# Patient Record
Sex: Male | Born: 1945
Health system: Southern US, Community
[De-identification: ages and names within clinical notes are randomized; demographics above are authoritative.]

## PROBLEM LIST (undated history)

## (undated) DIAGNOSIS — I499 Cardiac arrhythmia, unspecified: Secondary | ICD-10-CM

## (undated) DIAGNOSIS — C4491 Basal cell carcinoma of skin, unspecified: Secondary | ICD-10-CM

## (undated) DIAGNOSIS — E119 Type 2 diabetes mellitus without complications: Secondary | ICD-10-CM

## (undated) DIAGNOSIS — I4891 Unspecified atrial fibrillation: Secondary | ICD-10-CM

## (undated) DIAGNOSIS — I10 Essential (primary) hypertension: Secondary | ICD-10-CM

## (undated) DIAGNOSIS — E785 Hyperlipidemia, unspecified: Secondary | ICD-10-CM

## (undated) HISTORY — DX: Basal cell carcinoma of skin, unspecified: C44.91

## (undated) HISTORY — PX: NASAL RECONSTRUCTION: SHX2069

## (undated) HISTORY — PX: WISDOM TOOTH EXTRACTION: SHX21

## (undated) HISTORY — DX: Essential (primary) hypertension: I10

## (undated) HISTORY — DX: Hyperlipidemia, unspecified: E78.5

## (undated) HISTORY — PX: BASAL CELL CARCINOMA EXCISION: SHX1214

## (undated) HISTORY — PX: INGUINAL HERNIA REPAIR: SUR1180

## (undated) HISTORY — DX: Type 2 diabetes mellitus without complications: E11.9

---

## 1998-09-16 ENCOUNTER — Ambulatory Visit (HOSPITAL_BASED_OUTPATIENT_CLINIC_OR_DEPARTMENT_OTHER): Admission: RE | Admit: 1998-09-16 | Discharge: 1998-09-16 | Payer: Self-pay | Admitting: Surgery

## 2004-02-26 ENCOUNTER — Ambulatory Visit (HOSPITAL_COMMUNITY): Admission: RE | Admit: 2004-02-26 | Discharge: 2004-02-26 | Payer: Self-pay | Admitting: Internal Medicine

## 2004-02-29 ENCOUNTER — Ambulatory Visit (HOSPITAL_COMMUNITY): Admission: RE | Admit: 2004-02-29 | Discharge: 2004-02-29 | Payer: Self-pay | Admitting: Internal Medicine

## 2004-07-03 ENCOUNTER — Ambulatory Visit (HOSPITAL_COMMUNITY): Admission: RE | Admit: 2004-07-03 | Discharge: 2004-07-03 | Payer: Self-pay | Admitting: Internal Medicine

## 2005-04-15 ENCOUNTER — Encounter (HOSPITAL_COMMUNITY): Admission: RE | Admit: 2005-04-15 | Discharge: 2005-05-15 | Payer: Self-pay | Admitting: Neurosurgery

## 2006-08-23 ENCOUNTER — Ambulatory Visit (HOSPITAL_COMMUNITY): Admission: RE | Admit: 2006-08-23 | Discharge: 2006-08-23 | Payer: Self-pay | Admitting: Unknown Physician Specialty

## 2006-08-26 ENCOUNTER — Observation Stay (HOSPITAL_COMMUNITY): Admission: AD | Admit: 2006-08-26 | Discharge: 2006-08-27 | Payer: Self-pay | Admitting: Neurosurgery

## 2006-12-14 HISTORY — PX: LUMBAR FUSION: SHX111

## 2006-12-27 ENCOUNTER — Ambulatory Visit (HOSPITAL_COMMUNITY): Admission: RE | Admit: 2006-12-27 | Discharge: 2006-12-27 | Payer: Self-pay | Admitting: Neurosurgery

## 2007-06-02 ENCOUNTER — Encounter: Admission: RE | Admit: 2007-06-02 | Discharge: 2007-06-02 | Payer: Self-pay | Admitting: Neurosurgery

## 2007-06-15 ENCOUNTER — Ambulatory Visit (HOSPITAL_COMMUNITY): Admission: RE | Admit: 2007-06-15 | Discharge: 2007-06-15 | Payer: Self-pay | Admitting: Family Medicine

## 2007-07-11 ENCOUNTER — Inpatient Hospital Stay (HOSPITAL_COMMUNITY): Admission: RE | Admit: 2007-07-11 | Discharge: 2007-07-15 | Payer: Self-pay | Admitting: Neurosurgery

## 2007-10-07 IMAGING — CR DG LUMBAR SPINE 2-3V
1 series · 1 of 1 positions shown · non-contrast
Comparison: none

CLINICAL DATA: L4-5 herniated nucleus pulposus.
 LUMBAR SPINE ? 2 LATERAL INTRAOPERATIVE IMAGES 07/11/07 AT 1040 HOURS:

[view not recorded]
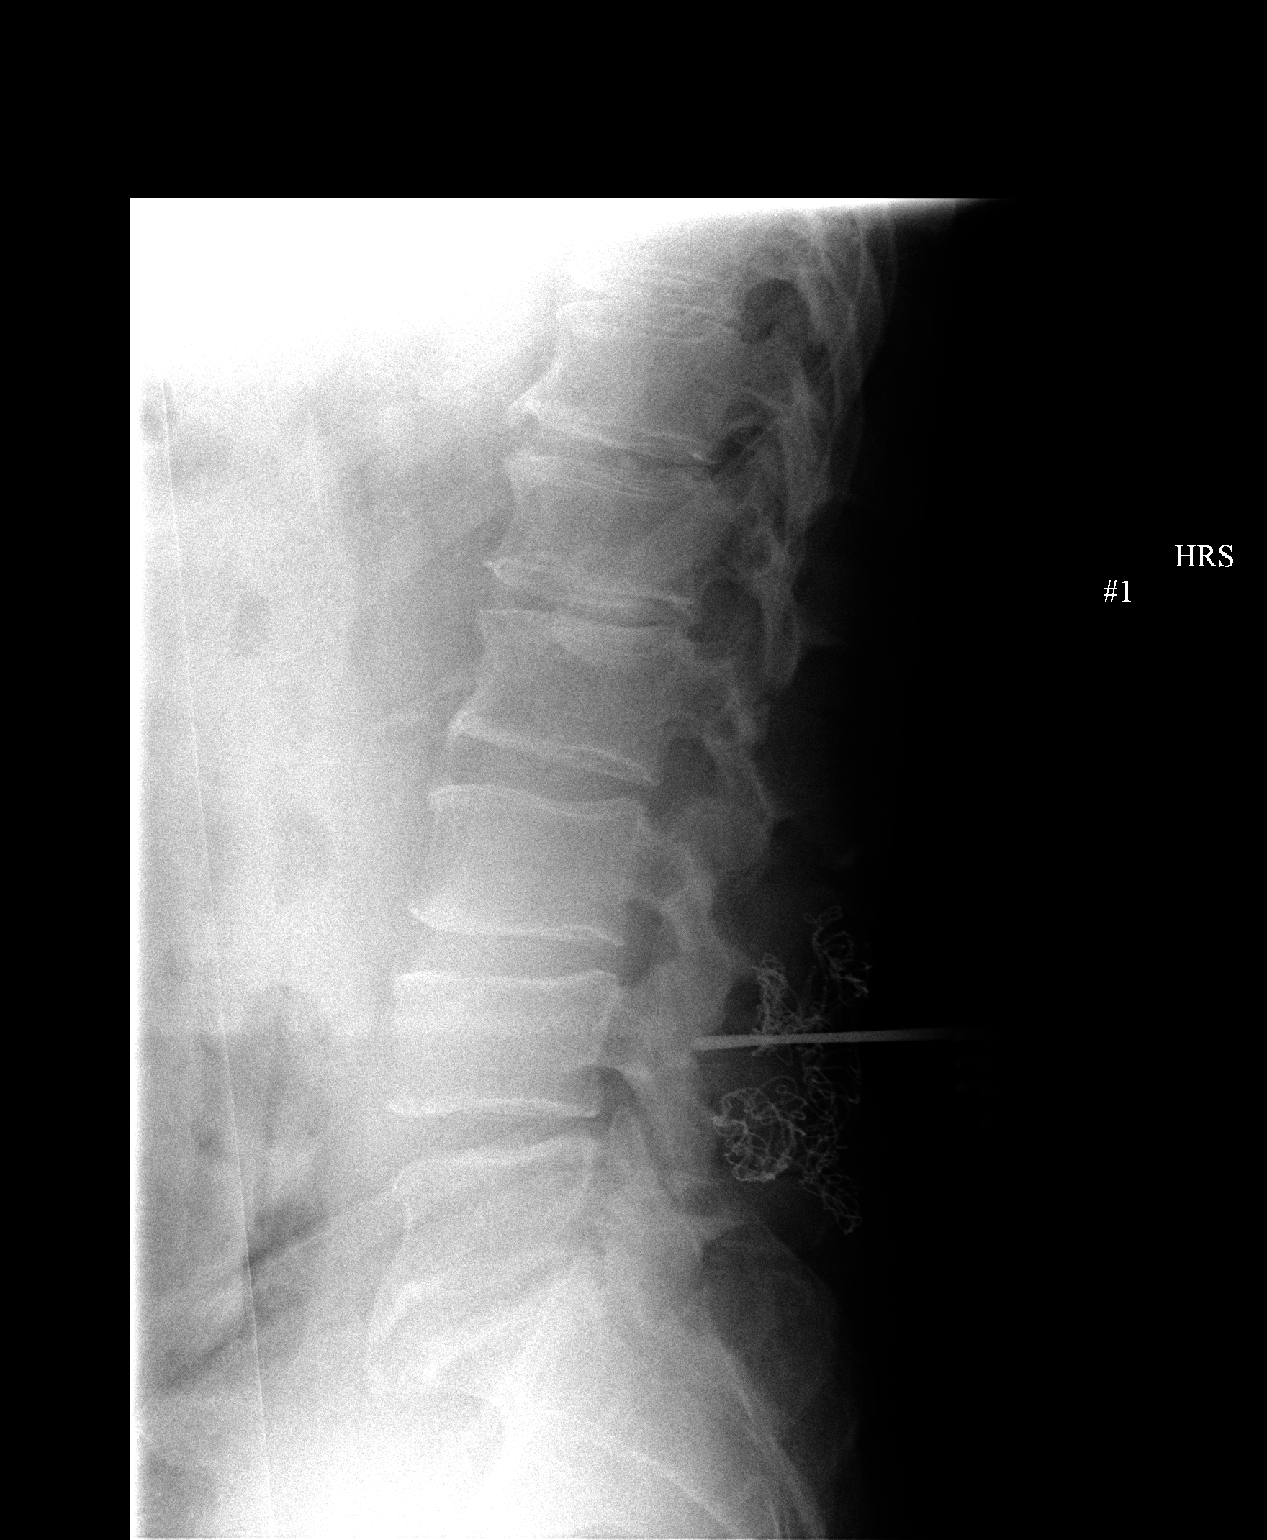

[1 of 1 positions shown; findings below may reference images not displayed]

FINDINGS: On the first film, a surgical instrument has been utilized to marked the L4 pedicle.  
 On the second image, the inferior articular facets of L4 and L5 have been marked by two surgical instruments.  A lap sponge is present in the operative bed.
IMPRESSION: Intraoperative lateral spine as described.

## 2011-04-28 NOTE — Op Note (Signed)
NAME:  Max Davenport, Max Davenport NO.:  [id number]   MEDICAL RECORD NO.:  [id number]          PATIENT TYPE:  INP   LOCATION:  3006                         FACILITY:  [facility]   PHYSICIAN:  Hewitt Shorts, M.D.DATE OF BIRTH:  1946/10/07   DATE OF PROCEDURE:  07/11/2007  DATE OF DISCHARGE:                               OPERATIVE REPORT   PREOPERATIVE DIAGNOSES:  1. Recurrent right L4-5 lumbar disk herniation.  2. Lumbar degenerative disk disease.  3. Lumbar spondylosis.  4. Lumbar radiculopathy.   POSTOPERATIVE DIAGNOSIS:  1. Recurrent right L4-5 lumbar disk herniation.  2. Lumbar degenerative disk disease.  3. Lumbar spondylosis.  4. Lumbar radiculopathy.   PROCEDURE:  Bilateral L4-5 lumbar decompression via lumbar laminotomy,  facetectomy, foraminotomy, and microdiskectomy with microdissection and  bilateral L4-5 post lumbar interbody fusion with AVS peak interbody  implants and VITOSS with bone marrow aspirate and bilateral L4-5  posterior arthrodesis with radius post instrumentation of VITOSS with  bone marrow aspirate.   SURGEON:  Nudelman.   ASSISTANTWebb Silversmith, NP.   ANESTHESIA:  General endotracheal.   INDICATIONS:  Patient is a 65 year old man who presented with recurrent  right L4-5 lumbar disk herniation.  His previous diskectomy was 10  months ago.  We discussed options for microdiskectomy versus  decompression microdiskectomy and arthrodesis.  The patient selected the  lateral approach.   PROCEDURE:  Patient was brought to the operating room and placed under  general endotracheal anesthesia.  Patient was turned to a prone  position.  The lumbar region was prepped with Betadine and saline  solution, draped in a sterile fashion.  The midline was infiltrated with  local anesthetic with epinephrine.  The previous midline incision was  reopened and dissection was carried down through the subcutaneous tissue  using bipolar cautery.  Electrocautery was used  to maintain hemostasis.  Dissection was carried down to the lumbar fascia, which was incised  bilaterally, and the paraspinal muscles were dissected from the spinous  process of the lamina in a subperiosteal fashion.  The L4-5 level was  identified with a x-ray.  Scar tissue within the right L4-5 laminotomy  was noted.  We exposed the left side, including the lamina and  intralaminar space at L4-5 and then laterally over the L4-5 facet  complex, exposing the L4 and L5 transverse processes.  Using  microdissection microsurgical technique with magnification, we carefully  dissected the scar tissue on the right side from the previous  laminectomy defect and then extended the laminotomy rostrally and then  bilateral facetectomies were performed and the neural foramina  decompressed.  We exposed the annulus of the L4-5 disk bilaterally.  As  we freed up the scar tissue in the lateral epidural space and dura and  L4-5, we began to encounter some free fragment disk herniation and this  was progressively mobilized using a microhook and pituitary rongeur.  Then we removed several large fragments of disk that had herniated free  into the epidural space.  We then continued the diskectomy bilaterally  at L4-5 using a variety of microcurets and paddle  rongeurs to prepare  the end plate surfaces, removing the cartilaginous surface, exposing  bony surface bilaterally.  We measured the height of the intervertebral  disk spaces and selected 9 mm implants.  We then exposed the transverse  processes of L4 and L5 on the right side and then probed the left L5  pedicle.  We aspirated 15 ml of bone marrow aspirate, injected over 10  ml triple VITOSS.  We then packed 9 mm in height AVS peak interbody  implants with the VITOSS bone marrow aspirate and then carefully  retracted the thecal sac and nerve root.  We placed the first implant on  the left side.  It was countersunk.  We packed additional VITOSS with   bone marrow aspirate around it and then we carefully retracted the  thecal sac and nerve root on the right side, placing the second implant  on the right side.  We then packed in additional VITOSS with bone marrow  aspirate lateral to that in the intervertebral disk space.  Once this  was completed, the C-arm fluoroscope was draped and brought into the  field to provide additional guidance in placing the pedicle screws, and  each of the pedicles bilaterally at L4 and L5 were probed with a pedicle  probe, examined with a ball probe.  No cutouts were found.  We then  tapped each of the pedicles with a 5.25 mm tap.  Again, examined them  with a ball probe.  No cutouts were found.  We placed 5.75 mm screws,  placing 45 mm screws at L5 and 50 mm screws at L4.  One AP view was  taken.  It was felt that the screws on the left side were too medial  and, therefore, they were removed.  Again using the C-arm fluoroscope,  we selected a more lateral entry point into the pedicle.  The pedicle  was again probed, examined with a ball probe, tapped, again examined  with a ball probe and then the screws placed.  It was felt to be better  positioned within the pedicle.  We then selected 30 mm pre-lordosed  rods.  They were positioned in the screw heads and locked down with  locking caps into position.  Subsequently, tightening was again  encountered, torqued.  We then decorticated the transverse processes at  L4 and L5 bilaterally and packed the additional VITOSS with bone marrow  aspirate over the transverse processes and intertransverse space.  We  then, once again, examined the spinal canal, thecal sac, and nerve roots  to insure that they were well decompressed.  The wound was irrigated  numerous times during the procedure, first with saline solution and  subsequently with bacitracin solution.  Then we proceeded with closure.  The deep fascia was closed with undyed 1 Vicryl sutures, Scarpa's fascia   closed with interrupted inverted undyed 1 Vicryl sutures.  The  subcutaneous subcuticular layer closed with inverted 2-0 undyed Vicryl  sutures and the skin edges were closed with surgical staples.  The wound  was dressed with Dacron and sterile gauze.  Procedure was tolerated  well.  The estimated blood loss was 400 ml.  The sponge and needle count  was correct.  We did use a Cell Saver during the procedure, but the Cell  Saver technician and CRNA felt there was insufficient blood loss to  spend down the collective specimen.  Patient was subsequently turned  back to supine position, reversed the anesthetic, extubated, and  transferred  to the recovery room for further care.      Hewitt Shorts, M.D.  Electronically Signed     RWN/MEDQ  D:  07/11/2007  T:  07/11/2007  Job:  [id number]   cc:   Hewitt Shorts, M.D.

## 2011-04-28 NOTE — H&P (Signed)
NAME:  Max Davenport, Max Davenport NO.:  [id number]   MEDICAL RECORD NO.:  [id number]          PATIENT TYPE:  INP   LOCATION:  3172                         FACILITY:  [facility]   PHYSICIAN:  Hewitt Shorts, M.D.DATE OF BIRTH:  1946/08/07   DATE OF ADMISSION:  07/11/2007  DATE OF DISCHARGE:                              HISTORY & PHYSICAL   HISTORY OF PRESENT ILLNESS:  The patient is a 65 year old right-handed  white male who has been a patient of mine since August 2005.  He is  status post a right L5-S1 lumbar diskectomy 30 years ago by Dr. Jonne Ply, and status post a right L4-5 lumbar diskectomy that I  performed in September of 2007.  Unfortunately, he has developed  recurrent right lumbar radicular pain radiating from his low back down  to the right buttock, posterior thigh, and calf.  He is treated with  oxycodone, cortisone shot, and Lyrica.  He is reevaluated with MRI scan  that revealed a large recurrent right L4-5 lumbar disk herniation.  We  discussed options for lumbar laminotomy, micro diskectomy for  decompression versus diskectomy and placement of posterior lateral  arthrodesis.  The patient preferred the more definitive diskectomy  decompression and arthrodesis.  The patient was brought to surgery now  for such.   PAST MEDICAL HISTORY:  Notable for a recent boil on his right knee that  was found to be due to methicillin resistant Staph aureus and was  treated by his primary physician, and that infection has resolved.  He  does not describe any history of hypertension, myocardial infarction,  cancer, stroke, diabetes, peptic ulcer disease, or lung disease.   His only previous surgeries were his 2 lumbar diskectomies.   HE DENIES ALLERGIES TO MEDICATIONS.   CURRENT MEDICATIONS:  Oxycodone and Septra.   FAMILY HISTORY:  The patient was adopted and there is no information  regarding his birth parents.   SOCIAL HISTORY:  The patient is a Clinical cytogeneticist at [employer].  He is  unmarried.  He does not smoke, drink, or have any history of substance  abuse.   REVIEW OF SYSTEMS:  Notable for symptoms described in the history of  present illness and past medical history.  Review of systems is  otherwise unremarkable.   PHYSICAL EXAMINATION:  GENERAL:  The patient is a well-developed, well-  nourished, white male in no acute distress.  VITAL SIGNS:  His temperature is 97.7, pulse is 76, blood pressure is  137/88, respiratory rate 18, height 5 foot 9 inches, weight 205 pounds.  LUNGS:  Clear to auscultation.  He has symmetrical respiratory  excursion.  HEART:  Regular rate and rhythm.  Normal S1, S2.  There is no murmurs.  ABDOMEN:  Soft, nondistended.  Bowel sounds are present.  EXTREMITY EXAMINATION:  Shows no clubbing, cyanosis, or edema.  MUSCULOSKELETAL EXAMINATION:  Shows tenderness to palpation in the right  paralumbar region, not on the left paralumbar region.  Kernig leg  raising is positive on the right at 70 degrees.  Straight leg raising is  positive.  Positive  cross straight leg raising on the left at about 40-  50  degrees.  NEUROLOGIC EXAMINATION:  Shows 5/5 strength through the lower  extremities including the iliopsoas, quadriceps, dorsiflexor, extensor  hallucis longus and plantar flexor bilaterally.  Sensation is intact to  pinprick in the lower extremities.  Reflexes are 1 to 2+ in the  quadriceps, trace in the gastrocnemius, they are  symmetrical  bilaterally.  Toes downgoing.  Gait and stance favors the right lower  extremity.   IMPRESSION:  Acute right lumbar radiculopathy.  Recurrent  right L4-5  disk herniation.   PLAN:  The patient will be admitted for a bilateral L4-5 lumbar  decompression and posterior arthrodesis with interbody implants, bone  grafting plus instrumentation bone graft.  We discussed alternative  surgeries, alternatives to surgery all together, and the nature of  surgery, typicals of  surgery, hospital stay, and recuperation  and  limitations postoperatively, need for possible immobilization in a  lumbar corset and risks of surgery including risks of infection,  bleeding, possible need for transfusion, risk of nerve disorder, pain,  weakness, numbness or paresthesias, the risk of dural tear and CSF  leakage,  possible need for further surgery, the risk of failure of the  arthrodesis, and anesthetic risks including the possibility of  myocardial infarction, stroke, pneumonia, and death.  After discussing  all this, he wishes to go ahead with surgery and is admitted now for  such.   Preoperatively the patient was treated with a Hibiclens shower last  night and again this morning.  He also used Bactroban nasal  ointment  b.i.d. for the past 5 days.  He will be treated with vancomycin  perioperatively.      Hewitt Shorts, M.D.  Electronically Signed     RWN/MEDQ  D:  07/11/2007  T:  07/11/2007  Job:  [id number]

## 2011-04-28 NOTE — Discharge Summary (Signed)
NAME:  [name], [name] NO.:  [id number]   MEDICAL RECORD NO.:  [id number]          PATIENT TYPE:  INP   LOCATION:  3006                         FACILITY:  [facility]   PHYSICIAN:  Max Davenport, M.D.DATE OF BIRTH:  [birth year]   DATE OF ADMISSION:  07/11/2007  DATE OF DISCHARGE:  07/15/2007                               DISCHARGE SUMMARY   ADMISSION HISTORY AND PHYSICAL EXAMINATION:  The patient is a 65-year-  old man with recurrent right L4-5 lumbar disk herniation.  He developed  recurrent radicular pain and was admitted for lumbar decompression,  diskectomy and arthrodesis.  General examination was unremarkable.  Neurologic examination showed good strength and sensation.   HOSPITAL COURSE:  Patient was admitted.  Underwent L4-5 lumbar  laminotomy, facetectomy, microdiskectomy, posterior lumbar interbody  fusion with PEEK interbody implants and posterolateral arthrodesis with  posterior instrumentation bone graft.  Postoperatively, he has done  well.  He had the persistent drainage from the superior aspect of his  incision for a couple of days.  However, at this point his wound has  healed up.  There is no further drainage.  There is no erythema.  There  is never any purulence to the drainage.  His wound was stapled closed  and he has been given instructions regarding wound care and activities.   DISCHARGE INSTRUCTIONS:  He is to return to our office in five days for  staple removal.  Discharge prescriptions were given for Percocet one or  two q.4-6h. p.r.n. pain, 60 tabs prescribed, no refills, and Flexeril 10  mg q.8h. p.r.n. spasm, 90 tablets prescribed, 1 refill.   DISCHARGE DIAGNOSES:  Recurrent lumbar disk herniation, lumbar  degenerative disk disease, lumbar spondylosis and lumbar radiculopathy.      Max Davenport, M.D.  Electronically Signed     RWN/MEDQ  D:  07/15/2007  T:  07/15/2007  Job:  [id number]

## 2011-05-01 NOTE — Op Note (Signed)
NAME:  BROADY, LAFOY NO.:  [id number]   MEDICAL RECORD NO.:  [id number]          PATIENT TYPE:  INP   LOCATION:  5707                         FACILITY:  [facility]   PHYSICIAN:  Hewitt Shorts, M.D.DATE OF BIRTH:  Oct 03, 1946   DATE OF PROCEDURE:  DATE OF DISCHARGE:                                 OPERATIVE REPORT   PREOPERATIVE DIAGNOSIS:  Right L4-5 lumbar degenerative disk disease.  Lumbar spondylosis, lumbar stenosis and lumbar radiculopathy.   POSTOPERATIVE DIAGNOSIS:  Right L4-5 lumbar degenerative disk disease.  Lumbar spondylosis, lumbar stenosis and lumbar radiculopathy.   PROCEDURE:  Right L4-5 lumbar laminotomy and microdiskectomy with  microdissection.   SURGEON:  Hewitt Shorts, M.D.   ASSISTANT:  Lovell Sheehan   ANESTHESIA:  General endotracheal.   INDICATIONS FOR PROCEDURE:  The patient is a 65 year old man who presented  with an acute right lumbar radiculopathy and MRI revealed a right L4-5  lumbar disk herniation with significant thecal sac and nerve root  compression.  Decision was made to proceed with laminotomy and  microdiskectomy.   PROCEDURE:  The patient was brought to the operating room and placed under  general endotracheal intubation.  The patient was turned to prone position.  Lumbar region was prepped with Betadine soap and solution and draped in  sterile fashion.  The previous midline incision was infiltrated with local  anesthetic with epinephrine and x-ray was taken to localize the L4-5 level  and the incision was made over the L4-5 level and carried down through the  subcutaneous tissue.  Bipolar cautery and electrocautery was used to  maintain hemostasis.  Dissection was carried down to the lumbar fascia which  was incised on the right side of the midline of the paraspinal muscles with  dissection of the spinous process of the lamina, subperiosteal fashion.  A  self-retaining retractor was placed and an x-ray was taken to  identify the  L4-5 level and x-ray was taken to confirm the localization and then the  microscope was draped and brought into the field to provide additional  magnification, illumination and visualization.  The remainder of the  decompression was performed using microdissection and microsurgical  technique.  Laminotomy was performed using the X-MAX drill and Kerrison  punches.  The ligamentum flavum was carefully removed and we identified the  thecal sac and exiting right L5 nerve root.  These structures were then just  gently retracted medially and we exposed significant subligamentous disk  herniation.  The remaining annular fibers were incised and the discectomy  was performed using a variety of pituitary rongeurs.  Good decompression of  the thecal sac and nerve root was achieved and all disk fragments and disk  material were removed.  Once the diskectomy was completed, hemostasis was  established with the use of bipolar cautery.  We then instilled 2 mL of  fentanyl, 80 mg of Depo-Medrol into the epidural space and then proceeded  with closure.  Deep fascia was closed with interrupted #1 Vicryl sutures.  Scarpa's fascia was closed with interrupted and inverted 2-0 undyed Vicryl  suture and  the subcutaneous and subcuticular was closed with inverted 2-0  and 3-0 interrupted Vicryl sutures.  The skin was  approximated with Dermabond.  The procedure was tolerated well.  The  estimated blood loss was less than 25 mL.  Sponge count was correct.  Following surgery the patient was turned back to the supine position to be  reversed anesthetic, extubated and transferred to the recovery room for  further care.      Hewitt Shorts, M.D.  Electronically Signed     RWN/MEDQ  D:  08/26/2006  T:  08/27/2006  Job:  [id number]

## 2011-09-28 LAB — BASIC METABOLIC PANEL
BUN: 11
CO2: 27
Calcium: 9.1
Chloride: 100
Creatinine, Ser: 0.8
GFR calc Af Amer: >60
GFR calc non Af Amer: >60
Glucose, Bld: 82
Potassium: 4.2
Sodium: 134 — ABNORMAL LOW

## 2011-09-28 LAB — URINALYSIS, ROUTINE W REFLEX MICROSCOPIC
Bilirubin Urine: NEGATIVE
Glucose, UA: NEGATIVE
Hgb urine dipstick: NEGATIVE
Ketones, ur: NEGATIVE
Nitrite: NEGATIVE
Protein, ur: NEGATIVE
Specific Gravity, Urine: 1.014
Urobilinogen, UA: 0.2
pH: 7.5

## 2011-09-28 LAB — CBC
HCT: 48.2
Hemoglobin: 16.6
MCHC: 34.5
MCV: 95.4
Platelets: 245
RBC: 5.05
RDW: 12.3
WBC: 6.9

## 2011-09-28 LAB — URINE CULTURE
Colony Count: NO GROWTH
Culture: NO GROWTH
Special Requests: NEGATIVE

## 2011-09-28 LAB — TYPE AND SCREEN
ABO/RH(D): O POS
Antibody Screen: NEGATIVE

## 2011-09-28 LAB — ABO/RH: ABO/RH(D): O POS

## 2011-12-21 DIAGNOSIS — L570 Actinic keratosis: Secondary | ICD-10-CM | POA: Diagnosis not present

## 2011-12-21 DIAGNOSIS — C4441 Basal cell carcinoma of skin of scalp and neck: Secondary | ICD-10-CM | POA: Diagnosis not present

## 2011-12-21 DIAGNOSIS — D235 Other benign neoplasm of skin of trunk: Secondary | ICD-10-CM | POA: Diagnosis not present

## 2012-02-16 DIAGNOSIS — Z85828 Personal history of other malignant neoplasm of skin: Secondary | ICD-10-CM | POA: Diagnosis not present

## 2012-08-22 DIAGNOSIS — Z23 Encounter for immunization: Secondary | ICD-10-CM | POA: Diagnosis not present

## 2012-09-12 DIAGNOSIS — E782 Mixed hyperlipidemia: Secondary | ICD-10-CM | POA: Diagnosis not present

## 2012-09-12 DIAGNOSIS — R5383 Other fatigue: Secondary | ICD-10-CM | POA: Diagnosis not present

## 2012-09-12 DIAGNOSIS — E669 Obesity, unspecified: Secondary | ICD-10-CM | POA: Diagnosis not present

## 2012-09-12 DIAGNOSIS — I10 Essential (primary) hypertension: Secondary | ICD-10-CM | POA: Diagnosis not present

## 2012-09-21 DIAGNOSIS — L989 Disorder of the skin and subcutaneous tissue, unspecified: Secondary | ICD-10-CM | POA: Diagnosis not present

## 2012-09-27 ENCOUNTER — Other Ambulatory Visit (HOSPITAL_COMMUNITY)
Admission: RE | Admit: 2012-09-27 | Discharge: 2012-09-27 | Disposition: A | Discharge status: Home / Self Care | Payer: Medicare Other | Source: Ambulatory Visit | Attending: General Surgery | Admitting: General Surgery

## 2012-09-27 DIAGNOSIS — C4441 Basal cell carcinoma of skin of scalp and neck: Secondary | ICD-10-CM | POA: Diagnosis not present

## 2012-09-27 DIAGNOSIS — L989 Disorder of the skin and subcutaneous tissue, unspecified: Secondary | ICD-10-CM | POA: Diagnosis not present

## 2012-10-11 DIAGNOSIS — C4441 Basal cell carcinoma of skin of scalp and neck: Secondary | ICD-10-CM | POA: Diagnosis not present

## 2012-10-12 DIAGNOSIS — I10 Essential (primary) hypertension: Secondary | ICD-10-CM | POA: Diagnosis not present

## 2012-10-12 DIAGNOSIS — E782 Mixed hyperlipidemia: Secondary | ICD-10-CM | POA: Diagnosis not present

## 2012-10-12 DIAGNOSIS — R946 Abnormal results of thyroid function studies: Secondary | ICD-10-CM | POA: Diagnosis not present

## 2012-10-12 DIAGNOSIS — E669 Obesity, unspecified: Secondary | ICD-10-CM | POA: Diagnosis not present

## 2012-10-27 DIAGNOSIS — J019 Acute sinusitis, unspecified: Secondary | ICD-10-CM | POA: Diagnosis not present

## 2012-11-09 DIAGNOSIS — J019 Acute sinusitis, unspecified: Secondary | ICD-10-CM | POA: Diagnosis not present

## 2012-11-09 DIAGNOSIS — I10 Essential (primary) hypertension: Secondary | ICD-10-CM | POA: Diagnosis not present

## 2012-12-28 DIAGNOSIS — I10 Essential (primary) hypertension: Secondary | ICD-10-CM | POA: Diagnosis not present

## 2012-12-28 DIAGNOSIS — E785 Hyperlipidemia, unspecified: Secondary | ICD-10-CM | POA: Diagnosis not present

## 2012-12-28 DIAGNOSIS — E291 Testicular hypofunction: Secondary | ICD-10-CM | POA: Diagnosis not present

## 2012-12-28 DIAGNOSIS — Z125 Encounter for screening for malignant neoplasm of prostate: Secondary | ICD-10-CM | POA: Diagnosis not present

## 2012-12-28 DIAGNOSIS — R946 Abnormal results of thyroid function studies: Secondary | ICD-10-CM | POA: Diagnosis not present

## 2012-12-28 DIAGNOSIS — E782 Mixed hyperlipidemia: Secondary | ICD-10-CM | POA: Diagnosis not present

## 2013-01-10 DIAGNOSIS — C4441 Basal cell carcinoma of skin of scalp and neck: Secondary | ICD-10-CM | POA: Diagnosis not present

## 2013-01-17 DIAGNOSIS — D45 Polycythemia vera: Secondary | ICD-10-CM | POA: Diagnosis not present

## 2013-01-17 DIAGNOSIS — R7301 Impaired fasting glucose: Secondary | ICD-10-CM | POA: Diagnosis not present

## 2013-03-27 DIAGNOSIS — E119 Type 2 diabetes mellitus without complications: Secondary | ICD-10-CM | POA: Diagnosis not present

## 2013-04-04 DIAGNOSIS — I10 Essential (primary) hypertension: Secondary | ICD-10-CM | POA: Diagnosis not present

## 2013-04-04 DIAGNOSIS — E119 Type 2 diabetes mellitus without complications: Secondary | ICD-10-CM | POA: Diagnosis not present

## 2013-04-04 DIAGNOSIS — E782 Mixed hyperlipidemia: Secondary | ICD-10-CM | POA: Diagnosis not present

## 2013-09-18 DIAGNOSIS — Z23 Encounter for immunization: Secondary | ICD-10-CM | POA: Diagnosis not present

## 2013-12-13 DIAGNOSIS — J069 Acute upper respiratory infection, unspecified: Secondary | ICD-10-CM | POA: Diagnosis not present

## 2014-10-05 DIAGNOSIS — Z23 Encounter for immunization: Secondary | ICD-10-CM | POA: Diagnosis not present

## 2015-08-21 DIAGNOSIS — I10 Essential (primary) hypertension: Secondary | ICD-10-CM | POA: Diagnosis not present

## 2015-09-03 DIAGNOSIS — I10 Essential (primary) hypertension: Secondary | ICD-10-CM | POA: Diagnosis not present

## 2015-09-03 DIAGNOSIS — Z125 Encounter for screening for malignant neoplasm of prostate: Secondary | ICD-10-CM | POA: Diagnosis not present

## 2015-09-03 DIAGNOSIS — E785 Hyperlipidemia, unspecified: Secondary | ICD-10-CM | POA: Diagnosis not present

## 2015-09-03 DIAGNOSIS — R7301 Impaired fasting glucose: Secondary | ICD-10-CM | POA: Diagnosis not present

## 2015-09-06 DIAGNOSIS — I10 Essential (primary) hypertension: Secondary | ICD-10-CM | POA: Diagnosis not present

## 2015-09-06 DIAGNOSIS — E782 Mixed hyperlipidemia: Secondary | ICD-10-CM | POA: Diagnosis not present

## 2015-09-06 DIAGNOSIS — R7301 Impaired fasting glucose: Secondary | ICD-10-CM | POA: Diagnosis not present

## 2015-09-27 DIAGNOSIS — R110 Nausea: Secondary | ICD-10-CM | POA: Diagnosis not present

## 2015-09-27 DIAGNOSIS — J069 Acute upper respiratory infection, unspecified: Secondary | ICD-10-CM | POA: Diagnosis not present

## 2016-03-10 DIAGNOSIS — E291 Testicular hypofunction: Secondary | ICD-10-CM | POA: Diagnosis not present

## 2016-03-10 DIAGNOSIS — E119 Type 2 diabetes mellitus without complications: Secondary | ICD-10-CM | POA: Diagnosis not present

## 2016-03-16 DIAGNOSIS — R7301 Impaired fasting glucose: Secondary | ICD-10-CM | POA: Diagnosis not present

## 2016-03-16 DIAGNOSIS — M545 Low back pain: Secondary | ICD-10-CM | POA: Diagnosis not present

## 2016-03-16 DIAGNOSIS — E782 Mixed hyperlipidemia: Secondary | ICD-10-CM | POA: Diagnosis not present

## 2016-03-16 DIAGNOSIS — I10 Essential (primary) hypertension: Secondary | ICD-10-CM | POA: Diagnosis not present

## 2016-07-24 DIAGNOSIS — E782 Mixed hyperlipidemia: Secondary | ICD-10-CM | POA: Diagnosis not present

## 2016-07-24 DIAGNOSIS — E119 Type 2 diabetes mellitus without complications: Secondary | ICD-10-CM | POA: Diagnosis not present

## 2016-07-24 DIAGNOSIS — E291 Testicular hypofunction: Secondary | ICD-10-CM | POA: Diagnosis not present

## 2016-07-28 DIAGNOSIS — I10 Essential (primary) hypertension: Secondary | ICD-10-CM | POA: Diagnosis not present

## 2016-07-28 DIAGNOSIS — E119 Type 2 diabetes mellitus without complications: Secondary | ICD-10-CM | POA: Diagnosis not present

## 2016-07-28 DIAGNOSIS — E782 Mixed hyperlipidemia: Secondary | ICD-10-CM | POA: Diagnosis not present

## 2016-08-14 ENCOUNTER — Other Ambulatory Visit: Payer: Self-pay

## 2017-04-28 DIAGNOSIS — I10 Essential (primary) hypertension: Secondary | ICD-10-CM | POA: Diagnosis not present

## 2017-04-28 DIAGNOSIS — Z125 Encounter for screening for malignant neoplasm of prostate: Secondary | ICD-10-CM | POA: Diagnosis not present

## 2017-04-28 DIAGNOSIS — E119 Type 2 diabetes mellitus without complications: Secondary | ICD-10-CM | POA: Diagnosis not present

## 2017-04-28 DIAGNOSIS — E291 Testicular hypofunction: Secondary | ICD-10-CM | POA: Diagnosis not present

## 2017-04-30 DIAGNOSIS — I10 Essential (primary) hypertension: Secondary | ICD-10-CM | POA: Diagnosis not present

## 2017-04-30 DIAGNOSIS — E782 Mixed hyperlipidemia: Secondary | ICD-10-CM | POA: Diagnosis not present

## 2017-09-02 DIAGNOSIS — E119 Type 2 diabetes mellitus without complications: Secondary | ICD-10-CM | POA: Diagnosis not present

## 2017-09-02 DIAGNOSIS — I10 Essential (primary) hypertension: Secondary | ICD-10-CM | POA: Diagnosis not present

## 2017-09-06 DIAGNOSIS — E6609 Other obesity due to excess calories: Secondary | ICD-10-CM | POA: Diagnosis not present

## 2017-09-06 DIAGNOSIS — E782 Mixed hyperlipidemia: Secondary | ICD-10-CM | POA: Diagnosis not present

## 2017-09-06 DIAGNOSIS — I10 Essential (primary) hypertension: Secondary | ICD-10-CM | POA: Diagnosis not present

## 2017-09-06 DIAGNOSIS — E119 Type 2 diabetes mellitus without complications: Secondary | ICD-10-CM | POA: Diagnosis not present

## 2017-09-06 DIAGNOSIS — Z6835 Body mass index (BMI) 35.0-35.9, adult: Secondary | ICD-10-CM | POA: Diagnosis not present

## 2017-09-20 DIAGNOSIS — I10 Essential (primary) hypertension: Secondary | ICD-10-CM | POA: Diagnosis not present

## 2017-09-20 DIAGNOSIS — Z6833 Body mass index (BMI) 33.0-33.9, adult: Secondary | ICD-10-CM | POA: Diagnosis not present

## 2017-09-20 DIAGNOSIS — E119 Type 2 diabetes mellitus without complications: Secondary | ICD-10-CM | POA: Diagnosis not present

## 2017-10-13 DIAGNOSIS — J0110 Acute frontal sinusitis, unspecified: Secondary | ICD-10-CM | POA: Diagnosis not present

## 2017-10-13 DIAGNOSIS — R42 Dizziness and giddiness: Secondary | ICD-10-CM | POA: Diagnosis not present

## 2018-01-06 DIAGNOSIS — E6609 Other obesity due to excess calories: Secondary | ICD-10-CM | POA: Diagnosis not present

## 2018-01-06 DIAGNOSIS — R7301 Impaired fasting glucose: Secondary | ICD-10-CM | POA: Diagnosis not present

## 2018-01-06 DIAGNOSIS — I10 Essential (primary) hypertension: Secondary | ICD-10-CM | POA: Diagnosis not present

## 2018-01-06 DIAGNOSIS — E291 Testicular hypofunction: Secondary | ICD-10-CM | POA: Diagnosis not present

## 2018-01-06 DIAGNOSIS — K219 Gastro-esophageal reflux disease without esophagitis: Secondary | ICD-10-CM | POA: Diagnosis not present

## 2018-01-06 DIAGNOSIS — E119 Type 2 diabetes mellitus without complications: Secondary | ICD-10-CM | POA: Diagnosis not present

## 2018-01-06 DIAGNOSIS — E782 Mixed hyperlipidemia: Secondary | ICD-10-CM | POA: Diagnosis not present

## 2018-01-10 DIAGNOSIS — I10 Essential (primary) hypertension: Secondary | ICD-10-CM | POA: Diagnosis not present

## 2018-01-10 DIAGNOSIS — E119 Type 2 diabetes mellitus without complications: Secondary | ICD-10-CM | POA: Diagnosis not present

## 2018-01-10 DIAGNOSIS — E782 Mixed hyperlipidemia: Secondary | ICD-10-CM | POA: Diagnosis not present

## 2018-05-02 DIAGNOSIS — E119 Type 2 diabetes mellitus without complications: Secondary | ICD-10-CM | POA: Diagnosis not present

## 2018-05-02 DIAGNOSIS — Z125 Encounter for screening for malignant neoplasm of prostate: Secondary | ICD-10-CM | POA: Diagnosis not present

## 2018-05-02 DIAGNOSIS — Z79899 Other long term (current) drug therapy: Secondary | ICD-10-CM | POA: Diagnosis not present

## 2018-05-02 DIAGNOSIS — N429 Disorder of prostate, unspecified: Secondary | ICD-10-CM | POA: Diagnosis not present

## 2018-05-02 DIAGNOSIS — E782 Mixed hyperlipidemia: Secondary | ICD-10-CM | POA: Diagnosis not present

## 2018-05-02 DIAGNOSIS — D519 Vitamin B12 deficiency anemia, unspecified: Secondary | ICD-10-CM | POA: Diagnosis not present

## 2018-05-04 DIAGNOSIS — Z0000 Encounter for general adult medical examination without abnormal findings: Secondary | ICD-10-CM | POA: Diagnosis not present

## 2018-05-04 DIAGNOSIS — I10 Essential (primary) hypertension: Secondary | ICD-10-CM | POA: Diagnosis not present

## 2018-05-04 DIAGNOSIS — E782 Mixed hyperlipidemia: Secondary | ICD-10-CM | POA: Diagnosis not present

## 2018-05-04 DIAGNOSIS — E119 Type 2 diabetes mellitus without complications: Secondary | ICD-10-CM | POA: Diagnosis not present

## 2018-09-02 DIAGNOSIS — E782 Mixed hyperlipidemia: Secondary | ICD-10-CM | POA: Diagnosis not present

## 2018-09-02 DIAGNOSIS — E119 Type 2 diabetes mellitus without complications: Secondary | ICD-10-CM | POA: Diagnosis not present

## 2018-09-02 DIAGNOSIS — E291 Testicular hypofunction: Secondary | ICD-10-CM | POA: Diagnosis not present

## 2018-09-02 DIAGNOSIS — I10 Essential (primary) hypertension: Secondary | ICD-10-CM | POA: Diagnosis not present

## 2018-09-06 DIAGNOSIS — Z0000 Encounter for general adult medical examination without abnormal findings: Secondary | ICD-10-CM | POA: Diagnosis not present

## 2018-09-06 DIAGNOSIS — E1165 Type 2 diabetes mellitus with hyperglycemia: Secondary | ICD-10-CM | POA: Diagnosis not present

## 2018-09-06 DIAGNOSIS — I10 Essential (primary) hypertension: Secondary | ICD-10-CM | POA: Diagnosis not present

## 2018-09-06 DIAGNOSIS — Z6834 Body mass index (BMI) 34.0-34.9, adult: Secondary | ICD-10-CM | POA: Diagnosis not present

## 2018-09-06 DIAGNOSIS — E782 Mixed hyperlipidemia: Secondary | ICD-10-CM | POA: Diagnosis not present

## 2018-11-04 DIAGNOSIS — Z23 Encounter for immunization: Secondary | ICD-10-CM | POA: Diagnosis not present

## 2019-01-09 DIAGNOSIS — I10 Essential (primary) hypertension: Secondary | ICD-10-CM | POA: Diagnosis not present

## 2019-01-09 DIAGNOSIS — E1165 Type 2 diabetes mellitus with hyperglycemia: Secondary | ICD-10-CM | POA: Diagnosis not present

## 2019-01-09 DIAGNOSIS — E291 Testicular hypofunction: Secondary | ICD-10-CM | POA: Diagnosis not present

## 2019-01-09 DIAGNOSIS — R7301 Impaired fasting glucose: Secondary | ICD-10-CM | POA: Diagnosis not present

## 2019-01-09 DIAGNOSIS — E782 Mixed hyperlipidemia: Secondary | ICD-10-CM | POA: Diagnosis not present

## 2019-01-09 DIAGNOSIS — E119 Type 2 diabetes mellitus without complications: Secondary | ICD-10-CM | POA: Diagnosis not present

## 2019-01-09 DIAGNOSIS — Z6834 Body mass index (BMI) 34.0-34.9, adult: Secondary | ICD-10-CM | POA: Diagnosis not present

## 2019-01-13 DIAGNOSIS — E6609 Other obesity due to excess calories: Secondary | ICD-10-CM | POA: Diagnosis not present

## 2019-01-13 DIAGNOSIS — Z6835 Body mass index (BMI) 35.0-35.9, adult: Secondary | ICD-10-CM | POA: Diagnosis not present

## 2019-01-13 DIAGNOSIS — E1165 Type 2 diabetes mellitus with hyperglycemia: Secondary | ICD-10-CM | POA: Diagnosis not present

## 2019-01-13 DIAGNOSIS — I10 Essential (primary) hypertension: Secondary | ICD-10-CM | POA: Diagnosis not present

## 2019-01-13 DIAGNOSIS — E782 Mixed hyperlipidemia: Secondary | ICD-10-CM | POA: Diagnosis not present

## 2019-02-01 DIAGNOSIS — I10 Essential (primary) hypertension: Secondary | ICD-10-CM | POA: Diagnosis not present

## 2019-02-01 DIAGNOSIS — E782 Mixed hyperlipidemia: Secondary | ICD-10-CM | POA: Diagnosis not present

## 2019-02-01 DIAGNOSIS — E1165 Type 2 diabetes mellitus with hyperglycemia: Secondary | ICD-10-CM | POA: Diagnosis not present

## 2019-02-21 DIAGNOSIS — J069 Acute upper respiratory infection, unspecified: Secondary | ICD-10-CM | POA: Diagnosis not present

## 2019-04-12 DIAGNOSIS — E782 Mixed hyperlipidemia: Secondary | ICD-10-CM | POA: Diagnosis not present

## 2019-04-12 DIAGNOSIS — E1165 Type 2 diabetes mellitus with hyperglycemia: Secondary | ICD-10-CM | POA: Diagnosis not present

## 2019-04-12 DIAGNOSIS — I10 Essential (primary) hypertension: Secondary | ICD-10-CM | POA: Diagnosis not present

## 2019-04-20 DIAGNOSIS — E1165 Type 2 diabetes mellitus with hyperglycemia: Secondary | ICD-10-CM | POA: Diagnosis not present

## 2019-04-20 DIAGNOSIS — E782 Mixed hyperlipidemia: Secondary | ICD-10-CM | POA: Diagnosis not present

## 2019-04-20 DIAGNOSIS — I10 Essential (primary) hypertension: Secondary | ICD-10-CM | POA: Diagnosis not present

## 2019-05-17 DIAGNOSIS — I10 Essential (primary) hypertension: Secondary | ICD-10-CM | POA: Diagnosis not present

## 2019-05-17 DIAGNOSIS — E782 Mixed hyperlipidemia: Secondary | ICD-10-CM | POA: Diagnosis not present

## 2019-05-17 DIAGNOSIS — E1165 Type 2 diabetes mellitus with hyperglycemia: Secondary | ICD-10-CM | POA: Diagnosis not present

## 2019-06-05 DIAGNOSIS — I10 Essential (primary) hypertension: Secondary | ICD-10-CM | POA: Diagnosis not present

## 2019-06-05 DIAGNOSIS — Z6834 Body mass index (BMI) 34.0-34.9, adult: Secondary | ICD-10-CM | POA: Diagnosis not present

## 2019-06-05 DIAGNOSIS — E291 Testicular hypofunction: Secondary | ICD-10-CM | POA: Diagnosis not present

## 2019-06-05 DIAGNOSIS — E782 Mixed hyperlipidemia: Secondary | ICD-10-CM | POA: Diagnosis not present

## 2019-06-05 DIAGNOSIS — E119 Type 2 diabetes mellitus without complications: Secondary | ICD-10-CM | POA: Diagnosis not present

## 2019-06-05 DIAGNOSIS — E1165 Type 2 diabetes mellitus with hyperglycemia: Secondary | ICD-10-CM | POA: Diagnosis not present

## 2019-06-05 DIAGNOSIS — R7301 Impaired fasting glucose: Secondary | ICD-10-CM | POA: Diagnosis not present

## 2019-06-09 DIAGNOSIS — E1165 Type 2 diabetes mellitus with hyperglycemia: Secondary | ICD-10-CM | POA: Diagnosis not present

## 2019-06-09 DIAGNOSIS — I10 Essential (primary) hypertension: Secondary | ICD-10-CM | POA: Diagnosis not present

## 2019-06-09 DIAGNOSIS — D751 Secondary polycythemia: Secondary | ICD-10-CM | POA: Diagnosis not present

## 2019-06-09 DIAGNOSIS — E6609 Other obesity due to excess calories: Secondary | ICD-10-CM | POA: Diagnosis not present

## 2019-06-09 DIAGNOSIS — E782 Mixed hyperlipidemia: Secondary | ICD-10-CM | POA: Diagnosis not present

## 2019-06-14 DIAGNOSIS — L03818 Cellulitis of other sites: Secondary | ICD-10-CM | POA: Diagnosis not present

## 2019-06-14 DIAGNOSIS — W57XXXA Bitten or stung by nonvenomous insect and other nonvenomous arthropods, initial encounter: Secondary | ICD-10-CM | POA: Diagnosis not present

## 2019-06-15 DIAGNOSIS — I10 Essential (primary) hypertension: Secondary | ICD-10-CM | POA: Diagnosis not present

## 2019-06-15 DIAGNOSIS — E782 Mixed hyperlipidemia: Secondary | ICD-10-CM | POA: Diagnosis not present

## 2019-06-15 DIAGNOSIS — E1165 Type 2 diabetes mellitus with hyperglycemia: Secondary | ICD-10-CM | POA: Diagnosis not present

## 2019-07-17 ENCOUNTER — Other Ambulatory Visit: Payer: Self-pay

## 2019-08-14 DIAGNOSIS — E1165 Type 2 diabetes mellitus with hyperglycemia: Secondary | ICD-10-CM | POA: Diagnosis not present

## 2019-08-14 DIAGNOSIS — I10 Essential (primary) hypertension: Secondary | ICD-10-CM | POA: Diagnosis not present

## 2019-08-14 DIAGNOSIS — E782 Mixed hyperlipidemia: Secondary | ICD-10-CM | POA: Diagnosis not present

## 2019-10-03 DIAGNOSIS — E291 Testicular hypofunction: Secondary | ICD-10-CM | POA: Diagnosis not present

## 2019-10-03 DIAGNOSIS — R7301 Impaired fasting glucose: Secondary | ICD-10-CM | POA: Diagnosis not present

## 2019-10-03 DIAGNOSIS — Z125 Encounter for screening for malignant neoplasm of prostate: Secondary | ICD-10-CM | POA: Diagnosis not present

## 2019-10-03 DIAGNOSIS — R350 Frequency of micturition: Secondary | ICD-10-CM | POA: Diagnosis not present

## 2019-10-03 DIAGNOSIS — E782 Mixed hyperlipidemia: Secondary | ICD-10-CM | POA: Diagnosis not present

## 2019-10-03 DIAGNOSIS — E119 Type 2 diabetes mellitus without complications: Secondary | ICD-10-CM | POA: Diagnosis not present

## 2019-10-03 DIAGNOSIS — E1165 Type 2 diabetes mellitus with hyperglycemia: Secondary | ICD-10-CM | POA: Diagnosis not present

## 2019-10-03 DIAGNOSIS — I10 Essential (primary) hypertension: Secondary | ICD-10-CM | POA: Diagnosis not present

## 2019-10-06 DIAGNOSIS — Z23 Encounter for immunization: Secondary | ICD-10-CM | POA: Diagnosis not present

## 2019-10-06 DIAGNOSIS — D751 Secondary polycythemia: Secondary | ICD-10-CM | POA: Diagnosis not present

## 2019-10-06 DIAGNOSIS — E6609 Other obesity due to excess calories: Secondary | ICD-10-CM | POA: Diagnosis not present

## 2019-10-06 DIAGNOSIS — E782 Mixed hyperlipidemia: Secondary | ICD-10-CM | POA: Diagnosis not present

## 2019-10-06 DIAGNOSIS — E1165 Type 2 diabetes mellitus with hyperglycemia: Secondary | ICD-10-CM | POA: Diagnosis not present

## 2019-10-06 DIAGNOSIS — Z0000 Encounter for general adult medical examination without abnormal findings: Secondary | ICD-10-CM | POA: Diagnosis not present

## 2019-10-06 DIAGNOSIS — I10 Essential (primary) hypertension: Secondary | ICD-10-CM | POA: Diagnosis not present

## 2019-10-12 DIAGNOSIS — E1165 Type 2 diabetes mellitus with hyperglycemia: Secondary | ICD-10-CM | POA: Diagnosis not present

## 2019-10-12 DIAGNOSIS — E782 Mixed hyperlipidemia: Secondary | ICD-10-CM | POA: Diagnosis not present

## 2019-10-12 DIAGNOSIS — I10 Essential (primary) hypertension: Secondary | ICD-10-CM | POA: Diagnosis not present

## 2019-10-17 ENCOUNTER — Other Ambulatory Visit: Payer: Self-pay

## 2019-10-17 ENCOUNTER — Other Ambulatory Visit (HOSPITAL_COMMUNITY): Payer: Self-pay | Admitting: Adult Health Nurse Practitioner

## 2019-10-17 ENCOUNTER — Ambulatory Visit (HOSPITAL_COMMUNITY)
Admission: RE | Admit: 2019-10-17 | Discharge: 2019-10-17 | Disposition: A | Discharge status: Home / Self Care | Payer: Medicare Other | Source: Ambulatory Visit | Attending: Adult Health Nurse Practitioner | Admitting: Adult Health Nurse Practitioner

## 2019-10-17 DIAGNOSIS — R1084 Generalized abdominal pain: Secondary | ICD-10-CM

## 2019-10-17 DIAGNOSIS — K5900 Constipation, unspecified: Secondary | ICD-10-CM | POA: Diagnosis not present

## 2019-10-17 DIAGNOSIS — K5909 Other constipation: Secondary | ICD-10-CM

## 2019-10-17 DIAGNOSIS — R109 Unspecified abdominal pain: Secondary | ICD-10-CM | POA: Diagnosis not present

## 2019-12-21 DIAGNOSIS — I10 Essential (primary) hypertension: Secondary | ICD-10-CM | POA: Diagnosis not present

## 2019-12-21 DIAGNOSIS — E7849 Other hyperlipidemia: Secondary | ICD-10-CM | POA: Diagnosis not present

## 2019-12-21 DIAGNOSIS — E1165 Type 2 diabetes mellitus with hyperglycemia: Secondary | ICD-10-CM | POA: Diagnosis not present

## 2020-02-13 DIAGNOSIS — Z23 Encounter for immunization: Secondary | ICD-10-CM | POA: Diagnosis not present

## 2020-03-12 DIAGNOSIS — Z23 Encounter for immunization: Secondary | ICD-10-CM | POA: Diagnosis not present

## 2020-04-05 DIAGNOSIS — J0110 Acute frontal sinusitis, unspecified: Secondary | ICD-10-CM | POA: Diagnosis not present

## 2020-04-05 DIAGNOSIS — E291 Testicular hypofunction: Secondary | ICD-10-CM | POA: Diagnosis not present

## 2020-04-05 DIAGNOSIS — E7849 Other hyperlipidemia: Secondary | ICD-10-CM | POA: Diagnosis not present

## 2020-04-05 DIAGNOSIS — E119 Type 2 diabetes mellitus without complications: Secondary | ICD-10-CM | POA: Diagnosis not present

## 2020-04-05 DIAGNOSIS — E6609 Other obesity due to excess calories: Secondary | ICD-10-CM | POA: Diagnosis not present

## 2020-04-05 DIAGNOSIS — F411 Generalized anxiety disorder: Secondary | ICD-10-CM | POA: Diagnosis not present

## 2020-04-05 DIAGNOSIS — K219 Gastro-esophageal reflux disease without esophagitis: Secondary | ICD-10-CM | POA: Diagnosis not present

## 2020-04-05 DIAGNOSIS — E782 Mixed hyperlipidemia: Secondary | ICD-10-CM | POA: Diagnosis not present

## 2020-04-05 DIAGNOSIS — J069 Acute upper respiratory infection, unspecified: Secondary | ICD-10-CM | POA: Diagnosis not present

## 2020-04-05 DIAGNOSIS — I10 Essential (primary) hypertension: Secondary | ICD-10-CM | POA: Diagnosis not present

## 2020-04-05 DIAGNOSIS — E1165 Type 2 diabetes mellitus with hyperglycemia: Secondary | ICD-10-CM | POA: Diagnosis not present

## 2020-04-05 DIAGNOSIS — D751 Secondary polycythemia: Secondary | ICD-10-CM | POA: Diagnosis not present

## 2020-04-10 DIAGNOSIS — Z6830 Body mass index (BMI) 30.0-30.9, adult: Secondary | ICD-10-CM | POA: Diagnosis not present

## 2020-04-10 DIAGNOSIS — E782 Mixed hyperlipidemia: Secondary | ICD-10-CM | POA: Diagnosis not present

## 2020-04-10 DIAGNOSIS — E1165 Type 2 diabetes mellitus with hyperglycemia: Secondary | ICD-10-CM | POA: Diagnosis not present

## 2020-04-10 DIAGNOSIS — D751 Secondary polycythemia: Secondary | ICD-10-CM | POA: Diagnosis not present

## 2020-04-10 DIAGNOSIS — I10 Essential (primary) hypertension: Secondary | ICD-10-CM | POA: Diagnosis not present

## 2020-04-10 DIAGNOSIS — R944 Abnormal results of kidney function studies: Secondary | ICD-10-CM | POA: Diagnosis not present

## 2020-04-10 DIAGNOSIS — K5900 Constipation, unspecified: Secondary | ICD-10-CM | POA: Diagnosis not present

## 2020-05-01 DIAGNOSIS — E7849 Other hyperlipidemia: Secondary | ICD-10-CM | POA: Diagnosis not present

## 2020-05-01 DIAGNOSIS — E119 Type 2 diabetes mellitus without complications: Secondary | ICD-10-CM | POA: Diagnosis not present

## 2020-05-01 DIAGNOSIS — J069 Acute upper respiratory infection, unspecified: Secondary | ICD-10-CM | POA: Diagnosis not present

## 2020-05-01 DIAGNOSIS — E782 Mixed hyperlipidemia: Secondary | ICD-10-CM | POA: Diagnosis not present

## 2020-05-01 DIAGNOSIS — E1165 Type 2 diabetes mellitus with hyperglycemia: Secondary | ICD-10-CM | POA: Diagnosis not present

## 2020-05-01 DIAGNOSIS — K219 Gastro-esophageal reflux disease without esophagitis: Secondary | ICD-10-CM | POA: Diagnosis not present

## 2020-05-01 DIAGNOSIS — E6609 Other obesity due to excess calories: Secondary | ICD-10-CM | POA: Diagnosis not present

## 2020-05-01 DIAGNOSIS — F411 Generalized anxiety disorder: Secondary | ICD-10-CM | POA: Diagnosis not present

## 2020-05-01 DIAGNOSIS — D751 Secondary polycythemia: Secondary | ICD-10-CM | POA: Diagnosis not present

## 2020-05-01 DIAGNOSIS — E291 Testicular hypofunction: Secondary | ICD-10-CM | POA: Diagnosis not present

## 2020-05-01 DIAGNOSIS — I10 Essential (primary) hypertension: Secondary | ICD-10-CM | POA: Diagnosis not present

## 2020-05-01 DIAGNOSIS — J0110 Acute frontal sinusitis, unspecified: Secondary | ICD-10-CM | POA: Diagnosis not present

## 2020-08-12 DIAGNOSIS — J069 Acute upper respiratory infection, unspecified: Secondary | ICD-10-CM | POA: Diagnosis not present

## 2020-08-12 DIAGNOSIS — E7849 Other hyperlipidemia: Secondary | ICD-10-CM | POA: Diagnosis not present

## 2020-08-12 DIAGNOSIS — E782 Mixed hyperlipidemia: Secondary | ICD-10-CM | POA: Diagnosis not present

## 2020-08-12 DIAGNOSIS — R7301 Impaired fasting glucose: Secondary | ICD-10-CM | POA: Diagnosis not present

## 2020-08-12 DIAGNOSIS — E291 Testicular hypofunction: Secondary | ICD-10-CM | POA: Diagnosis not present

## 2020-08-12 DIAGNOSIS — W57XXXA Bitten or stung by nonvenomous insect and other nonvenomous arthropods, initial encounter: Secondary | ICD-10-CM | POA: Diagnosis not present

## 2020-08-12 DIAGNOSIS — K219 Gastro-esophageal reflux disease without esophagitis: Secondary | ICD-10-CM | POA: Diagnosis not present

## 2020-08-12 DIAGNOSIS — L03818 Cellulitis of other sites: Secondary | ICD-10-CM | POA: Diagnosis not present

## 2020-08-12 DIAGNOSIS — E1165 Type 2 diabetes mellitus with hyperglycemia: Secondary | ICD-10-CM | POA: Diagnosis not present

## 2020-08-12 DIAGNOSIS — I10 Essential (primary) hypertension: Secondary | ICD-10-CM | POA: Diagnosis not present

## 2020-08-12 DIAGNOSIS — E6609 Other obesity due to excess calories: Secondary | ICD-10-CM | POA: Diagnosis not present

## 2020-08-12 DIAGNOSIS — Z6834 Body mass index (BMI) 34.0-34.9, adult: Secondary | ICD-10-CM | POA: Diagnosis not present

## 2020-08-16 DIAGNOSIS — E782 Mixed hyperlipidemia: Secondary | ICD-10-CM | POA: Diagnosis not present

## 2020-08-16 DIAGNOSIS — L989 Disorder of the skin and subcutaneous tissue, unspecified: Secondary | ICD-10-CM | POA: Diagnosis not present

## 2020-08-16 DIAGNOSIS — Z23 Encounter for immunization: Secondary | ICD-10-CM | POA: Diagnosis not present

## 2020-08-16 DIAGNOSIS — I129 Hypertensive chronic kidney disease with stage 1 through stage 4 chronic kidney disease, or unspecified chronic kidney disease: Secondary | ICD-10-CM | POA: Diagnosis not present

## 2020-08-16 DIAGNOSIS — E1165 Type 2 diabetes mellitus with hyperglycemia: Secondary | ICD-10-CM | POA: Diagnosis not present

## 2020-08-16 DIAGNOSIS — Z0001 Encounter for general adult medical examination with abnormal findings: Secondary | ICD-10-CM | POA: Diagnosis not present

## 2020-08-16 DIAGNOSIS — E1122 Type 2 diabetes mellitus with diabetic chronic kidney disease: Secondary | ICD-10-CM | POA: Diagnosis not present

## 2020-08-16 DIAGNOSIS — D751 Secondary polycythemia: Secondary | ICD-10-CM | POA: Diagnosis not present

## 2020-08-16 DIAGNOSIS — E6609 Other obesity due to excess calories: Secondary | ICD-10-CM | POA: Diagnosis not present

## 2020-08-16 DIAGNOSIS — N1832 Chronic kidney disease, stage 3b: Secondary | ICD-10-CM | POA: Diagnosis not present

## 2020-08-21 ENCOUNTER — Other Ambulatory Visit: Payer: Self-pay | Admitting: Gastroenterology

## 2020-08-21 DIAGNOSIS — Z85828 Personal history of other malignant neoplasm of skin: Secondary | ICD-10-CM | POA: Diagnosis not present

## 2020-08-21 DIAGNOSIS — D485 Neoplasm of uncertain behavior of skin: Secondary | ICD-10-CM | POA: Diagnosis not present

## 2020-08-21 DIAGNOSIS — C4441 Basal cell carcinoma of skin of scalp and neck: Secondary | ICD-10-CM | POA: Diagnosis not present

## 2020-09-02 DIAGNOSIS — I129 Hypertensive chronic kidney disease with stage 1 through stage 4 chronic kidney disease, or unspecified chronic kidney disease: Secondary | ICD-10-CM | POA: Diagnosis not present

## 2020-09-02 DIAGNOSIS — C44310 Basal cell carcinoma of skin of unspecified parts of face: Secondary | ICD-10-CM | POA: Diagnosis not present

## 2020-09-02 DIAGNOSIS — M542 Cervicalgia: Secondary | ICD-10-CM | POA: Diagnosis not present

## 2020-09-02 DIAGNOSIS — C4441 Basal cell carcinoma of skin of scalp and neck: Secondary | ICD-10-CM | POA: Diagnosis not present

## 2020-09-09 DIAGNOSIS — I129 Hypertensive chronic kidney disease with stage 1 through stage 4 chronic kidney disease, or unspecified chronic kidney disease: Secondary | ICD-10-CM | POA: Diagnosis not present

## 2020-09-11 DIAGNOSIS — M9901 Segmental and somatic dysfunction of cervical region: Secondary | ICD-10-CM | POA: Diagnosis not present

## 2020-09-11 DIAGNOSIS — M542 Cervicalgia: Secondary | ICD-10-CM | POA: Diagnosis not present

## 2020-09-13 DIAGNOSIS — M9901 Segmental and somatic dysfunction of cervical region: Secondary | ICD-10-CM | POA: Diagnosis not present

## 2020-09-13 DIAGNOSIS — M542 Cervicalgia: Secondary | ICD-10-CM | POA: Diagnosis not present

## 2020-09-16 DIAGNOSIS — M9901 Segmental and somatic dysfunction of cervical region: Secondary | ICD-10-CM | POA: Diagnosis not present

## 2020-09-16 DIAGNOSIS — M542 Cervicalgia: Secondary | ICD-10-CM | POA: Diagnosis not present

## 2020-09-18 DIAGNOSIS — M9901 Segmental and somatic dysfunction of cervical region: Secondary | ICD-10-CM | POA: Diagnosis not present

## 2020-09-18 DIAGNOSIS — M542 Cervicalgia: Secondary | ICD-10-CM | POA: Diagnosis not present

## 2020-09-23 DIAGNOSIS — Z01818 Encounter for other preprocedural examination: Secondary | ICD-10-CM | POA: Diagnosis not present

## 2020-09-24 DIAGNOSIS — C44310 Basal cell carcinoma of skin of unspecified parts of face: Secondary | ICD-10-CM | POA: Insufficient documentation

## 2020-09-24 DIAGNOSIS — C4441 Basal cell carcinoma of skin of scalp and neck: Secondary | ICD-10-CM | POA: Diagnosis not present

## 2020-09-26 DIAGNOSIS — Z481 Encounter for planned postprocedural wound closure: Secondary | ICD-10-CM | POA: Diagnosis not present

## 2020-09-26 DIAGNOSIS — Z483 Aftercare following surgery for neoplasm: Secondary | ICD-10-CM | POA: Diagnosis not present

## 2020-09-26 DIAGNOSIS — C4441 Basal cell carcinoma of skin of scalp and neck: Secondary | ICD-10-CM | POA: Diagnosis not present

## 2020-09-26 DIAGNOSIS — C44319 Basal cell carcinoma of skin of other parts of face: Secondary | ICD-10-CM | POA: Diagnosis not present

## 2020-09-26 DIAGNOSIS — Z85828 Personal history of other malignant neoplasm of skin: Secondary | ICD-10-CM | POA: Diagnosis not present

## 2020-10-03 ENCOUNTER — Ambulatory Visit: Payer: Medicare Other | Admitting: Cardiology

## 2020-10-03 ENCOUNTER — Telehealth: Payer: Self-pay

## 2020-10-03 NOTE — Telephone Encounter (Signed)
NOTES ON FILE FROM  DR Allyn Kenner , SENT REFERRAL TO [facility]

## 2020-10-08 DIAGNOSIS — Z23 Encounter for immunization: Secondary | ICD-10-CM | POA: Diagnosis not present

## 2020-10-25 ENCOUNTER — Encounter: Payer: Self-pay | Admitting: Cardiology

## 2020-10-25 ENCOUNTER — Other Ambulatory Visit: Payer: Self-pay

## 2020-10-25 ENCOUNTER — Ambulatory Visit (INDEPENDENT_AMBULATORY_CARE_PROVIDER_SITE_OTHER): Payer: Medicare Other | Admitting: Cardiology

## 2020-10-25 VITALS — BP 138/84 | HR 88 | Ht 69.0 in | Wt 208.0 lb

## 2020-10-25 DIAGNOSIS — I4819 Other persistent atrial fibrillation: Secondary | ICD-10-CM | POA: Diagnosis not present

## 2020-10-25 DIAGNOSIS — I10 Essential (primary) hypertension: Secondary | ICD-10-CM

## 2020-10-25 DIAGNOSIS — E782 Mixed hyperlipidemia: Secondary | ICD-10-CM

## 2020-10-25 DIAGNOSIS — I4891 Unspecified atrial fibrillation: Secondary | ICD-10-CM

## 2020-10-25 MED ORDER — METOPROLOL SUCCINATE ER 25 MG PO TB24: 12.5000 mg | ORAL_TABLET | Freq: Every day | ORAL | 3 refills | Status: DC

## 2020-10-25 NOTE — Progress Notes (Signed)
Cardiology Office Note  Date: 10/25/2020   ID: DAVIDMICHAEL ZARAZUA, DOB October 28, 1946, MRN [id number]  PCP:  Celene Squibb, MD  Cardiologist:  Rozann Lesches, MD Electrophysiologist:  None   Chief Complaint  Patient presents with  . Atrial Fibrillation    History of Present Illness: Max Davenport is a 74 y.o. male referred for cardiology consultation by Dr. Nevada Crane for the evaluation of atrial fibrillation.  I reviewed the available records.  He is here today with a family member.  My understanding is that approximately 1 month ago he underwent Mohs surgery for treatment of skin cancer on the top of his head.  This was a fairly wide excision, he had skin grafting done subsequently.  He states that he was told that during the time that he underwent his initial operation he was found to be in atrial fibrillation.  I do not have any tracings from this outpatient procedure, nothing from his PCP office.  He states that he is not aware of any sense of palpitations, no prior diagnosis of atrial arrhythmias.  I personally reviewed his ECG today which shows atrial fibrillation at 101 bpm, borderline low voltage, poor R wave progression anteriorly, nonspecific T wave changes.  CHA2DS2-VASc score is 3.  We did discuss stroke prophylaxis with DOAC.  The wound on the top of his head is still healing, it is dressed today.  He continues to follow with his surgeon, plans to be seen for a follow-up visit next week.  I reviewed his medications which are outlined below.  Past Medical History:  Diagnosis Date  . Essential hypertension   . Hyperlipidemia   . Skin cancer, basal cell   . Type 2 diabetes mellitus ([place])     Past Surgical History:  Procedure Laterality Date  . BASAL CELL CARCINOMA EXCISION     Top of head  . INGUINAL HERNIA REPAIR Right   . LUMBAR FUSION  2008  . NASAL RECONSTRUCTION    . WISDOM TOOTH EXTRACTION      Current Outpatient Medications  Medication Sig Dispense Refill  .  atorvastatin (LIPITOR) 20 MG tablet Take 20 mg by mouth daily.    Marland Kitchen ezetimibe (ZETIA) 10 MG tablet Take 10 mg by mouth daily.    Marland Kitchen losartan (COZAAR) 50 MG tablet Take 50 mg by mouth in the morning and at bedtime.    . metFORMIN (GLUCOPHAGE) 500 MG tablet Take 500 mg by mouth daily.    . metoprolol succinate (TOPROL XL) 25 MG 24 hr tablet Take 0.5 tablets (12.5 mg total) by mouth daily. 45 tablet 3   No current facility-administered medications for this visit.   Allergies:  Patient has no known allergies.   Social History: The patient  reports that he has never smoked. His smokeless tobacco use includes chew. He reports that he does not drink alcohol and does not use drugs.   Family History: The patient's family history is not on file. He was adopted.   ROS: No palpitations or syncope.  Physical Exam: VS:  BP 138/84 (BP Location: Right Arm)   Pulse 88   Ht 5\' 9"  (1.753 m)   Wt 208 lb (94.3 kg)   BMI 30.72 kg/m , BMI Body mass index is 30.72 kg/m.  Wt Readings from Last 3 Encounters:  10/25/20 208 lb (94.3 kg)    General: Patient appears comfortable at rest. HEENT: Conjunctiva and lids normal, wearing a mask. Neck: Supple, no elevated JVP or carotid bruits,  no thyromegaly. Lungs: Clear to auscultation, nonlabored breathing at rest. Cardiac: Irregularly irregular, no S3 or significant systolic murmur, no pericardial rub. Abdomen: Soft, nontender, bowel sounds present. Extremities: No pitting edema, distal pulses 2+. Skin: Warm and dry. Musculoskeletal: No kyphosis. Neuropsychiatric: Alert and oriented x3, affect grossly appropriate.  ECG:  No old tracing for review today.  Recent Labwork:  August 2021: Hemoglobin 19.0, platelets 178, BUN 21, creatinine 1.55, potassium 5.0, AST 21, ALT 16, cholesterol 145, triglycerides 154, HDL 47, LDL 73, hemoglobin A1c 5.9%  Other Studies Reviewed Today:  No prior cardiac testing for review today.  Assessment and Plan:  1.   Persistent atrial fibrillation, absolute duration is uncertain, but reportedly documented approximately 1 month ago.  He does not report any major sense of palpitations, heart rate around 100 by ECG today.  CHA2DS2-VASc score is 3.  Plan is to initiate Toprol-XL 12.5 mg daily, obtain echocardiogram to assess cardiac structure and function.  Would hold off initiation of DOAC until his skin graft area heals completely, would not want to precipitate any bleeding or breakdown.  We will bring him back in the next 4 to 6 weeks for reevaluation.  2.  Essential hypertension, systolic is in the 546T today.  He continues on losartan.  3.  Mixed hyperlipidemia, on Lipitor and Zetia.  Most recent LDL 73.  Medication Adjustments/Labs and Tests Ordered: Current medicines are reviewed at length with the patient today.  Concerns regarding medicines are outlined above.   Tests Ordered: Orders Placed This Encounter  Procedures  . EKG 12-Lead  . ECHOCARDIOGRAM COMPLETE    Medication Changes: Meds ordered this encounter  Medications  . metoprolol succinate (TOPROL XL) 25 MG 24 hr tablet    Sig: Take 0.5 tablets (12.5 mg total) by mouth daily.    Dispense:  45 tablet    Refill:  3    Disposition:  Follow up 4 to 6 weeks in the [place] office.  Signed, Satira Sark, MD, [facility] 10/25/2020 2:18 PM    [facility] [address] [place], [place] [address] Phone: [phone]; Fax: [phone]

## 2020-10-25 NOTE — Patient Instructions (Signed)
Medication Instructions:  START Toprol XL 12.5 mg daily   *If you need a refill on your cardiac medications before your next appointment, please call your pharmacy*   Lab Work: None today If you have labs (blood work) drawn today and your tests are completely normal, you will receive your results only by: Marland Kitchen MyChart Message (if you have MyChart) OR . A paper copy in the mail If you have any lab test that is abnormal or we need to change your treatment, we will call you to review the results.   Testing/Procedures: Your physician has requested that you have an echocardiogram. Echocardiography is a painless test that uses sound waves to create images of your heart. It provides your doctor with information about the size and shape of your heart and how well your heart's chambers and valves are working. This procedure takes approximately one hour. There are no restrictions for this procedure.     Follow-Up: At [facility], you and your health needs are our priority.  As part of our continuing mission to provide you with exceptional heart care, we have created designated Provider Care Teams.  These Care Teams include your primary Cardiologist (physician) and Advanced Practice Providers (APPs -  Physician Assistants and Nurse Practitioners) who all work together to provide you with the care you need, when you need it.  We recommend signing up for the patient portal called "MyChart".  Sign up information is provided on this After Visit Summary.  MyChart is used to connect with patients for Virtual Visits (Telemedicine).  Patients are able to view lab/test results, encounter notes, upcoming appointments, etc.  Non-urgent messages can be sent to your provider as well.   To learn more about what you can do with MyChart, go to [email or web address].    Your next appointment:   6 week(s)  The format for your next appointment:   In Person  Provider:   Rozann Lesches, MD, Bernerd Pho,  PA-C or Ermalinda Barrios, PA-C   Other Instructions None    Thank you for choosing [facility] !

## 2020-10-29 ENCOUNTER — Ambulatory Visit (HOSPITAL_COMMUNITY)
Admission: RE | Admit: 2020-10-29 | Discharge: 2020-10-29 | Disposition: A | Discharge status: Home / Self Care | Payer: Medicare Other | Source: Ambulatory Visit | Attending: Cardiology | Admitting: Cardiology

## 2020-10-29 ENCOUNTER — Other Ambulatory Visit: Payer: Self-pay

## 2020-10-29 DIAGNOSIS — I4819 Other persistent atrial fibrillation: Secondary | ICD-10-CM

## 2020-10-29 LAB — ECHOCARDIOGRAM COMPLETE
Area-P 1/2: 3.2 cm2
S' Lateral: 2.1 cm

## 2020-10-29 NOTE — Progress Notes (Signed)
*  PRELIMINARY RESULTS* Echocardiogram 2D Echocardiogram has been performed.  Samuel Germany 10/29/2020, 4:01 PM

## 2020-11-14 ENCOUNTER — Ambulatory Visit: Payer: Medicare Other | Attending: Internal Medicine

## 2020-11-14 DIAGNOSIS — Z23 Encounter for immunization: Secondary | ICD-10-CM

## 2020-11-14 NOTE — Progress Notes (Signed)
   Covid-19 Vaccination Clinic  Name:  SALEM LEMBKE    MRN: [id number] DOB: 01/29/1946  11/14/2020  Mr. Misuraca was observed post Covid-19 immunization for 15 minutes without incident. He was provided with Vaccine Information Sheet and instruction to access the V-Safe system.   Mr. Citro was instructed to call 911 with any severe reactions post vaccine: Marland Kitchen Difficulty breathing  . Swelling of face and throat  . A fast heartbeat  . A bad rash all over body  . Dizziness and weakness   Immunizations Administered    No immunizations on file.

## 2020-12-20 DIAGNOSIS — E7849 Other hyperlipidemia: Secondary | ICD-10-CM | POA: Diagnosis not present

## 2020-12-20 DIAGNOSIS — L03818 Cellulitis of other sites: Secondary | ICD-10-CM | POA: Diagnosis not present

## 2020-12-20 DIAGNOSIS — W57XXXA Bitten or stung by nonvenomous insect and other nonvenomous arthropods, initial encounter: Secondary | ICD-10-CM | POA: Diagnosis not present

## 2020-12-20 DIAGNOSIS — R7301 Impaired fasting glucose: Secondary | ICD-10-CM | POA: Diagnosis not present

## 2020-12-20 DIAGNOSIS — E6609 Other obesity due to excess calories: Secondary | ICD-10-CM | POA: Diagnosis not present

## 2020-12-20 DIAGNOSIS — K219 Gastro-esophageal reflux disease without esophagitis: Secondary | ICD-10-CM | POA: Diagnosis not present

## 2020-12-20 DIAGNOSIS — E291 Testicular hypofunction: Secondary | ICD-10-CM | POA: Diagnosis not present

## 2020-12-20 DIAGNOSIS — I10 Essential (primary) hypertension: Secondary | ICD-10-CM | POA: Diagnosis not present

## 2020-12-20 DIAGNOSIS — E1122 Type 2 diabetes mellitus with diabetic chronic kidney disease: Secondary | ICD-10-CM | POA: Diagnosis not present

## 2020-12-20 DIAGNOSIS — J069 Acute upper respiratory infection, unspecified: Secondary | ICD-10-CM | POA: Diagnosis not present

## 2020-12-20 DIAGNOSIS — Z6834 Body mass index (BMI) 34.0-34.9, adult: Secondary | ICD-10-CM | POA: Diagnosis not present

## 2020-12-20 DIAGNOSIS — E1165 Type 2 diabetes mellitus with hyperglycemia: Secondary | ICD-10-CM | POA: Diagnosis not present

## 2020-12-23 ENCOUNTER — Ambulatory Visit: Payer: Medicare Other | Admitting: Cardiology

## 2020-12-25 ENCOUNTER — Ambulatory Visit (INDEPENDENT_AMBULATORY_CARE_PROVIDER_SITE_OTHER): Payer: Medicare Other | Admitting: Cardiology

## 2020-12-25 ENCOUNTER — Encounter: Payer: Self-pay | Admitting: *Deleted

## 2020-12-25 ENCOUNTER — Encounter: Payer: Self-pay | Admitting: Cardiology

## 2020-12-25 VITALS — BP 112/90 | HR 84 | Ht 69.0 in | Wt 216.0 lb

## 2020-12-25 DIAGNOSIS — I4819 Other persistent atrial fibrillation: Secondary | ICD-10-CM | POA: Diagnosis not present

## 2020-12-25 DIAGNOSIS — I10 Essential (primary) hypertension: Secondary | ICD-10-CM

## 2020-12-25 NOTE — Progress Notes (Signed)
Cardiology Office Note  Date: 12/25/2020   ID: Max, Davenport [birth year], MRN [id number]  PCP:  Max Squibb, MD  Cardiologist:  Max Lesches, MD Electrophysiologist:  None   Chief Complaint  Patient presents with  . Cardiac follow-up    History of Present Illness: Max Davenport is a 75 y.o. male seen in consultation back in November 2021.  He presents for a follow-up visit, still reports no sense of palpitations.  He has healed well after Mohs surgery on the top of his head.  Echocardiogram from November 2021 reported LVEF 65 to 70%, normal RV contraction and estimated PASP, moderate biatrial enlargement, mild aortic regurgitation with mildly sclerotic aortic valve.  He had recent lab work with Dr. Nevada Davenport, we are requesting the results for review.  I talked with him about initiation of DOAC and otherwise continuation of current dose of beta-blocker.  I personally reviewed his ECG today which shows rate controlled atrial fibrillation with low voltage.  Past Medical History:  Diagnosis Date  . Essential hypertension   . Hyperlipidemia   . Skin cancer, basal cell   . Type 2 diabetes mellitus ([place])     Past Surgical History:  Procedure Laterality Date  . BASAL CELL CARCINOMA EXCISION     Top of head  . INGUINAL HERNIA REPAIR Right   . LUMBAR FUSION  2008  . NASAL RECONSTRUCTION    . WISDOM TOOTH EXTRACTION      Current Outpatient Medications  Medication Sig Dispense Refill  . atorvastatin (LIPITOR) 20 MG tablet Take 20 mg by mouth daily.    Marland Kitchen ezetimibe (ZETIA) 10 MG tablet Take 10 mg by mouth daily.    Marland Kitchen losartan (COZAAR) 50 MG tablet Take 50 mg by mouth in the morning and at bedtime.    . metFORMIN (GLUCOPHAGE) 500 MG tablet Take 500 mg by mouth daily.    . metoprolol succinate (TOPROL XL) 25 MG 24 hr tablet Take 0.5 tablets (12.5 mg total) by mouth daily. 45 tablet 3   No current facility-administered medications for this visit.   Allergies:  Patient has no  known allergies.   ROS: No palpitations or dizziness.  Physical Exam: VS:  BP 112/90   Pulse 84   Ht 5\' 9"  (1.753 m)   Wt 216 lb (98 kg)   SpO2 96%   BMI 31.90 kg/m , BMI Body mass index is 31.9 kg/m.  Wt Readings from Last 3 Encounters:  12/25/20 216 lb (98 kg)  10/25/20 208 lb (94.3 kg)    General: Patient appears comfortable at rest. HEENT: Conjunctiva and lids normal, wearing a mask. Neck: Supple, no elevated JVP or carotid bruits, no thyromegaly. Lungs: Clear to auscultation, nonlabored breathing at rest. Cardiac: Irregularly irregular, no S3 or significant systolic murmur, no pericardial rub. Extremities: No pitting edema.  ECG:  An ECG dated 10/25/2020 was personally reviewed today and demonstrated:  Atrial fibrillation with borderline low voltage, poor R wave progression, nonspecific T wave changes.  Recent Labwork:  August 2021: Hemoglobin 19.0, platelets 178, BUN 21, creatinine 1.55, potassium 5.0, AST 21, ALT 16, cholesterol 145, triglycerides 154, HDL 47, LDL 73, hemoglobin A1c 5.9%  Other Studies Reviewed Today:  Echocardiogram 10/29/2020: 1. Left ventricular ejection fraction, by estimation, is 65 to 70%. The  left ventricle has normal function. The left ventricle has no regional  wall motion abnormalities. Left ventricular diastolic parameters are  indeterminate.  2. Right ventricular systolic function is normal. The right  ventricular  size is normal. There is normal pulmonary artery systolic pressure.  3. Left atrial size was moderately dilated.  4. Right atrial size was moderately dilated.  5. The mitral valve is abnormal. Trivial mitral valve regurgitation.  6. The aortic valve is abnormal. Aortic valve regurgitation is mild. Mild  aortic valve sclerosis is present, with no evidence of aortic valve  stenosis.  7. The inferior vena cava is normal in size with greater than 50%  respiratory variability, suggesting right atrial pressure of 3 mmHg.    Assessment and Plan:  1. Persistent atrial fibrillation of uncertain duration, relatively asymptomatic at this point and tolerating low-dose beta-blocker.  CHA2DS2-VASc score is 3.  We will request lab work from Max Davenport office and likely initiate Eliquis for stroke prophylaxis.  2. Essential hypertension, systolic 097 today.  Continue current dose of losartan.  Medication Adjustments/Labs and Tests Ordered: Current medicines are reviewed at length with the patient today.  Concerns regarding medicines are outlined above.   Tests Ordered: Orders Placed This Encounter  Procedures  . EKG 12-Lead    Medication Changes: No orders of the defined types were placed in this encounter.   Disposition:  Follow up 3 months in the [place] office.  Signed, Satira Sark, MD, [facility] 12/25/2020 12:24 PM    [facility] [place], [place], [place] [address] Phone: [phone]; Fax: [phone]

## 2020-12-25 NOTE — Patient Instructions (Addendum)
Medication Instructions:  Your physician recommends that you continue on your current medications as directed. Please refer to the Current Medication list given to you today.   Labwork: None  Testing/Procedures: None  Follow-Up: Your physician recommends that you schedule a follow-up appointment in: 3 months  Any Other Special Instructions Will Be Listed Below (If Applicable).     If you need a refill on your cardiac medications before your next appointment, please call your pharmacy.   

## 2021-01-08 DIAGNOSIS — C44310 Basal cell carcinoma of skin of unspecified parts of face: Secondary | ICD-10-CM | POA: Diagnosis not present

## 2021-01-10 ENCOUNTER — Encounter: Payer: Self-pay | Admitting: Cardiology

## 2021-01-14 ENCOUNTER — Telehealth: Payer: Self-pay | Admitting: *Deleted

## 2021-01-14 DIAGNOSIS — I4819 Other persistent atrial fibrillation: Secondary | ICD-10-CM

## 2021-01-14 DIAGNOSIS — Z79899 Other long term (current) drug therapy: Secondary | ICD-10-CM

## 2021-01-14 MED ORDER — APIXABAN 5 MG PO TABS: 5.0000 mg | ORAL_TABLET | Freq: Two times a day (BID) | ORAL | 6 refills | Status: DC

## 2021-01-14 NOTE — Telephone Encounter (Signed)
Patient informed, agrees and verbalized understanding of plan. Lab orders faxed to [facility] Patient will stop by [place] office to pick up eliquis voucher

## 2021-01-14 NOTE — Telephone Encounter (Signed)
-----   Message from Satira Sark, MD sent at 01/12/2021  6:00 PM EST ----- Results reviewed.  Lab work noted from Dr. Nevada Crane.  As per recent office note, would initiate Eliquis at 5 mg twice daily.  Check follow-up CBC and BMET for next office visit scheduled in 3 months.

## 2021-01-15 ENCOUNTER — Encounter: Payer: Self-pay | Admitting: *Deleted

## 2021-01-15 ENCOUNTER — Telehealth: Payer: Self-pay | Admitting: *Deleted

## 2021-01-15 DIAGNOSIS — I4891 Unspecified atrial fibrillation: Secondary | ICD-10-CM | POA: Insufficient documentation

## 2021-01-15 DIAGNOSIS — I4819 Other persistent atrial fibrillation: Secondary | ICD-10-CM | POA: Insufficient documentation

## 2021-01-15 MED ORDER — RIVAROXABAN 15 MG PO TABS: 15.0000 mg | ORAL_TABLET | Freq: Every day | ORAL | 11 refills | Status: DC

## 2021-01-15 NOTE — Telephone Encounter (Signed)
I have noticed this with a number of insurance providers since the beginning of the year.  We can certainly start with Xarelto instead.  Based on his lab work from Dr. Nevada Crane, creatinine clearance is 57 by standard methods, 48 when adjusted for BMI.  Would suggest starting Xarelto 15 mg daily for now and then plan to repeat lab work as already arranged per prior discussion.

## 2021-01-15 NOTE — Telephone Encounter (Signed)
Received denial of prior auth. Of Eliquis. States that pt must have tried and failed warfarin or Xarelto. Please advise.

## 2021-01-15 NOTE — Telephone Encounter (Signed)
Pt notified of denial and states that he with complete current supply of Eliquis then state he will start Xarelto.

## 2021-03-31 ENCOUNTER — Ambulatory Visit: Payer: Medicare Other | Admitting: Cardiology

## 2021-04-07 DIAGNOSIS — I4819 Other persistent atrial fibrillation: Secondary | ICD-10-CM | POA: Diagnosis not present

## 2021-04-07 DIAGNOSIS — Z79899 Other long term (current) drug therapy: Secondary | ICD-10-CM | POA: Diagnosis not present

## 2021-04-07 LAB — BASIC METABOLIC PANEL
BUN/Creatinine Ratio: 18 (calc) (ref 6–22)
BUN: 27 mg/dL — ABNORMAL HIGH (ref 7–25)
CO2: 28 mmol/L (ref 20–32)
Calcium: 9.5 mg/dL (ref 8.6–10.3)
Chloride: 105 mmol/L (ref 98–110)
Creat: 1.48 mg/dL — ABNORMAL HIGH (ref 0.70–1.18)
Glucose, Bld: 177 mg/dL — ABNORMAL HIGH (ref 65–99)
Potassium: 4.8 mmol/L (ref 3.5–5.3)
Sodium: 140 mmol/L (ref 135–146)

## 2021-04-07 LAB — CBC
HCT: 50.7 % — ABNORMAL HIGH (ref 38.5–50.0)
Hemoglobin: 17.6 g/dL — ABNORMAL HIGH (ref 13.2–17.1)
MCH: 33.8 pg — ABNORMAL HIGH (ref 27.0–33.0)
MCHC: 34.7 g/dL (ref 32.0–36.0)
MCV: 97.5 fL (ref 80.0–100.0)
MPV: 10.2 fL (ref 7.5–12.5)
Platelets: 179 10*3/uL (ref 140–400)
RBC: 5.2 10*6/uL (ref 4.20–5.80)
RDW: 11.8 % (ref 11.0–15.0)
WBC: 6.3 10*3/uL (ref 3.8–10.8)

## 2021-04-08 ENCOUNTER — Telehealth: Payer: Self-pay | Admitting: *Deleted

## 2021-04-08 NOTE — Telephone Encounter (Signed)
-----   Message from Satira Sark, MD sent at 04/07/2021  3:50 PM EDT ----- Results reviewed.  Creatinine 1.48 and potassium normal, relatively stable findings.  Continue with current medications and follow-up plan.

## 2021-04-08 NOTE — Telephone Encounter (Signed)
-----   Message from Satira Sark, MD sent at 04/07/2021  3:17 PM EDT ----- Results reviewed.  Hemoglobin 17.6, normal platelet count.  No change in current regimen.

## 2021-04-08 NOTE — Telephone Encounter (Signed)
Patient informed. 

## 2021-04-10 NOTE — Progress Notes (Signed)
Cardiology Office Note  Date: 04/11/2021   ID: Sloan, Takagi [birth year], MRN [id number]  PCP:  Celene Squibb, MD  Cardiologist:  Rozann Lesches, MD Electrophysiologist:  None   Chief Complaint  Patient presents with  . Cardiac follow-up    History of Present Illness: Max Davenport is a 75 y.o. male last seen in January.  He is here for a routine visit.  Doing well with atrial fibrillation, no symptoms to include palpitations or chest discomfort.  He tries to stay active with exercise on a regular basis.  I reviewed his recent lab work as noted below.  He does not describe any spontaneous bleeding problems.  Blood pressure elevated today, he does have an automatic cuff and has been following it at home.  He reports compliance with his medications noted below.  Past Medical History:  Diagnosis Date  . Essential hypertension   . Hyperlipidemia   . Skin cancer, basal cell   . Type 2 diabetes mellitus ([place])     Past Surgical History:  Procedure Laterality Date  . BASAL CELL CARCINOMA EXCISION     Top of head  . INGUINAL HERNIA REPAIR Right   . LUMBAR FUSION  2008  . NASAL RECONSTRUCTION    . WISDOM TOOTH EXTRACTION      Current Outpatient Medications  Medication Sig Dispense Refill  . atorvastatin (LIPITOR) 20 MG tablet Take 20 mg by mouth daily.    Marland Kitchen ezetimibe (ZETIA) 10 MG tablet Take 10 mg by mouth daily.    Marland Kitchen losartan (COZAAR) 100 MG tablet Take 100 mg by mouth daily.    . metFORMIN (GLUCOPHAGE) 500 MG tablet Take 500 mg by mouth daily.    . metoprolol succinate (TOPROL XL) 25 MG 24 hr tablet Take 0.5 tablets (12.5 mg total) by mouth daily. 45 tablet 3  . Rivaroxaban (XARELTO) 15 MG TABS tablet Take 1 tablet (15 mg total) by mouth daily with supper. 30 tablet 11   No current facility-administered medications for this visit.   Allergies:  Patient has no known allergies.   ROS: No orthopnea or PND.  Physical Exam: VS:  BP (!) 146/82   Pulse 92   Ht 5'  9.5" (1.765 m)   Wt 225 lb (102.1 kg)   SpO2 98%   BMI 32.75 kg/m , BMI Body mass index is 32.75 kg/m.  Wt Readings from Last 3 Encounters:  04/11/21 225 lb (102.1 kg)  12/25/20 216 lb (98 kg)  10/25/20 208 lb (94.3 kg)    General: Patient appears comfortable at rest. HEENT: Conjunctiva and lids normal, wearing a mask. Neck: Supple, no elevated JVP or carotid bruits, no thyromegaly. Lungs: Clear to auscultation, nonlabored breathing at rest. Cardiac: Irregularly irregular, no S3 or significant systolic murmur, no pericardial rub. Extremities: No pitting edema.  ECG:  An ECG dated 12/25/2020 was personally reviewed today and demonstrated:  Rate controlled atrial fibrillation with low voltage.  Recent Labwork: 04/07/2021: BUN 27; Creat 1.48; Hemoglobin 17.6; Platelets 179; Potassium 4.8; Sodium 140   Other Studies Reviewed Today:  Echocardiogram 10/29/2020: 1. Left ventricular ejection fraction, by estimation, is 65 to 70%. The  left ventricle has normal function. The left ventricle has no regional  wall motion abnormalities. Left ventricular diastolic parameters are  indeterminate.  2. Right ventricular systolic function is normal. The right ventricular  size is normal. There is normal pulmonary artery systolic pressure.  3. Left atrial size was moderately dilated.  4. Right  atrial size was moderately dilated.  5. The mitral valve is abnormal. Trivial mitral valve regurgitation.  6. The aortic valve is abnormal. Aortic valve regurgitation is mild. Mild  aortic valve sclerosis is present, with no evidence of aortic valve  stenosis.  7. The inferior vena cava is normal in size with greater than 50%  respiratory variability, suggesting right atrial pressure of 3 mmHg.   Assessment and Plan:  1.  Permanent atrial fibrillation.  CHA2DS2-VASc score is 3.  Heart rate well controlled on current dose of Toprol-XL.  He is on renally adjusted dose Xarelto for stroke prophylaxis.   Recent lab work reviewed.  2.  Essential hypertension, blood pressure up today.  He reports compliance with his medications, also including Cozaar.  Continue to track with automatic cuff at home.  Medication Adjustments/Labs and Tests Ordered: Current medicines are reviewed at length with the patient today.  Concerns regarding medicines are outlined above.   Tests Ordered: No orders of the defined types were placed in this encounter.   Medication Changes: No orders of the defined types were placed in this encounter.   Disposition:  Follow up 6 months.  Signed, Satira Sark, MD, [facility] 04/11/2021 2:42 PM    [facility] [address] [place], [place] [address] Phone: [phone]; Fax: [phone]

## 2021-04-11 ENCOUNTER — Other Ambulatory Visit: Payer: Self-pay

## 2021-04-11 ENCOUNTER — Encounter: Payer: Self-pay | Admitting: Cardiology

## 2021-04-11 ENCOUNTER — Ambulatory Visit (INDEPENDENT_AMBULATORY_CARE_PROVIDER_SITE_OTHER): Payer: Medicare Other | Admitting: Cardiology

## 2021-04-11 VITALS — BP 146/82 | HR 92 | Ht 69.5 in | Wt 225.0 lb

## 2021-04-11 DIAGNOSIS — I4819 Other persistent atrial fibrillation: Secondary | ICD-10-CM

## 2021-04-11 DIAGNOSIS — I10 Essential (primary) hypertension: Secondary | ICD-10-CM | POA: Diagnosis not present

## 2021-04-11 NOTE — Patient Instructions (Signed)

## 2021-04-28 DIAGNOSIS — C44310 Basal cell carcinoma of skin of unspecified parts of face: Secondary | ICD-10-CM | POA: Diagnosis not present

## 2021-05-01 ENCOUNTER — Other Ambulatory Visit: Payer: Self-pay

## 2021-05-01 ENCOUNTER — Ambulatory Visit
Admission: EM | Admit: 2021-05-01 | Discharge: 2021-05-01 | Disposition: A | Discharge status: Home / Self Care | Payer: Medicare Other | Attending: Emergency Medicine | Admitting: Emergency Medicine

## 2021-05-01 ENCOUNTER — Encounter: Payer: Self-pay | Admitting: Emergency Medicine

## 2021-05-01 DIAGNOSIS — S76311A Strain of muscle, fascia and tendon of the posterior muscle group at thigh level, right thigh, initial encounter: Secondary | ICD-10-CM | POA: Diagnosis not present

## 2021-05-01 HISTORY — DX: Unspecified atrial fibrillation: I48.91

## 2021-05-01 MED ORDER — PREDNISONE 20 MG PO TABS: 20.0000 mg | ORAL_TABLET | Freq: Two times a day (BID) | ORAL | 0 refills | Status: AC

## 2021-05-01 MED ORDER — TIZANIDINE HCL 4 MG PO TABS: 4.0000 mg | ORAL_TABLET | Freq: Four times a day (QID) | ORAL | 0 refills | Status: DC | PRN

## 2021-05-01 NOTE — Discharge Instructions (Signed)
Continue conservative management of rest, ice, and elevation Use crutches as needed for comfort Prednisone prescribed.   Take zanaflex at nighttime for symptomatic relief. Avoid driving or operating heavy machinery while using medication. Follow up with orthopedist Return or go to the ER if you have any new or worsening symptoms (fever, chills, chest pain, redness, swelling, bruising, deformity, etc...)

## 2021-05-01 NOTE — ED Triage Notes (Signed)
Fell around 2pm today in shower.  Pain in right upper left.  Feels tight .

## 2021-05-01 NOTE — ED Provider Notes (Signed)
[place]   [id number] 05/01/21 Arrival Time: 6063  CC:RT hamstring PAIN  SUBJECTIVE: History from: patient and family. Max Davenport is a 75 y.o. male complains of RT hamstring pain and swelling that occurred today.  Fall in shower Localizes the pain to the RT hamstring.  Describes the pain as constant and tight in character.  Denies alleviating factors.  Symptoms are made worse with walking.  Denies similar symptoms in the past.  Denies fever, chills, erythema, ecchymosis, weakness, numbness and tingling.  ROS: As per HPI.  All other pertinent ROS negative.     Past Medical History:  Diagnosis Date  . Atrial fibrillation ([place])   . Essential hypertension   . Hyperlipidemia   . Skin cancer, basal cell   . Type 2 diabetes mellitus ([place])    Past Surgical History:  Procedure Laterality Date  . BASAL CELL CARCINOMA EXCISION     Top of head  . INGUINAL HERNIA REPAIR Right   . LUMBAR FUSION  2008  . NASAL RECONSTRUCTION    . WISDOM TOOTH EXTRACTION     No Known Allergies No current facility-administered medications on file prior to encounter.   Current Outpatient Medications on File Prior to Encounter  Medication Sig Dispense Refill  . atorvastatin (LIPITOR) 20 MG tablet Take 20 mg by mouth daily.    Marland Kitchen losartan (COZAAR) 100 MG tablet Take 100 mg by mouth daily.    . metFORMIN (GLUCOPHAGE) 500 MG tablet Take 500 mg by mouth daily.    . metoprolol succinate (TOPROL XL) 25 MG 24 hr tablet Take 0.5 tablets (12.5 mg total) by mouth daily. 45 tablet 3  . Rivaroxaban (XARELTO) 15 MG TABS tablet Take 1 tablet (15 mg total) by mouth daily with supper. 30 tablet 11  . [DISCONTINUED] ezetimibe (ZETIA) 10 MG tablet Take 10 mg by mouth daily.     Social History   Socioeconomic History  . Marital status: Single    Spouse name: Not on file  . Number of children: Not on file  . Years of education: Not on file  . Highest education level: Not on file  Occupational History  .  Not on file  Tobacco Use  . Smoking status: Never Smoker  . Smokeless tobacco: Never Used  Vaping Use  . Vaping Use: Never used  Substance and Sexual Activity  . Alcohol use: Never  . Drug use: Never  . Sexual activity: Not on file  Other Topics Concern  . Not on file  Social History Narrative  . Not on file   Social Determinants of Health   Financial Resource Strain: Not on file  Food Insecurity: Not on file  Transportation Needs: Not on file  Physical Activity: Not on file  Stress: Not on file  Social Connections: Not on file  Intimate Partner Violence: Not on file   Family History  Adopted: Yes    OBJECTIVE:  Vitals:   05/01/21 1740  BP: (!) 150/107  Pulse: 84  Resp: 18  Temp: 98.5 F (36.9 C)  TempSrc: Oral  SpO2: 96%    General appearance: ALERT; in no acute distress.  Head: NCAT Lungs: Normal respiratory effort CV: posterior tibialis pulse 2+ Musculoskeletal: RT leg Inspection: Swelling to RT posterior thigh, no redness, bruising, bleeding Palpation: diffusely TTP over RT posterior thigh/ hamstring ROM: FROM active and passive about the knee Strength: 5/5 knee adduction, 5/5 knee flexion, 5/5 knee extension Skin: warm and dry Neurologic: Ambulates with minimal difficulty Psychological:  alert and cooperative; normal mood and affect    ASSESSMENT & PLAN:  1. Hamstring strain, right, initial encounter     Meds ordered this encounter  Medications  . predniSONE (DELTASONE) 20 MG tablet    Sig: Take 1 tablet (20 mg total) by mouth 2 (two) times daily with a meal for 5 days.    Dispense:  10 tablet    Refill:  0    Order Specific Question:   Supervising Provider    Answer:   Raylene Everts [[id number]]  . tiZANidine (ZANAFLEX) 4 MG tablet    Sig: Take 1 tablet (4 mg total) by mouth every 6 (six) hours as needed for muscle spasms.    Dispense:  30 tablet    Refill:  0    Order Specific Question:   Supervising Provider    Answer:   Raylene Everts [[id number]]   Continue conservative management of rest, ice, and elevation Use crutches as needed for comfort Prednisone prescribed.   Take zanaflex at nighttime for symptomatic relief. Avoid driving or operating heavy machinery while using medication. Follow up with orthopedist Return or go to the ER if you have any new or worsening symptoms (fever, chills, chest pain, redness, swelling, bruising, deformity, etc...)    Reviewed expectations re: course of current medical issues. Questions answered. Outlined signs and symptoms indicating need for more acute intervention. Patient verbalized understanding. After Visit Summary given.    Lestine Box, PA-C 05/01/21 1911

## 2021-05-02 ENCOUNTER — Other Ambulatory Visit: Payer: Self-pay

## 2021-05-02 ENCOUNTER — Ambulatory Visit (INDEPENDENT_AMBULATORY_CARE_PROVIDER_SITE_OTHER): Payer: Medicare Other

## 2021-05-02 ENCOUNTER — Ambulatory Visit (INDEPENDENT_AMBULATORY_CARE_PROVIDER_SITE_OTHER): Payer: Medicare Other | Admitting: Orthopedic Surgery

## 2021-05-02 ENCOUNTER — Telehealth: Payer: Self-pay | Admitting: Cardiology

## 2021-05-02 DIAGNOSIS — M79604 Pain in right leg: Secondary | ICD-10-CM | POA: Diagnosis not present

## 2021-05-02 DIAGNOSIS — M79601 Pain in right arm: Secondary | ICD-10-CM

## 2021-05-02 NOTE — Telephone Encounter (Signed)
Fwd to provider

## 2021-05-02 NOTE — Telephone Encounter (Signed)
Patient fell out of the bathtub 05-01-2021. Seen by his orthopedic doctor today.Marland Kitchen He is wanting patient to stop of Xarelto for 3 days.  [phone]

## 2021-05-03 ENCOUNTER — Encounter: Payer: Self-pay | Admitting: Orthopedic Surgery

## 2021-05-03 NOTE — Progress Notes (Signed)
Office Visit Note   Patient: Max Davenport           Date of Birth: 09-24-1946           MRN: [id number] Visit Date: 05/02/2021 Requested by: Celene Squibb, MD [place],  [place] [address] PCP: Celene Squibb, MD  Subjective: Chief Complaint  Patient presents with  . Right Leg - Pain    HPI: Max Davenport is a 75 year old patient with right leg injury right forearm pain.  Fell in the shower 05/01/2021.  He has recently been taking Xarelto for A. fib.  Reports right leg pain as well as some dorsal and ulnar-sided wrist pain when he pushes up out of a chair.  Also radiates into the volar forearm.  He has been taking Tylenol for symptoms.  Denies any numbness and tingling in the right forearm or right leg.              ROS: All systems reviewed are negative as they relate to the chief complaint within the history of present illness.  Patient denies  fevers or chills.   Assessment & Plan: Visit Diagnoses:  1. Right arm pain   2. Pain in right leg     Plan: Impression is right leg hamstring injury at the muscle tendon junction distally.  There is some swelling in the leg but no compartment syndrome.  In my experience hematoma at this time is not really removable in terms of aspirating.  Ace wrap applied continue with minimal weightbearing over the weekend and consider discontinuing blood thinner if possible after consultation with cardiologist.  Faythe Ghee for range of motion of the right leg but would not favor any type of significant exertion over the weekend.  The right forearm does not have any fractures in the elbow or distal radius.  Likely the focal swelling present on the distal volar forearm region is from partial muscle tear.  All the flexor and extensor tendons to the hand are functional and intact.  The something that we can watch.  I think would be good to follow-up with him in 2 weeks.  His wife is a Marine scientist and we discussed findings of compartment syndrome to watch out for.  Also  having Follow-Up Instructions: Return in about 2 weeks (around 05/16/2021).   Orders:  Orders Placed This Encounter  Procedures  . XR Forearm Right  . XR Pelvis 1-2 Views   No orders of the defined types were placed in this encounter.     Procedures: No procedures performed   Clinical Data: No additional findings.  Objective: Vital Signs: There were no vitals taken for this visit.  Physical Exam:  Constitutional: Patient appears well-developed HEENT:  Head: Normocephalic Eyes:EOM are normal Neck: Normal range of motion Cardiovascular: Normal rate Pulmonary/chest: Effort normal Neurologic: Patient is alert Skin: Skin is warm Psychiatric: Patient has normal mood and affect    Ortho Exam: Ortho exam demonstrates full active and passive range of motion of the right knee.  There are some bruising ecchymosis in the posterior right distal hamstring region.  Swelling is present but compartments are soft.  No paresthesias in the sciatic nerve distribution in the right leg distally.  Pedal pulses palpable.  Thigh girth is about 1 cm more on the right compared to the left.  Right forearm is examined.  All flexor and extensor tendons are functional in the right hand.  Wrist range of motion nontender.  Small focal area of  swelling is present about 3 cm proximal to the distal volar wrist flexion crease.  Radial pulses intact.  Compartments soft in the arm.  Elbow range of motion is good without restriction with pronation supination flexion or extension.  Specialty Comments:  No specialty comments available.  Imaging: No results found.   PMFS History: Patient Active Problem List   Diagnosis Date Noted  . Persistent atrial fibrillation ([place]) 01/15/2021   Past Medical History:  Diagnosis Date  . Atrial fibrillation ([place])   . Essential hypertension   . Hyperlipidemia   . Skin cancer, basal cell   . Type 2 diabetes mellitus ([place])     Family History  Adopted: Yes    Past  Surgical History:  Procedure Laterality Date  . BASAL CELL CARCINOMA EXCISION     Top of head  . INGUINAL HERNIA REPAIR Right   . LUMBAR FUSION  2008  . NASAL RECONSTRUCTION    . WISDOM TOOTH EXTRACTION     Social History   Occupational History  . Not on file  Tobacco Use  . Smoking status: Never Smoker  . Smokeless tobacco: Never Used  Vaping Use  . Vaping Use: Never used  Substance and Sexual Activity  . Alcohol use: Never  . Drug use: Never  . Sexual activity: Not on file

## 2021-05-05 NOTE — Telephone Encounter (Signed)
Yes, all things considered it is reasonable for him to hold Xarelto as requested given recent fall and recommendation by his orthopedic provider.

## 2021-05-06 NOTE — Telephone Encounter (Signed)
Pt and sister notified the okay to hold Xarelto for 3 days per request from orthopedic provider, approved by Dr. Domenic Polite.

## 2021-05-19 ENCOUNTER — Ambulatory Visit (INDEPENDENT_AMBULATORY_CARE_PROVIDER_SITE_OTHER): Payer: Medicare Other | Admitting: Orthopedic Surgery

## 2021-05-19 DIAGNOSIS — M79604 Pain in right leg: Secondary | ICD-10-CM

## 2021-05-19 DIAGNOSIS — M79601 Pain in right arm: Secondary | ICD-10-CM | POA: Diagnosis not present

## 2021-05-23 ENCOUNTER — Encounter: Payer: Self-pay | Admitting: Orthopedic Surgery

## 2021-05-23 NOTE — Progress Notes (Signed)
Office Visit Note   Patient: Max Davenport           Date of Birth: 01-11-1946           MRN: [id number] Visit Date: 05/19/2021 Requested by: Celene Squibb, MD [place],  [place] [id number] PCP: Celene Squibb, MD  Subjective: Chief Complaint  Patient presents with   Right Leg - Follow-up    HPI: Max Davenport is a 75 year old patient who fell in the shower 05/01/2021.  Had hamstring muscle tendon junction injury at that time with swelling due to anticoagulants.  He is walking better.  Still has a lot of bruising.  Overall improved.              ROS: All systems reviewed are negative as they relate to the chief complaint within the history of present illness.  Patient denies  fevers or chills.   Assessment & Plan: Visit Diagnoses:  1. Pain in right leg   2. Right arm pain     Plan: Impression is right leg injury with hamstring muscle tendon injury.  Plan is observation.  Continue with stretching and ambulation.  Compression as needed.  Follow-up as needed  Follow-Up Instructions: Return if symptoms worsen or fail to improve.   Orders:  No orders of the defined types were placed in this encounter.  No orders of the defined types were placed in this encounter.     Procedures: No procedures performed   Clinical Data: No additional findings.  Objective: Vital Signs: There were no vitals taken for this visit.  Physical Exam:   Constitutional: Patient appears well-developed HEENT:  Head: Normocephalic Eyes:EOM are normal Neck: Normal range of motion Cardiovascular: Normal rate Pulmonary/chest: Effort normal Neurologic: Patient is alert Skin: Skin is warm Psychiatric: Patient has normal mood and affect   Ortho Exam: Ortho exam demonstrates full active and passive range of motion of the knee and ankle.  Has about 1 cm more swelling on the right than the left but compartments are soft.  Motor or sensory function to the right foot intact.  Pedal pulses  intact.  Has pretty good range of motion flexion and extension at the knee which is symmetric with the left-hand side.  No groin pain with internal ex rotation of the leg.  The bruising area posteriorly is present but the bruising is maturing in terms of color.  No focal fluid collection drainable is present.  Specialty Comments:  No specialty comments available.  Imaging: No results found.   PMFS History: Patient Active Problem List   Diagnosis Date Noted   Persistent atrial fibrillation ([place]) 01/15/2021   Past Medical History:  Diagnosis Date   Atrial fibrillation ([place])    Essential hypertension    Hyperlipidemia    Skin cancer, basal cell    Type 2 diabetes mellitus ([place])     Family History  Adopted: Yes    Past Surgical History:  Procedure Laterality Date   BASAL CELL CARCINOMA EXCISION     Top of head   INGUINAL HERNIA REPAIR Right    LUMBAR FUSION  2008   NASAL RECONSTRUCTION     WISDOM TOOTH EXTRACTION     Social History   Occupational History   Not on file  Tobacco Use   Smoking status: Never   Smokeless tobacco: Never  Vaping Use   Vaping Use: Never used  Substance and Sexual Activity   Alcohol use: Never   Drug use: Never  Sexual activity: Not on file

## 2021-10-28 DIAGNOSIS — Z23 Encounter for immunization: Secondary | ICD-10-CM | POA: Diagnosis not present

## 2021-10-29 DIAGNOSIS — E782 Mixed hyperlipidemia: Secondary | ICD-10-CM | POA: Diagnosis present

## 2021-10-29 DIAGNOSIS — I10 Essential (primary) hypertension: Secondary | ICD-10-CM | POA: Diagnosis present

## 2021-10-30 DIAGNOSIS — I10 Essential (primary) hypertension: Secondary | ICD-10-CM | POA: Diagnosis not present

## 2021-11-04 DIAGNOSIS — I129 Hypertensive chronic kidney disease with stage 1 through stage 4 chronic kidney disease, or unspecified chronic kidney disease: Secondary | ICD-10-CM | POA: Insufficient documentation

## 2021-11-04 DIAGNOSIS — E119 Type 2 diabetes mellitus without complications: Secondary | ICD-10-CM | POA: Insufficient documentation

## 2021-11-04 DIAGNOSIS — E669 Obesity, unspecified: Secondary | ICD-10-CM | POA: Insufficient documentation

## 2021-11-04 DIAGNOSIS — L989 Disorder of the skin and subcutaneous tissue, unspecified: Secondary | ICD-10-CM | POA: Insufficient documentation

## 2021-11-04 DIAGNOSIS — E1122 Type 2 diabetes mellitus with diabetic chronic kidney disease: Secondary | ICD-10-CM | POA: Insufficient documentation

## 2021-11-04 DIAGNOSIS — E1169 Type 2 diabetes mellitus with other specified complication: Secondary | ICD-10-CM | POA: Insufficient documentation

## 2021-11-04 DIAGNOSIS — E118 Type 2 diabetes mellitus with unspecified complications: Secondary | ICD-10-CM | POA: Insufficient documentation

## 2021-11-04 DIAGNOSIS — D751 Secondary polycythemia: Secondary | ICD-10-CM | POA: Insufficient documentation

## 2021-11-04 DIAGNOSIS — R809 Proteinuria, unspecified: Secondary | ICD-10-CM | POA: Insufficient documentation

## 2021-11-05 DIAGNOSIS — I129 Hypertensive chronic kidney disease with stage 1 through stage 4 chronic kidney disease, or unspecified chronic kidney disease: Secondary | ICD-10-CM | POA: Diagnosis not present

## 2021-11-05 DIAGNOSIS — L989 Disorder of the skin and subcutaneous tissue, unspecified: Secondary | ICD-10-CM | POA: Diagnosis not present

## 2021-11-05 DIAGNOSIS — E782 Mixed hyperlipidemia: Secondary | ICD-10-CM | POA: Diagnosis not present

## 2021-11-05 DIAGNOSIS — E756 Lipid storage disorder, unspecified: Secondary | ICD-10-CM | POA: Insufficient documentation

## 2021-11-05 DIAGNOSIS — R809 Proteinuria, unspecified: Secondary | ICD-10-CM | POA: Diagnosis not present

## 2021-11-05 DIAGNOSIS — E6609 Other obesity due to excess calories: Secondary | ICD-10-CM | POA: Diagnosis not present

## 2021-11-05 DIAGNOSIS — Z6830 Body mass index (BMI) 30.0-30.9, adult: Secondary | ICD-10-CM | POA: Diagnosis not present

## 2021-11-05 DIAGNOSIS — I4819 Other persistent atrial fibrillation: Secondary | ICD-10-CM | POA: Diagnosis not present

## 2021-11-05 DIAGNOSIS — E1122 Type 2 diabetes mellitus with diabetic chronic kidney disease: Secondary | ICD-10-CM | POA: Diagnosis not present

## 2021-11-05 DIAGNOSIS — D751 Secondary polycythemia: Secondary | ICD-10-CM | POA: Diagnosis not present

## 2021-11-05 DIAGNOSIS — N1832 Chronic kidney disease, stage 3b: Secondary | ICD-10-CM | POA: Diagnosis not present

## 2021-12-24 ENCOUNTER — Other Ambulatory Visit: Payer: Self-pay | Admitting: Cardiology

## 2022-01-13 DIAGNOSIS — R42 Dizziness and giddiness: Secondary | ICD-10-CM | POA: Diagnosis not present

## 2022-01-13 DIAGNOSIS — R5383 Other fatigue: Secondary | ICD-10-CM | POA: Diagnosis not present

## 2022-01-27 ENCOUNTER — Other Ambulatory Visit: Payer: Self-pay | Admitting: Cardiology

## 2022-01-27 DIAGNOSIS — Z79899 Other long term (current) drug therapy: Secondary | ICD-10-CM

## 2022-01-27 NOTE — Telephone Encounter (Signed)
Prescription refill request for Xarelto received.  Indication: Atrial fib Last office visit: 04/11/21  Myles Gip MD Weight: 102.1kg Age: 76 Scr: 1.48 on 04/07/21 CrCl: 62.28  Based on above findings pt would qualify for increasing Xarelto from 15mg  daily to 20mg  daily but patient is past due for lab work and appt with Dr Domenic Polite.  Will continue current dose of Xarelto and schedule appt for CBC/BMP and MD appt and re-evaluate dose adjustment at that time.  Refill approved x 1

## 2022-01-29 ENCOUNTER — Other Ambulatory Visit (HOSPITAL_COMMUNITY)
Admission: RE | Admit: 2022-01-29 | Discharge: 2022-01-29 | Disposition: A | Discharge status: Home / Self Care | Payer: Medicare Other | Source: Ambulatory Visit | Attending: Cardiology | Admitting: Cardiology

## 2022-01-29 DIAGNOSIS — Z79899 Other long term (current) drug therapy: Secondary | ICD-10-CM | POA: Diagnosis not present

## 2022-01-29 LAB — CBC
HCT: 50.3 % (ref 39.0–52.0)
Hemoglobin: 17.6 g/dL — ABNORMAL HIGH (ref 13.0–17.0)
MCH: 33.9 pg (ref 26.0–34.0)
MCHC: 35 g/dL (ref 30.0–36.0)
MCV: 96.9 fL (ref 80.0–100.0)
Platelets: 185 10*3/uL (ref 150–400)
RBC: 5.19 MIL/uL (ref 4.22–5.81)
RDW: 11.9 % (ref 11.5–15.5)
WBC: 5.9 10*3/uL (ref 4.0–10.5)
nRBC: 0 % (ref 0.0–0.2)

## 2022-01-29 LAB — BASIC METABOLIC PANEL
Anion gap: 12 (ref 5–15)
BUN: 24 mg/dL — ABNORMAL HIGH (ref 8–23)
CO2: 23 mmol/L (ref 22–32)
Calcium: 9.1 mg/dL (ref 8.9–10.3)
Chloride: 103 mmol/L (ref 98–111)
Creatinine, Ser: 1.21 mg/dL (ref 0.61–1.24)
GFR, Estimated: >60 mL/min (ref >60)
Glucose, Bld: 155 mg/dL — ABNORMAL HIGH (ref 70–99)
Potassium: 4.5 mmol/L (ref 3.5–5.1)
Sodium: 138 mmol/L (ref 135–145)

## 2022-01-30 ENCOUNTER — Ambulatory Visit (INDEPENDENT_AMBULATORY_CARE_PROVIDER_SITE_OTHER): Payer: Medicare Other | Admitting: Student

## 2022-01-30 ENCOUNTER — Encounter: Payer: Self-pay | Admitting: Student

## 2022-01-30 ENCOUNTER — Other Ambulatory Visit: Payer: Self-pay

## 2022-01-30 VITALS — BP 178/110 | HR 77 | Ht 69.0 in | Wt 220.4 lb

## 2022-01-30 DIAGNOSIS — I4819 Other persistent atrial fibrillation: Secondary | ICD-10-CM

## 2022-01-30 DIAGNOSIS — I10 Essential (primary) hypertension: Secondary | ICD-10-CM | POA: Diagnosis not present

## 2022-01-30 DIAGNOSIS — E782 Mixed hyperlipidemia: Secondary | ICD-10-CM | POA: Diagnosis not present

## 2022-01-30 DIAGNOSIS — Z7901 Long term (current) use of anticoagulants: Secondary | ICD-10-CM

## 2022-01-30 MED ORDER — EZETIMIBE 10 MG PO TABS: 10.0000 mg | ORAL_TABLET | Freq: Every day | ORAL | 3 refills | Status: DC

## 2022-01-30 MED ORDER — RIVAROXABAN 20 MG PO TABS: 20.0000 mg | ORAL_TABLET | Freq: Every day | ORAL | 11 refills | Status: DC

## 2022-01-30 NOTE — Patient Instructions (Signed)
Medication Instructions:   Restart Zetia 10mg  daily.   Xarelto increased to 20mg  daily.   *If you need a refill on your cardiac medications before your next appointment, please call your pharmacy*    Follow-Up: At [facility], you and your health needs are our priority.  As part of our continuing mission to provide you with exceptional heart care, we have created designated Provider Care Teams.  These Care Teams include your primary Cardiologist (physician) and Advanced Practice Providers (APPs -  Physician Assistants and Nurse Practitioners) who all work together to provide you with the care you need, when you need it.  We recommend signing up for the patient portal called "MyChart".  Sign up information is provided on this After Visit Summary.  MyChart is used to connect with patients for Virtual Visits (Telemedicine).  Patients are able to view lab/test results, encounter notes, upcoming appointments, etc.  Non-urgent messages can be sent to your provider as well.   To learn more about what you can do with MyChart, go to [email or web address].    Your next appointment:   6 month(s)  The format for your next appointment:   In Person  Provider:   You may see Rozann Lesches, MD or one of the following Advanced Practice Providers on your designated Care Team:   Bernerd Pho, PA-C  Ermalinda Barrios, PA-C     Other Instructions  Check Blood Pressure at home. Report back with readings in 2-3 weeks. If unable to check at home, please let us know and we can set up a nurse visit for a blood pressure check.    Xarelto Sample #14 Lot#: [id number] Exp: 09/2023

## 2022-01-30 NOTE — Progress Notes (Signed)
Cardiology Office Note    Date:  01/30/2022   ID:  Shlome, Baldree March 20, 1946, MRN [id number]  PCP:  Celene Squibb, MD  Cardiologist: Rozann Lesches, MD    Chief Complaint  Patient presents with   Follow-up    Overdue Visit    History of Present Illness:    Max Davenport is a 76 y.o. male with past medical history of permanent atrial fibrillation, HTN and HLD who presents to the office today for overdue follow-up.  He was last examined by Dr. Domenic Polite in 03/2021 and denied any recent chest pain or palpitations at that time.  He was continued on his current medications including Atorvastatin 20 mg daily, Losartan 100 mg daily, Toprol-XL 12.5 mg daily and Xarelto 15 mg daily.  He did have recent labs on 01/29/2022 which showed his creatinine had improved to 1.21 and based off of this, his calculated creatinine clearance was at 64 mL/min meaning his Xarelto dosing should be increased from 15 mg daily to 20 mg daily.  In talking with the patient today, he reports overall doing well since his last office visit. He does exercise at home with weights and denies any recent chest pain or dyspnea on exertion. No recent orthopnea, PND or pitting edema. He is unaware of his atrial fibrillation and denies any recent palpitations. He was having dizziness earlier this month which he felt was secondary to a sinus infection and symptoms improved with the use of Sudafed and Meclizine. Reports his BP is usually well controlled at home but is significantly elevated today.  Past Medical History:  Diagnosis Date   Atrial fibrillation ([place])    Essential hypertension    Hyperlipidemia    Skin cancer, basal cell    Type 2 diabetes mellitus ([place])     Past Surgical History:  Procedure Laterality Date   BASAL CELL CARCINOMA EXCISION     Top of head   INGUINAL HERNIA REPAIR Right    LUMBAR FUSION  2008   NASAL RECONSTRUCTION     WISDOM TOOTH EXTRACTION      Current Medications: Outpatient  Medications Prior to Visit  Medication Sig Dispense Refill   losartan (COZAAR) 100 MG tablet Take 100 mg by mouth daily.     meclizine (ANTIVERT) 25 MG tablet Take 25 mg by mouth 3 (three) times daily as needed.     metFORMIN (GLUCOPHAGE) 500 MG tablet Take 500 mg by mouth daily.     metoprolol succinate (TOPROL-XL) 25 MG 24 hr tablet TAKE 1/2 TABLET BY MOUTH DAILY. 15 tablet 0   tiZANidine (ZANAFLEX) 4 MG tablet Take 1 tablet (4 mg total) by mouth every 6 (six) hours as needed for muscle spasms. 30 tablet 0   Rivaroxaban (XARELTO) 15 MG TABS tablet TAKE 1 TABLET ONCE DAILY WITH SUPPER 30 tablet 1   atorvastatin (LIPITOR) 20 MG tablet Take 20 mg by mouth daily. (Patient not taking: Reported on 01/30/2022)     No facility-administered medications prior to visit.     Allergies:   Patient has no known allergies.   Social History   Socioeconomic History   Marital status: Single    Spouse name: Not on file   Number of children: Not on file   Years of education: Not on file   Highest education level: Not on file  Occupational History   Not on file  Tobacco Use   Smoking status: Never   Smokeless tobacco: Never  Vaping Use  Vaping Use: Never used  Substance and Sexual Activity   Alcohol use: Never   Drug use: Never   Sexual activity: Not on file  Other Topics Concern   Not on file  Social History Narrative   Not on file   Social Determinants of Health   Financial Resource Strain: Not on file  Food Insecurity: Not on file  Transportation Needs: Not on file  Physical Activity: Not on file  Stress: Not on file  Social Connections: Not on file     Family History:  The patient's family history is not on file. He was adopted.   Review of Systems:    Please see the history of present illness.     All other systems reviewed and are otherwise negative except as noted above.   Physical Exam:    VS:  BP (!) 178/110    Pulse 77    Ht 5\' 9"  (1.753 m)    Wt 220 lb 6.4 oz (100  kg)    SpO2 97%    BMI 32.55 kg/m    General: Well developed, well nourished,male appearing in no acute distress. Head: Normocephalic, atraumatic. Neck: No carotid bruits. JVD not elevated.  Lungs: Respirations regular and unlabored, without wheezes or rales.  Heart: Irregularly irregular. No S3 or S4.  No murmur, no rubs, or gallops appreciated. Abdomen: Appears non-distended. No obvious abdominal masses. Msk:  Strength and tone appear normal for age. No obvious joint deformities or effusions. Extremities: No clubbing or cyanosis. No pitting edema.  Distal pedal pulses are 2+ bilaterally. Neuro: Alert and oriented X 3. Moves all extremities spontaneously. No focal deficits noted. Psych:  Responds to questions appropriately with a normal affect. Skin: No rashes or lesions noted  Wt Readings from Last 3 Encounters:  01/30/22 220 lb 6.4 oz (100 kg)  04/11/21 225 lb (102.1 kg)  12/25/20 216 lb (98 kg)     Studies/Labs Reviewed:   EKG:  EKG is ordered today. The ekg ordered today demonstrates rate-controlled atrial fibrillation, heart rate 77 with right axis deviation.  Recent Labs: 01/29/2022: BUN 24; Creatinine, Ser 1.21; Hemoglobin 17.6; Platelets 185; Potassium 4.5; Sodium 138   Lipid Panel No results found for: CHOL, TRIG, HDL, CHOLHDL, VLDL, LDLCALC, LDLDIRECT  Additional studies/ records that were reviewed today include:   Echocardiogram: 10/2020 IMPRESSIONS     1. Left ventricular ejection fraction, by estimation, is 65 to 70%. The  left ventricle has normal function. The left ventricle has no regional  wall motion abnormalities. Left ventricular diastolic parameters are  indeterminate.   2. Right ventricular systolic function is normal. The right ventricular  size is normal. There is normal pulmonary artery systolic pressure.   3. Left atrial size was moderately dilated.   4. Right atrial size was moderately dilated.   5. The mitral valve is abnormal. Trivial mitral  valve regurgitation.   6. The aortic valve is abnormal. Aortic valve regurgitation is mild. Mild  aortic valve sclerosis is present, with no evidence of aortic valve  stenosis.   7. The inferior vena cava is normal in size with greater than 50%  respiratory variability, suggesting right atrial pressure of 3 mmHg.   Assessment:    1. Persistent atrial fibrillation ([place])   2. Current use of long term anticoagulation   3. Essential hypertension   4. Mixed hyperlipidemia      Plan:   In order of problems listed above:  1. Persistent Atrial Fibrillation/Use of Anticoagulation - He  denies any recent palpitations and his heart rate is well controlled in the 70's during today's visit. Continue Toprol-XL 12.5 mg daily for rate control. - He denies any evidence of active bleeding. Recent labs did show his creatinine had improved to 1.21 and his creatinine clearance is now greater than 50. Will plan to increase Xarelto from 15 mg daily to 20 mg daily.  2. HTN - His BP is significantly elevated today but he reports this was previously well controlled at home. He has been taking Sudafed routinely and I suspect this is the culprit. I advised him to discontinue the use of Sudafed and only use Coricidin or plain Mucinex.  He will keep a blood pressure log and report back with readings in 2 to 3 weeks.  If BP remains above goal, would consider the addition of Amlodipine at that time. He remains on Losartan 100 mg daily and Toprol-XL 12.5 mg daily.  3. HLD - He was previously on Atorvastatin 20 mg daily and Zetia 10 mg daily but reports stopping both as he was experiencing myalgias and noticed improvement in myalgias with the discontinuation of his medications. We reviewed that this was likely due to Atorvastatin and I encouraged him to try restarting Zetia 10 mg daily. Pending reassessment of follow-up labs, could consider low-dose Crestor 5 mg daily.   Medication Adjustments/Labs and Tests  Ordered: Current medicines are reviewed at length with the patient today.  Concerns regarding medicines are outlined above.  Medication changes, Labs and Tests ordered today are listed in the Patient Instructions below. Patient Instructions  Medication Instructions:   Restart Zetia 10mg  daily.   Xarelto increased to 20mg  daily.   *If you need a refill on your cardiac medications before your next appointment, please call your pharmacy*    Follow-Up: At [facility], you and your health needs are our priority.  As part of our continuing mission to provide you with exceptional heart care, we have created designated Provider Care Teams.  These Care Teams include your primary Cardiologist (physician) and Advanced Practice Providers (APPs -  Physician Assistants and Nurse Practitioners) who all work together to provide you with the care you need, when you need it.  We recommend signing up for the patient portal called "MyChart".  Sign up information is provided on this After Visit Summary.  MyChart is used to connect with patients for Virtual Visits (Telemedicine).  Patients are able to view lab/test results, encounter notes, upcoming appointments, etc.  Non-urgent messages can be sent to your provider as well.   To learn more about what you can do with MyChart, go to [email or web address].    Your next appointment:   6 month(s)  The format for your next appointment:   In Person  Provider:   You may see Rozann Lesches, MD or one of the following Advanced Practice Providers on your designated Care Team:   Bernerd Pho, PA-C  Ermalinda Barrios, PA-C     Other Instructions  Check Blood Pressure at home. Report back with readings in 2-3 weeks. If unable to check at home, please let us know and we can set up a nurse visit for a blood pressure check.    Xarelto Sample #14 Lot#: [id number] Exp: 09/2023      Signed, Erma Heritage, PA-C  01/30/2022 4:38 PM    [facility] [address] [place],  [address] Phone: [phone] Fax: [phone]

## 2022-03-14 DEATH — deceased

## 2022-04-30 DIAGNOSIS — E782 Mixed hyperlipidemia: Secondary | ICD-10-CM | POA: Diagnosis not present

## 2022-04-30 DIAGNOSIS — R7301 Impaired fasting glucose: Secondary | ICD-10-CM | POA: Diagnosis not present

## 2022-04-30 DIAGNOSIS — N1832 Chronic kidney disease, stage 3b: Secondary | ICD-10-CM | POA: Diagnosis not present

## 2022-04-30 DIAGNOSIS — Z125 Encounter for screening for malignant neoplasm of prostate: Secondary | ICD-10-CM | POA: Diagnosis not present

## 2022-04-30 DIAGNOSIS — I10 Essential (primary) hypertension: Secondary | ICD-10-CM | POA: Diagnosis not present

## 2022-05-05 ENCOUNTER — Other Ambulatory Visit: Payer: Self-pay | Admitting: Cardiology

## 2022-05-05 DIAGNOSIS — Z6830 Body mass index (BMI) 30.0-30.9, adult: Secondary | ICD-10-CM | POA: Diagnosis not present

## 2022-05-05 DIAGNOSIS — R809 Proteinuria, unspecified: Secondary | ICD-10-CM | POA: Diagnosis not present

## 2022-05-05 DIAGNOSIS — L989 Disorder of the skin and subcutaneous tissue, unspecified: Secondary | ICD-10-CM | POA: Diagnosis not present

## 2022-05-05 DIAGNOSIS — D751 Secondary polycythemia: Secondary | ICD-10-CM | POA: Diagnosis not present

## 2022-05-05 DIAGNOSIS — Z0001 Encounter for general adult medical examination with abnormal findings: Secondary | ICD-10-CM | POA: Diagnosis not present

## 2022-05-05 DIAGNOSIS — N1832 Chronic kidney disease, stage 3b: Secondary | ICD-10-CM | POA: Diagnosis not present

## 2022-05-05 DIAGNOSIS — N400 Enlarged prostate without lower urinary tract symptoms: Secondary | ICD-10-CM | POA: Diagnosis not present

## 2022-05-05 DIAGNOSIS — E1122 Type 2 diabetes mellitus with diabetic chronic kidney disease: Secondary | ICD-10-CM | POA: Diagnosis not present

## 2022-05-05 DIAGNOSIS — I4819 Other persistent atrial fibrillation: Secondary | ICD-10-CM | POA: Diagnosis not present

## 2022-05-05 DIAGNOSIS — E6609 Other obesity due to excess calories: Secondary | ICD-10-CM | POA: Diagnosis not present

## 2022-05-05 DIAGNOSIS — E782 Mixed hyperlipidemia: Secondary | ICD-10-CM | POA: Diagnosis not present

## 2022-05-05 DIAGNOSIS — I129 Hypertensive chronic kidney disease with stage 1 through stage 4 chronic kidney disease, or unspecified chronic kidney disease: Secondary | ICD-10-CM | POA: Diagnosis not present

## 2022-09-07 NOTE — Progress Notes (Deleted)
Cardiology Office Note  Date: 09/07/2022   ID: Max Davenport, Yuan 09-Apr-1946, MRN [id number]  PCP:  Celene Squibb, MD  Cardiologist:  Rozann Lesches, MD Electrophysiologist:  None   No chief complaint on file.   History of Present Illness: Max Davenport is a 76 y.o. male last seen in February by Ms. Strader PA-C, I reviewed the note.  Past Medical History:  Diagnosis Date   Atrial fibrillation ([place])    Essential hypertension    Hyperlipidemia    Skin cancer, basal cell    Type 2 diabetes mellitus ([place])     Past Surgical History:  Procedure Laterality Date   BASAL CELL CARCINOMA EXCISION     Top of head   INGUINAL HERNIA REPAIR Right    LUMBAR FUSION  2008   NASAL RECONSTRUCTION     WISDOM TOOTH EXTRACTION      Current Outpatient Medications  Medication Sig Dispense Refill   ezetimibe (ZETIA) 10 MG tablet Take 1 tablet (10 mg total) by mouth daily. 90 tablet 3   losartan (COZAAR) 100 MG tablet Take 100 mg by mouth daily.     meclizine (ANTIVERT) 25 MG tablet Take 25 mg by mouth 3 (three) times daily as needed.     metFORMIN (GLUCOPHAGE) 500 MG tablet Take 500 mg by mouth daily.     metoprolol succinate (TOPROL-XL) 25 MG 24 hr tablet TAKE 1/2 TABLET BY MOUTH DAILY. 45 tablet 3   rivaroxaban (XARELTO) 20 MG TABS tablet Take 1 tablet (20 mg total) by mouth daily with supper. 30 tablet 11   tiZANidine (ZANAFLEX) 4 MG tablet Take 1 tablet (4 mg total) by mouth every 6 (six) hours as needed for muscle spasms. 30 tablet 0   No current facility-administered medications for this visit.   Allergies:  Patient has no known allergies.   Social History: The patient  reports that he has never smoked. He has never used smokeless tobacco. He reports that he does not drink alcohol and does not use drugs.   Family History: The patient's family history is not on file. He was adopted.   ROS:  Please see the history of present illness. Otherwise, complete review of systems is positive  for {NONE DEFAULTED:18576}.  All other systems are reviewed and negative.   Physical Exam: VS:  There were no vitals taken for this visit., BMI There is no height or weight on file to calculate BMI.  Wt Readings from Last 3 Encounters:  01/30/22 220 lb 6.4 oz (100 kg)  04/11/21 225 lb (102.1 kg)  12/25/20 216 lb (98 kg)    General: Patient appears comfortable at rest. HEENT: Conjunctiva and lids normal, oropharynx clear with moist mucosa. Neck: Supple, no elevated JVP or carotid bruits, no thyromegaly. Lungs: Clear to auscultation, nonlabored breathing at rest. Cardiac: Regular rate and rhythm, no S3 or significant systolic murmur, no pericardial rub. Abdomen: Soft, nontender, no hepatomegaly, bowel sounds present, no guarding or rebound. Extremities: No pitting edema, distal pulses 2+. Skin: Warm and dry. Musculoskeletal: No kyphosis. Neuropsychiatric: Alert and oriented x3, affect grossly appropriate.  ECG:  An ECG dated 01/30/2022 was personally reviewed today and demonstrated:  Atrial fibrillation with controlled ventricular response.  Recent Labwork: 01/29/2022: BUN 24; Creatinine, Ser 1.21; Hemoglobin 17.6; Platelets 185; Potassium 4.5; Sodium 138   Other Studies Reviewed Today:  Echocardiogram 10/29/2020:  1. Left ventricular ejection fraction, by estimation, is 65 to 70%. The  left ventricle has normal function. The left  ventricle has no regional  wall motion abnormalities. Left ventricular diastolic parameters are  indeterminate.   2. Right ventricular systolic function is normal. The right ventricular  size is normal. There is normal pulmonary artery systolic pressure.   3. Left atrial size was moderately dilated.   4. Right atrial size was moderately dilated.   5. The mitral valve is abnormal. Trivial mitral valve regurgitation.   6. The aortic valve is abnormal. Aortic valve regurgitation is mild. Mild  aortic valve sclerosis is present, with no evidence of aortic  valve  stenosis.   7. The inferior vena cava is normal in size with greater than 50%  respiratory variability, suggesting right atrial pressure of 3 mmHg.   Assessment and Plan:   Medication Adjustments/Labs and Tests Ordered: Current medicines are reviewed at length with the patient today.  Concerns regarding medicines are outlined above.   Tests Ordered: No orders of the defined types were placed in this encounter.   Medication Changes: No orders of the defined types were placed in this encounter.   Disposition:  Follow up {follow up:15908}  Signed, Satira Sark, MD, [facility] 09/07/2022 5:19 PM    [place] at [place]. [address], [place], [place] [address] Phone: [phone]; Fax: [phone]

## 2022-09-08 ENCOUNTER — Ambulatory Visit: Payer: Medicare Other | Admitting: Cardiology

## 2022-09-08 DIAGNOSIS — I4821 Permanent atrial fibrillation: Secondary | ICD-10-CM

## 2022-09-17 DIAGNOSIS — R5383 Other fatigue: Secondary | ICD-10-CM | POA: Insufficient documentation

## 2022-09-17 DIAGNOSIS — R059 Cough, unspecified: Secondary | ICD-10-CM | POA: Diagnosis not present

## 2022-09-17 DIAGNOSIS — J069 Acute upper respiratory infection, unspecified: Secondary | ICD-10-CM | POA: Insufficient documentation

## 2022-09-17 DIAGNOSIS — I10 Essential (primary) hypertension: Secondary | ICD-10-CM | POA: Diagnosis not present

## 2022-10-13 DIAGNOSIS — R7301 Impaired fasting glucose: Secondary | ICD-10-CM | POA: Diagnosis not present

## 2022-10-13 DIAGNOSIS — I10 Essential (primary) hypertension: Secondary | ICD-10-CM | POA: Diagnosis not present

## 2022-10-13 DIAGNOSIS — R5383 Other fatigue: Secondary | ICD-10-CM | POA: Diagnosis not present

## 2022-10-19 DIAGNOSIS — E6609 Other obesity due to excess calories: Secondary | ICD-10-CM | POA: Diagnosis not present

## 2022-10-19 DIAGNOSIS — E1122 Type 2 diabetes mellitus with diabetic chronic kidney disease: Secondary | ICD-10-CM | POA: Diagnosis not present

## 2022-10-19 DIAGNOSIS — N1832 Chronic kidney disease, stage 3b: Secondary | ICD-10-CM | POA: Diagnosis not present

## 2022-10-19 DIAGNOSIS — I129 Hypertensive chronic kidney disease with stage 1 through stage 4 chronic kidney disease, or unspecified chronic kidney disease: Secondary | ICD-10-CM | POA: Diagnosis not present

## 2022-10-19 DIAGNOSIS — E782 Mixed hyperlipidemia: Secondary | ICD-10-CM | POA: Diagnosis not present

## 2022-10-19 DIAGNOSIS — R809 Proteinuria, unspecified: Secondary | ICD-10-CM | POA: Diagnosis not present

## 2022-10-19 DIAGNOSIS — R42 Dizziness and giddiness: Secondary | ICD-10-CM | POA: Insufficient documentation

## 2022-10-19 DIAGNOSIS — N400 Enlarged prostate without lower urinary tract symptoms: Secondary | ICD-10-CM | POA: Diagnosis not present

## 2022-10-19 DIAGNOSIS — D751 Secondary polycythemia: Secondary | ICD-10-CM | POA: Diagnosis not present

## 2022-10-19 DIAGNOSIS — G4700 Insomnia, unspecified: Secondary | ICD-10-CM | POA: Insufficient documentation

## 2022-10-19 DIAGNOSIS — I4819 Other persistent atrial fibrillation: Secondary | ICD-10-CM | POA: Diagnosis not present

## 2022-10-19 DIAGNOSIS — L989 Disorder of the skin and subcutaneous tissue, unspecified: Secondary | ICD-10-CM | POA: Diagnosis not present

## 2022-10-19 DIAGNOSIS — Z23 Encounter for immunization: Secondary | ICD-10-CM | POA: Diagnosis not present

## 2022-10-19 DIAGNOSIS — B351 Tinea unguium: Secondary | ICD-10-CM | POA: Insufficient documentation

## 2022-10-19 NOTE — Progress Notes (Unsigned)
Cardiology Office Note  Date: 10/20/2022   ID: Max Davenport, DOB 19-Oct-1946, MRN [id number]  PCP:  Celene Squibb, MD  Cardiologist:  Rozann Lesches, MD Electrophysiologist:  None   Chief Complaint  Patient presents with   Cardiac follow-up    History of Present Illness: Max Davenport is a 76 y.o. male last seen in February by Ms. Strader PA-C, I reviewed the note.  He is here for a routine visit.  Reports no sense of palpitations or exertional chest pain.  He has had some trouble with recent dizziness/vertigo, started on meclizine by PCP and referred to ENT.  He also had recent lab work which we are requesting for review.  We went over his medications.  Heart rate looks to be fairly well controlled in atrial fibrillation on Toprol-XL at low-dose.  He does not describe any spontaneous bleeding problems on Xarelto.  I did review his home blood pressure checks which look fairly reasonable with systolics largely between 120 and 140.  Past Medical History:  Diagnosis Date   Atrial fibrillation ([place])    Essential hypertension    Hyperlipidemia    Skin cancer, basal cell    Type 2 diabetes mellitus ([place])     Past Surgical History:  Procedure Laterality Date   BASAL CELL CARCINOMA EXCISION     Top of head   INGUINAL HERNIA REPAIR Right    LUMBAR FUSION  2008   NASAL RECONSTRUCTION     WISDOM TOOTH EXTRACTION      Current Outpatient Medications  Medication Sig Dispense Refill   atorvastatin (LIPITOR) 20 MG tablet Take 1 tablet every day by oral route at bedtime.     ezetimibe (ZETIA) 10 MG tablet Take 1 tablet (10 mg total) by mouth daily. 90 tablet 3   losartan (COZAAR) 100 MG tablet Take 100 mg by mouth daily.     meclizine (ANTIVERT) 25 MG tablet Take 25 mg by mouth 3 (three) times daily as needed.     metoprolol succinate (TOPROL-XL) 25 MG 24 hr tablet TAKE 1/2 TABLET BY MOUTH DAILY. 45 tablet 3   rivaroxaban (XARELTO) 20 MG TABS tablet Take 1 tablet (20 mg total) by  mouth daily with supper. 30 tablet 11   tamsulosin (FLOMAX) 0.4 MG CAPS capsule Take 1 capsule every day by oral route.     No current facility-administered medications for this visit.   Allergies:  Patient has no known allergies.   ROS:  No syncope.  Physical Exam: VS:  BP (!) 144/84   Pulse 68   Ht '5\' 9"'$  (1.753 m)   Wt 210 lb (95.3 kg)   SpO2 97%   BMI 31.01 kg/m , BMI Body mass index is 31.01 kg/m.  Wt Readings from Last 3 Encounters:  10/20/22 210 lb (95.3 kg)  01/30/22 220 lb 6.4 oz (100 kg)  04/11/21 225 lb (102.1 kg)    General: Patient appears comfortable at rest. HEENT: Conjunctiva and lids normal. Neck: Supple, no elevated JVP or carotid bruits. Lungs: Clear to auscultation, nonlabored breathing at rest. Cardiac: Currently irregular, no significant murmur or gallop.. Abdomen: Soft, nontender, bowel sounds present. Extremities: No pitting edema.  ECG:  An ECG dated 01/30/2022 was personally reviewed today and demonstrated:  Atrial fibrillation.  Recent Labwork: 01/29/2022: BUN 24; Creatinine, Ser 1.21; Hemoglobin 17.6; Platelets 185; Potassium 4.5; Sodium 138   Other Studies Reviewed Today:  Echocardiogram 10/29/2020:  1. Left ventricular ejection fraction, by estimation, is 65 to 70%.  The  left ventricle has normal function. The left ventricle has no regional  wall motion abnormalities. Left ventricular diastolic parameters are  indeterminate.   2. Right ventricular systolic function is normal. The right ventricular  size is normal. There is normal pulmonary artery systolic pressure.   3. Left atrial size was moderately dilated.   4. Right atrial size was moderately dilated.   5. The mitral valve is abnormal. Trivial mitral valve regurgitation.   6. The aortic valve is abnormal. Aortic valve regurgitation is mild. Mild  aortic valve sclerosis is present, with no evidence of aortic valve  stenosis.   7. The inferior vena cava is normal in size with greater  than 50%  respiratory variability, suggesting right atrial pressure of 3 mmHg.   Assessment and Plan:  1.  Permanent atrial fibrillation with CHA2DS2-VASc score of 3.  He is asymptomatic in terms of palpitations.  Continue Toprol-XL for heart rate control and Xarelto for stroke prophylaxis.  No reported spontaneous bleeding problems.  Requesting interval lab work from PCP for review.  2.  Essential hypertension, no changes made to current regimen.  Continue losartan.  Medication Adjustments/Labs and Tests Ordered: Current medicines are reviewed at length with the patient today.  Concerns regarding medicines are outlined above.   Tests Ordered: No orders of the defined types were placed in this encounter.   Medication Changes: No orders of the defined types were placed in this encounter.   Disposition:  Follow up  6 months.  Signed, Satira Sark, MD, [facility] 10/20/2022 1:19 PM    [place] at [facility] [address], [place],  [address] Phone: [phone]; Fax: [phone]

## 2022-10-20 ENCOUNTER — Ambulatory Visit: Payer: Medicare Other | Attending: Cardiology | Admitting: Cardiology

## 2022-10-20 ENCOUNTER — Encounter: Payer: Self-pay | Admitting: Cardiology

## 2022-10-20 VITALS — BP 144/84 | HR 68 | Ht 69.0 in | Wt 210.0 lb

## 2022-10-20 DIAGNOSIS — I4821 Permanent atrial fibrillation: Secondary | ICD-10-CM | POA: Insufficient documentation

## 2022-10-20 DIAGNOSIS — I10 Essential (primary) hypertension: Secondary | ICD-10-CM | POA: Insufficient documentation

## 2022-10-20 NOTE — Patient Instructions (Signed)
Medication Instructions:  Your physician recommends that you continue on your current medications as directed. Please refer to the Current Medication list given to you today.   Labwork: None  Testing/Procedures: None  Follow-Up: Follow up with Dr. McDowell in 6 months.   Any Other Special Instructions Will Be Listed Below (If Applicable).     If you need a refill on your cardiac medications before your next appointment, please call your pharmacy.  

## 2022-10-21 ENCOUNTER — Encounter: Payer: Self-pay | Admitting: Internal Medicine

## 2022-10-22 ENCOUNTER — Telehealth: Payer: Self-pay

## 2022-10-22 MED ORDER — RIVAROXABAN 15 MG PO TABS: 15.0000 mg | ORAL_TABLET | Freq: Two times a day (BID) | ORAL | 11 refills | Status: DC

## 2022-10-22 NOTE — Telephone Encounter (Signed)
-----  Message from Satira Sark, MD sent at 10/21/2022  9:19 AM EST ----- Results reviewed.  Recent lab work from PCP shows creatinine 2.2 with EGFR 30.  Based on current creatinine clearance Xarelto should be reduced to 15 mg daily.

## 2022-10-22 NOTE — Telephone Encounter (Signed)
I spoke with patient and discussed lab results. He agrees to decrease Xarelto to 15 mg qd.I e-scribed rx to [facility].

## 2022-11-16 DIAGNOSIS — N1832 Chronic kidney disease, stage 3b: Secondary | ICD-10-CM | POA: Diagnosis not present

## 2023-02-17 DIAGNOSIS — E782 Mixed hyperlipidemia: Secondary | ICD-10-CM | POA: Diagnosis not present

## 2023-02-17 DIAGNOSIS — R809 Proteinuria, unspecified: Secondary | ICD-10-CM | POA: Diagnosis not present

## 2023-02-24 ENCOUNTER — Encounter: Payer: Self-pay | Admitting: Family Medicine

## 2023-02-24 DIAGNOSIS — N189 Chronic kidney disease, unspecified: Secondary | ICD-10-CM | POA: Diagnosis not present

## 2023-02-24 DIAGNOSIS — G4700 Insomnia, unspecified: Secondary | ICD-10-CM | POA: Diagnosis not present

## 2023-02-24 DIAGNOSIS — R5383 Other fatigue: Secondary | ICD-10-CM | POA: Diagnosis not present

## 2023-02-24 DIAGNOSIS — E138 Other specified diabetes mellitus with unspecified complications: Secondary | ICD-10-CM | POA: Insufficient documentation

## 2023-02-24 DIAGNOSIS — D751 Secondary polycythemia: Secondary | ICD-10-CM | POA: Diagnosis not present

## 2023-02-24 DIAGNOSIS — N400 Enlarged prostate without lower urinary tract symptoms: Secondary | ICD-10-CM | POA: Diagnosis not present

## 2023-02-24 DIAGNOSIS — I129 Hypertensive chronic kidney disease with stage 1 through stage 4 chronic kidney disease, or unspecified chronic kidney disease: Secondary | ICD-10-CM | POA: Diagnosis not present

## 2023-02-24 DIAGNOSIS — E1169 Type 2 diabetes mellitus with other specified complication: Secondary | ICD-10-CM | POA: Diagnosis not present

## 2023-02-24 DIAGNOSIS — I4891 Unspecified atrial fibrillation: Secondary | ICD-10-CM | POA: Diagnosis not present

## 2023-02-24 DIAGNOSIS — R809 Proteinuria, unspecified: Secondary | ICD-10-CM | POA: Diagnosis not present

## 2023-02-24 DIAGNOSIS — E1159 Type 2 diabetes mellitus with other circulatory complications: Secondary | ICD-10-CM | POA: Diagnosis not present

## 2023-02-24 DIAGNOSIS — E1122 Type 2 diabetes mellitus with diabetic chronic kidney disease: Secondary | ICD-10-CM | POA: Diagnosis not present

## 2023-02-24 DIAGNOSIS — E782 Mixed hyperlipidemia: Secondary | ICD-10-CM | POA: Diagnosis not present

## 2023-03-08 ENCOUNTER — Other Ambulatory Visit: Payer: Self-pay | Admitting: Student

## 2023-03-17 DIAGNOSIS — R42 Dizziness and giddiness: Secondary | ICD-10-CM | POA: Diagnosis not present

## 2023-03-17 DIAGNOSIS — G4700 Insomnia, unspecified: Secondary | ICD-10-CM | POA: Diagnosis not present

## 2023-03-17 DIAGNOSIS — D751 Secondary polycythemia: Secondary | ICD-10-CM | POA: Diagnosis not present

## 2023-03-17 DIAGNOSIS — N1832 Chronic kidney disease, stage 3b: Secondary | ICD-10-CM | POA: Diagnosis not present

## 2023-03-17 DIAGNOSIS — E6609 Other obesity due to excess calories: Secondary | ICD-10-CM | POA: Diagnosis not present

## 2023-03-17 DIAGNOSIS — N400 Enlarged prostate without lower urinary tract symptoms: Secondary | ICD-10-CM | POA: Diagnosis not present

## 2023-03-17 DIAGNOSIS — E1169 Type 2 diabetes mellitus with other specified complication: Secondary | ICD-10-CM | POA: Diagnosis not present

## 2023-03-17 DIAGNOSIS — I4819 Other persistent atrial fibrillation: Secondary | ICD-10-CM | POA: Diagnosis not present

## 2023-03-17 DIAGNOSIS — I129 Hypertensive chronic kidney disease with stage 1 through stage 4 chronic kidney disease, or unspecified chronic kidney disease: Secondary | ICD-10-CM | POA: Diagnosis not present

## 2023-03-17 DIAGNOSIS — R5383 Other fatigue: Secondary | ICD-10-CM | POA: Diagnosis not present

## 2023-03-17 DIAGNOSIS — R809 Proteinuria, unspecified: Secondary | ICD-10-CM | POA: Diagnosis not present

## 2023-04-05 ENCOUNTER — Encounter: Payer: Self-pay | Admitting: Internal Medicine

## 2023-04-08 ENCOUNTER — Ambulatory Visit: Payer: HMO | Attending: Cardiology | Admitting: Cardiology

## 2023-04-08 ENCOUNTER — Encounter: Payer: Self-pay | Admitting: Cardiology

## 2023-04-08 VITALS — BP 154/90 | HR 64 | Ht 69.0 in | Wt 218.0 lb

## 2023-04-08 DIAGNOSIS — I10 Essential (primary) hypertension: Secondary | ICD-10-CM | POA: Diagnosis not present

## 2023-04-08 DIAGNOSIS — R42 Dizziness and giddiness: Secondary | ICD-10-CM

## 2023-04-08 DIAGNOSIS — I4821 Permanent atrial fibrillation: Secondary | ICD-10-CM | POA: Diagnosis not present

## 2023-04-08 DIAGNOSIS — E782 Mixed hyperlipidemia: Secondary | ICD-10-CM | POA: Diagnosis not present

## 2023-04-08 MED ORDER — LOSARTAN POTASSIUM 50 MG PO TABS: 50.0000 mg | ORAL_TABLET | Freq: Two times a day (BID) | ORAL | 3 refills | Status: DC

## 2023-04-08 NOTE — Progress Notes (Signed)
    Cardiology Office Note  Date: 04/08/2023   ID: Yarnell, Arvidson 24-Mar-1946, MRN [id number]  History of Present Illness: SHA AMER is a 77 y.o. male last seen in November 2023.  He presents for a follow-up visit with concerns about dizziness.  This seems to be a fairly longstanding problem.  Not always orthostatic, but sometimes after he stands, seems to feel off balance at other times as well, sometimes with other position changes, never associated with palpitations or chest pain.  He has not had any syncope.  Has had prior inner ear problems and was prescribed meclizine as needed.  We went over his home blood pressure and heart rate checks, he has not had any consistent hypotension in association with symptoms and his heart rate looks to be well-controlled in atrial fibrillation as it is today.  He exercises regularly with no new exertional symptoms.  Orthostatic measurements were made today and were normal.  Supine blood pressure 159/95 with heart rate 68, seated blood pressure 159/90 with heart rate 76, and standing blood pressure 153/89 with heart rates 57.  We went over his current medications.  ECG today shows rate controlled atrial fibrillation in the 60s.  I reviewed his recent lab work.  Physical Exam: VS:  BP (!) 154/90   Pulse 64   Ht  (1.753 m)   Wt 218 lb (98.9 kg)   SpO2 98%   BMI 32.19 kg/m , BMI Body mass index is 32.19 kg/m.  Wt Readings from Last 3 Encounters:  04/08/23 218 lb (98.9 kg)  10/20/22 210 lb (95.3 kg)  01/30/22 220 lb 6.4 oz (100 kg)    General: Patient appears comfortable at rest. HEENT: Conjunctiva and lids normal. Neck: Supple, no elevated JVP or carotid bruits. Lungs: Clear to auscultation, nonlabored breathing at rest. Cardiac: Irregularly irregular, no S3, 1/6 systolic murmur. Extremities: No pitting edema.  ECG:  An ECG dated 01/30/2022 was personally reviewed today and demonstrated:  Atrial fibrillation.  Labwork:  March 2024:  Cholesterol 218, triglycerides 317, HDL 43, LDL 120 April 2024: Hemoglobin 16.7, platelets 192, BUN 21, creatinine 1.93, potassium 5.2, AST 20, ALT 17, hemoglobin 1C 7%  Other Studies Reviewed Today:  No interval cardiac testing for review today.  Assessment and Plan:  1.  Permanent atrial fibrillation with CHA2DS2-VASc score of 3.  Heart rate well-controlled, plan to continue Toprol-XL 12.5 mg daily.  He is also on Xarelto for stroke prophylaxis and does not indicate any spontaneous bleeding problems.  I reviewed his recent lab work.  2.  Chronic recurring dizziness and vertigo.  Sounds more potentially vestibular in nature, he was not orthostatic either by blood pressure or pulse check today.  Not clearly medication related either, he does not report any association with hypotension.  Plan to refer him for PT evaluation of balance/vertigo.  2.  Essential hypertension.  Continue Cozaar 50 mg twice daily along with Toprol-XL 12 and half milligrams daily.  3.  Mixed hyperlipidemia.  He is on Lipitor and Zetia with follow-up by [name].  Disposition:  Follow up  6 months.  Signed, Jonelle Sidle, M.D., F.A.C.C. [facility] at [facility]

## 2023-04-08 NOTE — Patient Instructions (Signed)
Medication Instructions:   Re-start Toprol XL 12.5 mg daily  Labwork: None today  Testing/Procedures: None today  Follow-Up: 6 months  Any Other Special Instructions Will Be Listed Below (If Applicable).   You have been referred to Physical therapy to assess balance,vertigo issues. They will call you to schedule an appointment.   If you need a refill on your cardiac medications before your next appointment, please call your pharmacy.

## 2023-04-22 ENCOUNTER — Ambulatory Visit (HOSPITAL_COMMUNITY): Payer: HMO | Attending: Cardiology | Admitting: Physical Therapy

## 2023-04-22 DIAGNOSIS — R42 Dizziness and giddiness: Secondary | ICD-10-CM

## 2023-04-22 NOTE — Therapy (Signed)
OUTPATIENT PHYSICAL THERAPY VESTIBULAR EVALUATION     Patient Name: Max Davenport MRN: [id number] DOB:1946/03/16, 77 y.o., male Today's Date: 04/22/2023  END OF SESSION:  PT End of Session - 04/22/23 1518     Visit Number 1    Number of Visits 3    Date for PT Re-Evaluation 05/20/23    Authorization Type [employer]    PT Start Time 1348    PT Stop Time 1433    PT Time Calculation (min) 45 min    Activity Tolerance Patient tolerated treatment well    Behavior During Therapy WFL for tasks assessed/performed             Past Medical History:  Diagnosis Date   Atrial fibrillation ([facility])    Essential hypertension    Hyperlipidemia    Skin cancer, basal cell    Type 2 diabetes mellitus ([facility])    Past Surgical History:  Procedure Laterality Date   BASAL CELL CARCINOMA EXCISION     Top of head   INGUINAL HERNIA REPAIR Right    LUMBAR FUSION  2008   NASAL RECONSTRUCTION     WISDOM TOOTH EXTRACTION     Patient Active Problem List   Diagnosis Date Noted   Persistent atrial fibrillation ([facility]) 01/15/2021    PCP: Nita Sells MD REFERRING PROVIDER: Jonelle Sidle, MD  REFERRING DIAG: balance,vertigo issues  THERAPY DIAG:  Dizziness and giddiness  ONSET DATE: 2 months ago   Rationale for Evaluation and Treatment: Rehabilitation  SUBJECTIVE:   SUBJECTIVE STATEMENT: Patient presents to therapy with complaint of vertigo. This began insidiously about 2 months ago. He was walking around outside, got sick and threw up. It hasn't been that bad since then. He reports spontaneous dizziness when he is standing and walking. Sitting is fine. He reports A-fib and does monitor his BP. This may be relevant to symptoms. HE later notes that he sometimes gets blurred vision and recently experience LT sided UE weakness. This appears to have resolved since then.  Pt accompanied by: self  PERTINENT HISTORY: Hx of CX, HTN, A-fib, DM   PAIN:  Are you having pain?  No  PRECAUTIONS: Fall  WEIGHT BEARING RESTRICTIONS: No  FALLS: Has patient fallen in last 6 months? No  LIVING ENVIRONMENT: Lives with: lives alone Lives in: House/apartment Stairs: Yes: External: 4 steps; on left going up Has following equipment at home: Single point cane and Grab bars  PLOF: Independent with basic ADLs  PATIENT GOALS: Get rid of dizziness, improve balance   OBJECTIVE:   DIAGNOSTIC FINDINGS: NA  COGNITION: Overall cognitive status: Within functional limits for tasks assessed   SENSATION: WFL  Cervical ROM:    Active A/PROM (deg) eval  Flexion WNL  Extension WNL  Right lateral flexion   Left lateral flexion   Right rotation WNL  Left rotation WNL  (Blank rows = not tested)  STRENGTH: 4+-5/5 throughout   BED MOBILITY:  Sit to supine Complete Independence Supine to sit Complete Independence Rolling to Right Complete Independence Rolling to Left Complete Independence   TRANSFERS: Assistive device utilized: None  Sit to stand: Complete Independence Stand to sit: Complete Independence Chair to chair: Complete Independence   VESTIBULAR ASSESSMENT:    SYMPTOM BEHAVIOR:  Subjective history: See above  Non-Vestibular symptoms: changes in vision and nausea/vomiting  Type of dizziness: "Funny feeling in the head"  Frequency: daily  Duration: unclear   Aggravating factors:  standing, walking   Relieving factors:  sitting still  Progression of symptoms: unchanged  OCULOMOTOR EXAM:  Ocular Alignment: normal  Ocular ROM: No Limitations  Spontaneous Nystagmus: absent  Smooth Pursuits: intact  VESTIBULAR - OCULAR REFLEX:   Slow VOR: Normal  MOTION SENSITIVITY:  Motion Sensitivity Quotient Intensity: 0 = none, 1 = Lightheaded, 2 = Mild, 3 = Moderate, 4 = Severe, 5 = Vomiting  Intensity  1. Sitting to supine 0  2. Supine to L side 0  3. Supine to R side 0  4. Supine to sitting 0  5. L Hallpike-Dix   6. Up from L    7. R Hallpike-Dix    8. Up from R    9. Sitting, head tipped to L knee   10. Head up from L knee   11. Sitting, head tipped to R knee   12. Head up from R knee   13. Sitting head turns x5 0  14.Sitting head nods x5 0  15. In stance, 180 turn to L    16. In stance, 180 turn to R    BALANCE: Tandem: >30 seconds bilat SLS: 10 sec RT; 8 sec LT    VESTIBULAR TREATMENT:                                                                                                   DATE:  04/22/23 Eval Seated head nods x 5 Seated head turns x 5 Tandem stance x 30" each  SLS x10" each  NBOS with head turns x 5 NBOS with head nods  x 5   PATIENT EDUCATION: Education details: on eval findings, POC and HEP  Person educated: Patient Education method: Explanation Education comprehension: verbalized understanding  HOME EXERCISE PROGRAM: Access Code: [email or web address] URL: [email or web address] Date: 04/22/2023 Prepared by: Georges Lynch  Exercises - Heel Raises with Counter Support  - 1-2 x daily - 7 x weekly - 2 sets - 10 reps - Sit to Stand Without Arm Support  - 1-2 x daily - 7 x weekly - 2 sets - 10 reps - Standing Tandem Balance with Counter Support  - 1-2 x daily - 7 x weekly - 1 sets - 3 reps - 30 second hold - Single Leg Stance with Support  - 1-2 x daily - 7 x weekly - 1 sets - 4 reps - 10 second hold - Standing Hip Abduction  - 1-2 x daily - 7 x weekly - 2 sets - 10 reps - Standing Hip Extension  - 1-2 x daily - 7 x weekly - 2 sets - 10 reps - Romberg Stance with Head Nods  - 1-2 x daily - 7 x weekly - 2 sets - 10 reps - Romberg Stance with Head Rotation  - 1-2 x daily - 7 x weekly - 2 sets - 10 reps   GOALS: SHORT TERM GOALS: Target date: 05/06/2023  Patient will be independent with initial HEP and self-management strategies to improve functional outcomes Baseline:  Goal status: INITIAL   LONG TERM GOALS: Target date: 05/20/2023  Patient will be independent with advanced HEP and  self-management strategies to improve functional outcomes  Baseline:  Goal status: INITIAL  Patient will be able to hold SLS at least 12 seconds bilateral to demo improved balance and reduced risk for future falls.  Baseline: Tandem: >30 seconds bilat SLS: 10 sec RT; 8 sec LT  Goal status: INITIAL   ASSESSMENT:  CLINICAL IMPRESSION: Patient is a 77 y.o. male who presents to physical therapy with complaint of vertigo. Patient demonstrates no vestibular symptoms with provocative testing, but does show slight imbalance  which is negatively impacting patient ability to perform ADLs and functional mobility tasks. Assessed patient MMT and sensation per subjective and suspicion of possible TIA. All measures were WNL at present. Discussed follow up with PCP about this, as patient states he has not shared this with primary care. Also discussed awareness and monitoring of persistent/ worsening neuro sx to contact PCP or ED. At present sx appear at baseline, but would like to work on balance and comprehensive HEP development for improved level of function with ADLs, functional mobility tasks, and reduce risk for falls.  OBJECTIVE IMPAIRMENTS: Abnormal gait, decreased balance, and dizziness.   ACTIVITY LIMITATIONS: lifting, standing, squatting, stairs, transfers, and locomotion level  PARTICIPATION LIMITATIONS: cleaning, laundry, shopping, community activity, and yard work  PERSONAL FACTORS: Age and 3+ comorbidities: See above  are also affecting patient's functional outcome.   REHAB POTENTIAL: Good  CLINICAL DECISION MAKING: Evolving/moderate complexity  EVALUATION COMPLEXITY: Moderate   PLAN:  PT FREQUENCY: 1x/week  PT DURATION: 3 weeks  PLANNED INTERVENTIONS: Therapeutic exercises, Therapeutic activity, Neuromuscular re-education, Balance training, Gait training, Patient/Family education, Self Care, Joint mobilization, Vestibular training, and Canalith repositioning Therapeutic exercises,  Therapeutic activity, Neuromuscular re-education, Balance training, Gait training, Patient/Family education, Joint manipulation, Joint mobilization, Stair training, Aquatic Therapy, Dry Needling, Electrical stimulation, Spinal manipulation, Spinal mobilization, Cryotherapy, Moist heat, scar mobilization, Taping, Traction, Ultrasound, Biofeedback, Ionotophoresis 4mg /ml Dexamethasone, and Manual therapy.   PLAN FOR NEXT SESSION: Progress functional strength, balance and habituation as needed. HEP development. Monitor for any progressive neuro/ ref flag sx.   3:32 PM, 04/22/23 Georges Lynch PT DPT  Physical Therapist with [facility]  [phone]

## 2023-05-18 ENCOUNTER — Ambulatory Visit (HOSPITAL_COMMUNITY): Payer: HMO | Attending: Cardiology | Admitting: Physical Therapy

## 2023-05-18 DIAGNOSIS — R42 Dizziness and giddiness: Secondary | ICD-10-CM | POA: Diagnosis not present

## 2023-05-18 NOTE — Therapy (Signed)
OUTPATIENT PHYSICAL THERAPY VESTIBULAR EVALUATION     Patient Name: Max Davenport MRN: [id number] DOB:09-24-1946, 77 y.o., male Today's Date: 05/18/2023  END OF SESSION:  PT End of Session - 05/18/23 1105    Visit Number 2    Number of Visits 3    Date for PT Re-Evaluation 05/20/23    Authorization Type [employer]    PT Start Time 1040    PT Stop Time 1105    PT Time Calculation (min) 35 min    Activity Tolerance Patient tolerated treatment well    Behavior During Therapy WFL for tasks assessed/performed             Past Medical History:  Diagnosis Date   Atrial fibrillation (HCC)    Essential hypertension    Hyperlipidemia    Skin cancer, basal cell    Type 2 diabetes mellitus (HCC)    Past Surgical History:  Procedure Laterality Date   BASAL CELL CARCINOMA EXCISION     Top of head   INGUINAL HERNIA REPAIR Right    LUMBAR FUSION  2008   NASAL RECONSTRUCTION     WISDOM TOOTH EXTRACTION     Patient Active Problem List   Diagnosis Date Noted   Persistent atrial fibrillation (HCC) 01/15/2021    PCP: Nita Sells MD REFERRING PROVIDER: Jonelle Sidle, MD  REFERRING DIAG: balance,vertigo issues  THERAPY DIAG:  Dizziness and giddiness  ONSET DATE: 2 months ago   Rationale for Evaluation and Treatment: Rehabilitation  SUBJECTIVE:   SUBJECTIVE STATEMENT: PT states that he has been doing the exercises and is still dizzy.  Pt feels that he has a sinus infection as he has a lot of pressure as well and is going to go see his MD>   PERTINENT HISTORY: Hx of CX, HTN, A-fib, DM   PAIN:  Are you having pain? No  PRECAUTIONS: Fall  WEIGHT BEARING RESTRICTIONS: No  FALLS: Has patient fallen in last 6 months? No  LIVING ENVIRONMENT: Lives with: lives alone Lives in: House/apartment Stairs: Yes: External: 4 steps; on left going up Has following equipment at home: Single point cane and Grab bars  PLOF: Independent with basic ADLs  PATIENT  GOALS: Get rid of dizziness, improve balance   OBJECTIVE:   DIAGNOSTIC FINDINGS: NA  COGNITION: Overall cognitive status: Within functional limits for tasks assessed   SENSATION: WFL  Cervical ROM:    Active A/PROM (deg) eval  Flexion WNL  Extension WNL  Right lateral flexion   Left lateral flexion   Right rotation WNL  Left rotation WNL  (Blank rows = not tested)  STRENGTH: 4+-5/5 throughout   BED MOBILITY:  Sit to supine Complete Independence Supine to sit Complete Independence Rolling to Right Complete Independence Rolling to Left Complete Independence   TRANSFERS: Assistive device utilized: None  Sit to stand: Complete Independence Stand to sit: Complete Independence Chair to chair: Complete Independence   VESTIBULAR ASSESSMENT:    SYMPTOM BEHAVIOR:  Subjective history: See above  Non-Vestibular symptoms: changes in vision and nausea/vomiting  Type of dizziness: "Funny feeling in the head"  Frequency: daily  Duration: unclear   Aggravating factors:  standing, walking   Relieving factors:  sitting still  Progression of symptoms: unchanged  OCULOMOTOR EXAM:  Ocular Alignment: normal  Ocular ROM: No Limitations  Spontaneous Nystagmus: absent  Smooth Pursuits: intact  VESTIBULAR - OCULAR REFLEX:   Slow VOR: Normal  MOTION SENSITIVITY:  Motion Sensitivity Quotient Intensity: 0 = none, 1 =  Lightheaded, 2 = Mild, 3 = Moderate, 4 = Severe, 5 = Vomiting  Intensity 05/18/23  1. Sitting to supine 0   2. Supine to L side 0   3. Supine to R side 0   4. Supine to sitting 0   5. L Hallpike-Dix  0  6. Up from L   0  7. R Hallpike-Dix  0  8. Up from R   0  9. Sitting, head tipped to L knee  1  10. Head up from L knee  1  11. Sitting, head tipped to R knee  1  12. Head up from R knee  1  13. Sitting head turns x5 0   14.Sitting head nods x5 0   15. In stance, 180 turn to L     16. In stance, 180 turn to R     BALANCE: Tandem: >30 seconds  bilat SLS: 10 sec RT; 8 sec LT    VESTIBULAR TREATMENT:                                                                                                   DATE:  05/18/23  Dix-halpike maneuver completed (-) both RT and LT Side lying test (-) both Rt and LT Quick sititng head turn and nods (-) Standing  Head turns (-) Head nods (-) Tested nose to knee sitting with minimal change Standing fast head turns x 10  Tandem gt with multiple errors Attempted eyes closed Narrow base of support but this was difficult for pt performance improved with sem-tandem stance.  04/22/23 Eval Seated head nods x 5 Seated head turns x 5 Tandem stance x 30" each  SLS x10" each  NBOS with head turns x 5 NBOS with head nods  x 5   PATIENT EDUCATION: Education details: on eval findings, POC and HEP  Person educated: Patient Education method: Explanation Education comprehension: verbalized understanding  HOME EXERCISE PROGRAM: Access Code: [email or web address] URL: [email or web address] Date: 04/22/2023 Prepared by: Georges Lynch  Exercises - Heel Raises with Counter Support  - 1-2 x daily - 7 x weekly - 2 sets - 10 reps - Sit to Stand Without Arm Support  - 1-2 x daily - 7 x weekly - 2 sets - 10 reps - Standing Tandem Balance with Counter Support  - 1-2 x daily - 7 x weekly - 1 sets - 3 reps - 30 second hold - Single Leg Stance with Support  - 1-2 x daily - 7 x weekly - 1 sets - 4 reps - 10 second hold - Standing Hip Abduction  - 1-2 x daily - 7 x weekly - 2 sets - 10 reps - Standing Hip Extension  - 1-2 x daily - 7 x weekly - 2 sets - 10 reps - Romberg Stance with Head Nods  - 1-2 x daily - 7 x weekly - 2 sets - 10 reps - Romberg Stance with Head Rotation  - 1-2 x daily - 7 x weekly - 2 sets - 10 reps  05/18/23: - Walking Tandem Stance  - 1 x daily -  7 x weekly - 1 sets - 2 reps - Half Tandem Stance Balance with Eyes Closed  - 1 x daily - 7 x weekly - 1 sets - 5 reps  GOALS: SHORT TERM  GOALS: Target date: 05/06/2023  Patient will be independent with initial HEP and self-management strategies to improve functional outcomes Baseline:  Goal status: INITIAL   LONG TERM GOALS: Target date: 05/20/2023  Patient will be independent with advanced HEP and self-management strategies to improve functional outcomes Baseline:  Goal status: INITIAL  Patient will be able to hold SLS at least 12 seconds bilateral to demo improved balance and reduced risk for future falls.  Baseline: Tandem: >30 seconds bilat SLS: 10 sec RT; 8 sec LT  Goal status: INITIAL   ASSESSMENT:  CLINICAL IMPRESSION: Pt with minimal dizziness with provocation tests, no nystagmus noted during testing for BPPV.  Therapist updated HEP.  PT feels sx are sinus related and will go back to MD.  PT is going to return in 2 weeks after hopeful antibiotic for a final recheck and states that he feels that he will want to be discharged at this time.   OBJECTIVE IMPAIRMENTS: Abnormal gait, decreased balance, and dizziness.   ACTIVITY LIMITATIONS: lifting, standing, squatting, stairs, transfers, and locomotion level  PARTICIPATION LIMITATIONS: cleaning, laundry, shopping, community activity, and yard work  PERSONAL FACTORS: Age and 3+ comorbidities: See above  are also affecting patient's functional outcome.   REHAB POTENTIAL: Good  CLINICAL DECISION MAKING: Evolving/moderate complexity  EVALUATION COMPLEXITY: Moderate   PLAN:  PT FREQUENCY: 1x/week  PT DURATION: 3 weeks  PLANNED INTERVENTIONS: Therapeutic exercises, Therapeutic activity, Neuromuscular re-education, Balance training, Gait training, Patient/Family education, Self Care, Joint mobilization, Vestibular training, and Canalith repositioning Therapeutic exercises, Therapeutic activity, Neuromuscular re-education, Balance training, Gait training, Patient/Family education, Joint manipulation, Joint mobilization, Stair training, Aquatic Therapy, Dry Needling,  Electrical stimulation, Spinal manipulation, Spinal mobilization, Cryotherapy, Moist heat, scar mobilization, Taping, Traction, Ultrasound, Biofeedback, Ionotophoresis 4mg /ml Dexamethasone, and Manual therapy.   PLAN FOR NEXT SESSION: Progress functional strength, balance and habituation as needed. HEP development.  Virgina Organ, PT CLT [phone]  11:21

## 2023-05-19 ENCOUNTER — Ambulatory Visit: Payer: Medicare Other | Admitting: Cardiology

## 2023-05-22 DIAGNOSIS — I129 Hypertensive chronic kidney disease with stage 1 through stage 4 chronic kidney disease, or unspecified chronic kidney disease: Secondary | ICD-10-CM | POA: Diagnosis not present

## 2023-05-22 DIAGNOSIS — R5383 Other fatigue: Secondary | ICD-10-CM | POA: Diagnosis not present

## 2023-05-22 DIAGNOSIS — R42 Dizziness and giddiness: Secondary | ICD-10-CM | POA: Diagnosis not present

## 2023-05-22 DIAGNOSIS — J329 Chronic sinusitis, unspecified: Secondary | ICD-10-CM | POA: Insufficient documentation

## 2023-06-01 ENCOUNTER — Ambulatory Visit (HOSPITAL_COMMUNITY): Payer: HMO

## 2023-06-01 DIAGNOSIS — R42 Dizziness and giddiness: Secondary | ICD-10-CM

## 2023-06-01 NOTE — Therapy (Signed)
OUTPATIENT PHYSICAL THERAPY VESTIBULAR DISCHARGE  PHYSICAL THERAPY DISCHARGE SUMMARY  Visits from Start of Care: 3  Current functional level related to goals / functional outcomes: See below   Remaining deficits: See below   Education / Equipment: See below   Patient agrees to discharge. Patient goals were met. Patient is being discharged due to meeting the stated rehab goals.      Patient Name: Max Davenport MRN: [id number] DOB:May 09, 1946, 77 y.o., male Today's Date: 06/01/2023  END OF SESSION:   PT End of Session - 06/01/23 1100     Visit Number 3    Number of Visits 3    Date for PT Re-Evaluation 05/20/23    Authorization Type [employer]    PT Start Time 1100    PT Stop Time 1130    PT Time Calculation (min) 30 min    Activity Tolerance Patient tolerated treatment well    Behavior During Therapy WFL for tasks assessed/performed                  Past Medical History:  Diagnosis Date   Atrial fibrillation (HCC)    Essential hypertension    Hyperlipidemia    Skin cancer, basal cell    Type 2 diabetes mellitus (HCC)    Past Surgical History:  Procedure Laterality Date   BASAL CELL CARCINOMA EXCISION     Top of head   INGUINAL HERNIA REPAIR Right    LUMBAR FUSION  2008   NASAL RECONSTRUCTION     WISDOM TOOTH EXTRACTION     Patient Active Problem List   Diagnosis Date Noted   Persistent atrial fibrillation (HCC) 01/15/2021    PCP: Nita Sells MD REFERRING PROVIDER: Jonelle Sidle, MD  REFERRING DIAG: balance,vertigo issues  THERAPY DIAG:  Dizziness and giddiness  ONSET DATE: 2 months ago   Rationale for Evaluation and Treatment: Rehabilitation  SUBJECTIVE:   SUBJECTIVE STATEMENT: Patient reports he went to the MD and got a nasal spray, allergy pill; just got them Sat.  Still feeling about the same.  Still thinks he has a sinus infection.    PERTINENT HISTORY: Hx of CX, HTN, A-fib, DM   PAIN:  Are you having pain?  No  PRECAUTIONS: Fall  WEIGHT BEARING RESTRICTIONS: No  FALLS: Has patient fallen in last 6 months? No  LIVING ENVIRONMENT: Lives with: lives alone Lives in: House/apartment Stairs: Yes: External: 4 steps; on left going up Has following equipment at home: Single point cane and Grab bars  PLOF: Independent with basic ADLs  PATIENT GOALS: Get rid of dizziness, improve balance   OBJECTIVE:   DIAGNOSTIC FINDINGS: NA  COGNITION: Overall cognitive status: Within functional limits for tasks assessed   SENSATION: WFL  Cervical ROM:    Active A/PROM (deg) eval  Flexion WNL  Extension WNL  Right lateral flexion   Left lateral flexion   Right rotation WNL  Left rotation WNL  (Blank rows = not tested)  STRENGTH: 4+-5/5 throughout   BED MOBILITY:  Sit to supine Complete Independence Supine to sit Complete Independence Rolling to Right Complete Independence Rolling to Left Complete Independence   TRANSFERS: Assistive device utilized: None  Sit to stand: Complete Independence Stand to sit: Complete Independence Chair to chair: Complete Independence   VESTIBULAR ASSESSMENT:    SYMPTOM BEHAVIOR:  Subjective history: See above  Non-Vestibular symptoms: changes in vision and nausea/vomiting  Type of dizziness: "Funny feeling in the head"  Frequency: daily  Duration: unclear  Aggravating factors:  standing, walking   Relieving factors:  sitting still  Progression of symptoms: unchanged  OCULOMOTOR EXAM:  Ocular Alignment: normal  Ocular ROM: No Limitations  Spontaneous Nystagmus: absent  Smooth Pursuits: intact  VESTIBULAR - OCULAR REFLEX:   Slow VOR: Normal  MOTION SENSITIVITY:  Motion Sensitivity Quotient Intensity: 0 = none, 1 = Lightheaded, 2 = Mild, 3 = Moderate, 4 = Severe, 5 = Vomiting  Intensity 05/18/23  1. Sitting to supine 0   2. Supine to L side 0   3. Supine to R side 0   4. Supine to sitting 0   5. L Hallpike-Dix  0  6. Up from L   0  7.  R Hallpike-Dix  0  8. Up from R   0  9. Sitting, head tipped to L knee  1  10. Head up from L knee  1  11. Sitting, head tipped to R knee  1  12. Head up from R knee  1  13. Sitting head turns x5 0   14.Sitting head nods x5 0   15. In stance, 180 turn to L     16. In stance, 180 turn to R     BALANCE: Tandem: >30 seconds bilat SLS: 10 sec RT; 8 sec LT    VESTIBULAR TREATMENT:                                                                                                   DATE:  06/01/23 Progress note SLS 12" each leg Review of HEP  05/18/23  Dix-halpike maneuver completed (-) both RT and LT Side lying test (-) both Rt and LT Quick sititng head turn and nods (-) Standing  Head turns (-) Head nods (-) Tested nose to knee sitting with minimal change Standing fast head turns x 10  Tandem gt with multiple errors Attempted eyes closed Narrow base of support but this was difficult for pt performance improved with sem-tandem stance.  04/22/23 Eval Seated head nods x 5 Seated head turns x 5 Tandem stance x 30" each  SLS x10" each  NBOS with head turns x 5 NBOS with head nods  x 5   PATIENT EDUCATION: Education details: on eval findings, POC and HEP  Person educated: Patient Education method: Explanation Education comprehension: verbalized understanding  HOME EXERCISE PROGRAM: Access Code: [email or web address] URL: [email or web address] Date: 04/22/2023 Prepared by: Georges Nollan Muldrow  Exercises - Heel Raises with Counter Support  - 1-2 x daily - 7 x weekly - 2 sets - 10 reps - Sit to Stand Without Arm Support  - 1-2 x daily - 7 x weekly - 2 sets - 10 reps - Standing Tandem Balance with Counter Support  - 1-2 x daily - 7 x weekly - 1 sets - 3 reps - 30 second hold - Single Leg Stance with Support  - 1-2 x daily - 7 x weekly - 1 sets - 4 reps - 10 second hold - Standing Hip Abduction  - 1-2 x daily - 7 x weekly - 2 sets - 10 reps -  Standing Hip Extension  - 1-2 x  daily - 7 x weekly - 2 sets - 10 reps - Romberg Stance with Head Nods  - 1-2 x daily - 7 x weekly - 2 sets - 10 reps - Romberg Stance with Head Rotation  - 1-2 x daily - 7 x weekly - 2 sets - 10 reps  05/18/23: - Walking Tandem Stance  - 1 x daily - 7 x weekly - 1 sets - 2 reps - Half Tandem Stance Balance with Eyes Closed  - 1 x daily - 7 x weekly - 1 sets - 5 reps  GOALS: SHORT TERM GOALS: Target date: 05/06/2023  Patient will be independent with initial HEP and self-management strategies to improve functional outcomes Baseline:  Goal status: MET   LONG TERM GOALS: Target date: 05/20/2023  Patient will be independent with advanced HEP and self-management strategies to improve functional outcomes Baseline:  Goal status: MET  Patient will be able to hold SLS at least 12 seconds bilateral to demo improved balance and reduced risk for future falls.  Baseline: Tandem: >30 seconds bilat SLS: 10 sec RT; 8 sec LT ; 06/01/23 12" each leg Goal status: MET   ASSESSMENT:  CLINICAL IMPRESSION: Patient states he is independent with his HEP.  Likes working on his balance but is still dizzy; thinks is related to sinus issues. He has met all set rehab goals.    OBJECTIVE IMPAIRMENTS: Abnormal gait, decreased balance, and dizziness.   ACTIVITY LIMITATIONS: lifting, standing, squatting, stairs, transfers, and locomotion level  PARTICIPATION LIMITATIONS: cleaning, laundry, shopping, community activity, and yard work  PERSONAL FACTORS: Age and 3+ comorbidities: See above  are also affecting patient's functional outcome.   REHAB POTENTIAL: Good  CLINICAL DECISION MAKING: Evolving/moderate complexity  EVALUATION COMPLEXITY: Moderate   PLAN:  PT FREQUENCY: 1x/week  PT DURATION: 3 weeks  PLANNED INTERVENTIONS: Therapeutic exercises, Therapeutic activity, Neuromuscular re-education, Balance training, Gait training, Patient/Family education, Self Care, Joint mobilization, Vestibular training,  and Canalith repositioning Therapeutic exercises, Therapeutic activity, Neuromuscular re-education, Balance training, Gait training, Patient/Family education, Joint manipulation, Joint mobilization, Stair training, Aquatic Therapy, Dry Needling, Electrical stimulation, Spinal manipulation, Spinal mobilization, Cryotherapy, Moist heat, scar mobilization, Taping, Traction, Ultrasound, Biofeedback, Ionotophoresis 4mg /ml Dexamethasone, and Manual therapy.   PLAN FOR NEXT SESSION: discharge   11:31 AM, 06/01/23 Tyannah Sane Small Carlyn Mullenbach MPT [facility] [place] [phone]

## 2023-06-02 DIAGNOSIS — E669 Obesity, unspecified: Secondary | ICD-10-CM | POA: Diagnosis not present

## 2023-06-02 DIAGNOSIS — R030 Elevated blood-pressure reading, without diagnosis of hypertension: Secondary | ICD-10-CM | POA: Diagnosis not present

## 2023-06-02 DIAGNOSIS — Z6831 Body mass index (BMI) 31.0-31.9, adult: Secondary | ICD-10-CM | POA: Diagnosis not present

## 2023-06-02 DIAGNOSIS — J019 Acute sinusitis, unspecified: Secondary | ICD-10-CM | POA: Diagnosis not present

## 2023-06-18 ENCOUNTER — Encounter (HOSPITAL_COMMUNITY): Payer: HMO

## 2023-06-21 DIAGNOSIS — I129 Hypertensive chronic kidney disease with stage 1 through stage 4 chronic kidney disease, or unspecified chronic kidney disease: Secondary | ICD-10-CM | POA: Diagnosis not present

## 2023-06-21 DIAGNOSIS — E1169 Type 2 diabetes mellitus with other specified complication: Secondary | ICD-10-CM | POA: Diagnosis not present

## 2023-06-21 DIAGNOSIS — N400 Enlarged prostate without lower urinary tract symptoms: Secondary | ICD-10-CM | POA: Diagnosis not present

## 2023-06-24 ENCOUNTER — Encounter: Payer: Self-pay | Admitting: Internal Medicine

## 2023-06-24 DIAGNOSIS — D751 Secondary polycythemia: Secondary | ICD-10-CM | POA: Diagnosis not present

## 2023-06-24 DIAGNOSIS — I129 Hypertensive chronic kidney disease with stage 1 through stage 4 chronic kidney disease, or unspecified chronic kidney disease: Secondary | ICD-10-CM | POA: Diagnosis not present

## 2023-06-24 DIAGNOSIS — E669 Obesity, unspecified: Secondary | ICD-10-CM | POA: Diagnosis not present

## 2023-06-24 DIAGNOSIS — E1169 Type 2 diabetes mellitus with other specified complication: Secondary | ICD-10-CM | POA: Diagnosis not present

## 2023-06-24 DIAGNOSIS — E782 Mixed hyperlipidemia: Secondary | ICD-10-CM | POA: Diagnosis not present

## 2023-06-24 DIAGNOSIS — G4700 Insomnia, unspecified: Secondary | ICD-10-CM | POA: Diagnosis not present

## 2023-06-24 DIAGNOSIS — N189 Chronic kidney disease, unspecified: Secondary | ICD-10-CM | POA: Diagnosis not present

## 2023-06-24 DIAGNOSIS — E1165 Type 2 diabetes mellitus with hyperglycemia: Secondary | ICD-10-CM | POA: Diagnosis not present

## 2023-06-24 DIAGNOSIS — R809 Proteinuria, unspecified: Secondary | ICD-10-CM | POA: Diagnosis not present

## 2023-06-24 DIAGNOSIS — I4891 Unspecified atrial fibrillation: Secondary | ICD-10-CM | POA: Diagnosis not present

## 2023-06-24 DIAGNOSIS — E1122 Type 2 diabetes mellitus with diabetic chronic kidney disease: Secondary | ICD-10-CM | POA: Diagnosis not present

## 2023-06-24 DIAGNOSIS — Z0001 Encounter for general adult medical examination with abnormal findings: Secondary | ICD-10-CM | POA: Diagnosis not present

## 2023-07-05 ENCOUNTER — Emergency Department (HOSPITAL_COMMUNITY): Payer: HMO

## 2023-07-05 ENCOUNTER — Emergency Department (HOSPITAL_COMMUNITY): Admission: EM | Admit: 2023-07-05 | Payer: HMO | Source: Home / Self Care

## 2023-07-05 ENCOUNTER — Other Ambulatory Visit: Payer: Self-pay

## 2023-07-05 ENCOUNTER — Encounter (HOSPITAL_COMMUNITY): Payer: Self-pay | Admitting: Emergency Medicine

## 2023-07-05 DIAGNOSIS — I10 Essential (primary) hypertension: Secondary | ICD-10-CM | POA: Insufficient documentation

## 2023-07-05 DIAGNOSIS — Z85828 Personal history of other malignant neoplasm of skin: Secondary | ICD-10-CM | POA: Diagnosis not present

## 2023-07-05 DIAGNOSIS — M19011 Primary osteoarthritis, right shoulder: Secondary | ICD-10-CM | POA: Diagnosis not present

## 2023-07-05 DIAGNOSIS — E119 Type 2 diabetes mellitus without complications: Secondary | ICD-10-CM | POA: Diagnosis not present

## 2023-07-05 DIAGNOSIS — M25511 Pain in right shoulder: Secondary | ICD-10-CM | POA: Diagnosis not present

## 2023-07-05 DIAGNOSIS — S4981XA Other specified injuries of right shoulder and upper arm, initial encounter: Secondary | ICD-10-CM | POA: Diagnosis not present

## 2023-07-05 LAB — CBC
HCT: 46.1 % (ref 39.0–52.0)
Hemoglobin: 16.7 g/dL (ref 13.0–17.0)
MCH: 33.6 pg (ref 26.0–34.0)
MCHC: 36.2 g/dL — ABNORMAL HIGH (ref 30.0–36.0)
MCV: 92.8 fL (ref 80.0–100.0)
Platelets: 171 10*3/uL (ref 150–400)
RBC: 4.97 MIL/uL (ref 4.22–5.81)
RDW: 11.6 % (ref 11.5–15.5)
WBC: 12.9 10*3/uL — ABNORMAL HIGH (ref 4.0–10.5)
nRBC: 0 % (ref 0.0–0.2)

## 2023-07-05 MED ORDER — PROPOFOL 10 MG/ML IV BOLUS: 1.0000 mg/kg | Freq: Once | INTRAVENOUS | Status: DC

## 2023-07-05 MED ORDER — HYDROMORPHONE HCL 1 MG/ML IJ SOLN
0.5000 mg | Freq: Once | INTRAMUSCULAR | Status: AC
  Administered 2023-07-05: 0.5 mg via INTRAVENOUS
  Filled 2023-07-05: qty 0.5

## 2023-07-05 MED ORDER — ONDANSETRON HCL 4 MG/2ML IJ SOLN
4.0000 mg | Freq: Once | INTRAMUSCULAR | Status: AC
  Administered 2023-07-05: 4 mg via INTRAVENOUS
  Filled 2023-07-05: qty 2

## 2023-07-05 NOTE — ED Notes (Signed)
Notified provider of pts BP

## 2023-07-05 NOTE — ED Triage Notes (Signed)
Pt via POV c/o right shoulder injury sustained while working out a few hours PTA. Pt thinks his shoulder has dislocated; obvious difference noted from right to left with right shoulder several inches lower than left while pt is at rest. Pain rated 8/10 with spasms.

## 2023-07-06 ENCOUNTER — Emergency Department (HOSPITAL_COMMUNITY): Payer: HMO

## 2023-07-06 DIAGNOSIS — M19011 Primary osteoarthritis, right shoulder: Secondary | ICD-10-CM | POA: Diagnosis not present

## 2023-07-06 LAB — COMPREHENSIVE METABOLIC PANEL
ALT: 24 U/L (ref 0–44)
AST: 24 U/L (ref 15–41)
Albumin: 4.1 g/dL (ref 3.5–5.0)
Alkaline Phosphatase: 66 U/L (ref 38–126)
Anion gap: 12 (ref 5–15)
BUN: 28 mg/dL — ABNORMAL HIGH (ref 8–23)
CO2: 21 mmol/L — ABNORMAL LOW (ref 22–32)
Calcium: 9.2 mg/dL (ref 8.9–10.3)
Chloride: 103 mmol/L (ref 98–111)
Creatinine, Ser: 1.62 mg/dL — ABNORMAL HIGH (ref 0.61–1.24)
GFR, Estimated: 44 mL/min — ABNORMAL LOW (ref >60)
Glucose, Bld: 190 mg/dL — ABNORMAL HIGH (ref 70–99)
Potassium: 3.6 mmol/L (ref 3.5–5.1)
Sodium: 136 mmol/L (ref 135–145)
Total Bilirubin: 1.3 mg/dL — ABNORMAL HIGH (ref 0.3–1.2)
Total Protein: 7.4 g/dL (ref 6.5–8.1)

## 2023-07-06 MED ORDER — ACETAMINOPHEN 500 MG PO TABS
1000.0000 mg | ORAL_TABLET | Freq: Once | ORAL | Status: AC
  Administered 2023-07-06: 1000 mg via ORAL
  Filled 2023-07-06: qty 2

## 2023-07-06 MED ORDER — METHOCARBAMOL 500 MG PO TABS
1000.0000 mg | ORAL_TABLET | Freq: Once | ORAL | Status: AC
  Administered 2023-07-06: 1000 mg via ORAL
  Filled 2023-07-06: qty 2

## 2023-07-06 MED ORDER — LIDOCAINE 5 % EX PTCH
1.0000 | MEDICATED_PATCH | CUTANEOUS | Status: DC
  Administered 2023-07-06: 1 via TRANSDERMAL
  Filled 2023-07-06: qty 1

## 2023-07-06 MED ORDER — HYDRALAZINE HCL 25 MG PO TABS
25.0000 mg | ORAL_TABLET | Freq: Once | ORAL | Status: AC
  Administered 2023-07-06: 25 mg via ORAL

## 2023-07-06 MED ORDER — OXYCODONE-ACETAMINOPHEN 5-325 MG PO TABS: 1.0000 | ORAL_TABLET | Freq: Four times a day (QID) | ORAL | 0 refills | Status: DC | PRN

## 2023-07-06 MED ORDER — LIDOCAINE 5 % EX PTCH: 1.0000 | MEDICATED_PATCH | CUTANEOUS | 0 refills | Status: DC

## 2023-07-06 NOTE — ED Provider Notes (Signed)
[facility]-EMERGENCY DEPT [facility] Emergency Department Provider Note MRN:  [id number]  Arrival date & time: 07/06/23     Chief Complaint   Shoulder pain History of Present Illness   Max Davenport is a 77 y.o. year-old male with a history of A-fib, diabetes presenting to the ED with chief complaint of shoulder pain.  Shoulder discomfort for the past several days, this evening became more painful, feels swollen, could not control the pain at home.  Denies fever, no falls or injuries, no other complaints.  Started hurting more after his exercises this evening.  Review of Systems  A thorough review of systems was obtained and all systems are negative except as noted in the HPI and PMH.   Patient's Health History    Past Medical History:  Diagnosis Date   Atrial fibrillation (HCC)    Essential hypertension    Hyperlipidemia    Skin cancer, basal cell    Type 2 diabetes mellitus (HCC)     Past Surgical History:  Procedure Laterality Date   BASAL CELL CARCINOMA EXCISION     Top of head   INGUINAL HERNIA REPAIR Right    LUMBAR FUSION  2008   NASAL RECONSTRUCTION     WISDOM TOOTH EXTRACTION      Family History  Adopted: Yes    Social History   Socioeconomic History   Marital status: Single    Spouse name: Not on file   Number of children: Not on file   Years of education: Not on file   Highest education level: Not on file  Occupational History   Not on file  Tobacco Use   Smoking status: Never   Smokeless tobacco: Never  Vaping Use   Vaping status: Never Used  Substance and Sexual Activity   Alcohol use: Never   Drug use: Never   Sexual activity: Not on file  Other Topics Concern   Not on file  Social History Narrative   Not on file   Social Determinants of Health   Financial Resource Strain: Not on file  Food Insecurity: Not on file  Transportation Needs: Not on file  Physical Activity: Not on file  Stress: Not on file  Social Connections: Not on file   Intimate Partner Violence: Not on file     Physical Exam   Vitals:   07/06/23 0130 07/06/23 0200  BP: (!) 173/111 (!) 167/96  Pulse:    Resp:    Temp:    SpO2:      CONSTITUTIONAL: Well-appearing, NAD NEURO/PSYCH:  Alert and oriented x 3, no focal deficits EYES:  eyes equal and reactive ENT/NECK:  no LAD, no JVD CARDIO: Regular rate, well-perfused, normal S1 and S2 PULM:  CTAB no wheezing or rhonchi GI/GU:  non-distended, non-tender MSK/SPINE:  No gross deformities, anterior swelling noted to the right shoulder, no increased warmth, no erythema, neurovascularly intact, somewhat preserved range of motion SKIN:  no rash, atraumatic   *Additional and/or pertinent findings included in MDM below  Diagnostic and Interventional Summary    EKG Interpretation Date/Time:  Monday July 05 2023 22:55:49 EDT Ventricular Rate:  98 PR Interval:    QRS Duration:  98 QT Interval:  337 QTC Calculation: 431 R Axis:   96  Text Interpretation: Atrial fibrillation Right axis deviation Low voltage, precordial leads Borderline repolarization abnormality Confirmed by Kennis Carina [phone]) on 07/05/2023 11:27:26 PM       Labs Reviewed  CBC - Abnormal; Notable for the following components:  Result Value   WBC 12.9 (*)    MCHC 36.2 (*)    All other components within normal limits  COMPREHENSIVE METABOLIC PANEL - Abnormal; Notable for the following components:   CO2 21 (*)    Glucose, Bld 190 (*)    BUN 28 (*)    Creatinine, Ser 1.62 (*)    Total Bilirubin 1.3 (*)    GFR, Estimated 44 (*)    All other components within normal limits    CT Shoulder Right Wo Contrast  Final Result    DG Shoulder Right  Final Result      Medications  HYDROmorphone (DILAUDID) injection 0.5 mg (0.5 mg Intravenous Given 07/05/23 2306)  ondansetron (ZOFRAN) injection 4 mg (4 mg Intravenous Given 07/05/23 2305)  HYDROmorphone (DILAUDID) injection 0.5 mg (0.5 mg Intravenous Given 07/05/23 2357)   hydrALAZINE (APRESOLINE) tablet 25 mg (25 mg Oral Given 07/06/23 0119)  methocarbamol (ROBAXIN) tablet 1,000 mg (1,000 mg Oral Given 07/06/23 0119)  acetaminophen (TYLENOL) tablet 1,000 mg (1,000 mg Oral Given 07/06/23 0120)     Procedures  /  Critical Care Procedures  ED Course and Medical Decision Making  Initial Impression and Ddx Question dislocation versus effusion, x-ray is equivocal, will need CT.  Overall doubt septic joint.  History most suggestive of a reactive effusion from inflammation.  Past medical/surgical history that increases complexity of ED encounter: A-fib  Interpretation of Diagnostics I personally reviewed the shoulder x-ray and my interpretation is as follows: Question effusion versus dislocation  Radiology recommending CT scan which is largely unremarkable, no dislocation, no effusion, no fracture  Patient Reassessment and Ultimate Disposition/Management     Patient's blood pressure improved with hydralazine, pain control, exam continues to be generally benign with no evidence to suggest septic joint and with no effusion there is really no indication for arthrocentesis or further testing here in the emergency department, appropriate for discharge.  Patient management required discussion with the following services or consulting groups:  None  Complexity of Problems Addressed Acute illness or injury that poses threat of life of bodily function  Additional Data Reviewed and Analyzed Further history obtained from: Further history from spouse/family member  Additional Factors Impacting ED Encounter Risk Prescriptions  Elmer Sow. Pilar Plate, MD [facility] Emergency Medicine [facility] [email or web address]  Final Clinical Impressions(s) / ED Diagnoses     ICD-10-CM   1. Acute pain of right shoulder  M25.511       ED Discharge Orders          Ordered    oxyCODONE-acetaminophen (PERCOCET/ROXICET) 5-325 MG tablet  Every 6 hours PRN         07/06/23 0223    lidocaine (LIDODERM) 5 %  Every 24 hours        07/06/23 0223             Discharge Instructions Discussed with and Provided to Patient:     Discharge Instructions      You were evaluated in the Emergency Department and after careful evaluation, we did not find any emergent condition requiring admission or further testing in the hospital.  Your exam/testing today was overall reassuring.  Recommend follow-up with orthopedics regarding his shoulder pain.  Recommend using the numbing patches daily for pain relief.  Can also use the oxycodone tablets as needed.  Recommend cold compresses.  Please return to the Emergency Department if you experience any worsening of your condition.  Thank you for allowing [place] to be a part  of your care.        Sabas Sous, MD 07/06/23 [phone]

## 2023-07-06 NOTE — Discharge Instructions (Signed)
You were evaluated in the Emergency Department and after careful evaluation, we did not find any emergent condition requiring admission or further testing in the hospital.  Your exam/testing today was overall reassuring.  Recommend follow-up with orthopedics regarding his shoulder pain.  Recommend using the numbing patches daily for pain relief.  Can also use the oxycodone tablets as needed.  Recommend cold compresses.  Please return to the Emergency Department if you experience any worsening of your condition.  Thank you for allowing [place] to be a part of your care.

## 2023-07-07 ENCOUNTER — Ambulatory Visit: Payer: HMO | Admitting: Surgical

## 2023-07-07 ENCOUNTER — Other Ambulatory Visit: Payer: Self-pay

## 2023-07-07 DIAGNOSIS — M19019 Primary osteoarthritis, unspecified shoulder: Secondary | ICD-10-CM

## 2023-07-07 MED ORDER — OXYCODONE-ACETAMINOPHEN 5-325 MG PO TABS: 1.0000 | ORAL_TABLET | Freq: Four times a day (QID) | ORAL | 0 refills | Status: DC | PRN

## 2023-07-07 MED ORDER — DIAZEPAM 5 MG PO TABS: ORAL_TABLET | ORAL | 0 refills | Status: DC

## 2023-07-07 MED ORDER — METHOCARBAMOL 500 MG PO TABS: 500.0000 mg | ORAL_TABLET | Freq: Three times a day (TID) | ORAL | 1 refills | Status: DC | PRN

## 2023-07-07 MED FILL — Oxycodone w/ Acetaminophen Tab 5-325 MG: ORAL | Qty: 6 | Status: AC

## 2023-07-09 ENCOUNTER — Encounter: Payer: Self-pay | Admitting: Surgical

## 2023-07-09 NOTE — Progress Notes (Signed)
Office Visit Note   Patient: Max Davenport           Date of Birth: 06-05-1946           MRN: [id number] Visit Date: 07/07/2023 Requested by: Benita Stabile, MD [address] [place],  [place] [address] PCP: Benita Stabile, MD  Subjective: Chief Complaint  Patient presents with   Right Shoulder - Pain    HPI: Max Davenport is a 77 y.o. male who presents to the office reporting shoulder pain.  Patient is accompanied by his sister today.  He states that he has a history of intermittent shoulder pain that seems to flareup in intensity based on when he works out.  He has a [employer] and free weights at home and states that on Saturday and Monday he upped his weights and did an extensive workout that primarily focused on doing bicep curls as well as some lighter weights with overhead dumbbell press.  He did a workout both Monday morning and Monday afternoon.  Typically when he has his workouts, he will have some increased pain in the anterior aspect of the shoulder but Monday evening, he noticed a lot of increased swelling in the anterior aspect of the shoulder and severe intractable pain that prompted him to go to the emergency room.  He was evaluated for septic arthritis with radiographs and CT scan demonstrating no obvious evidence of joint effusion or acute osseous abnormality.  He was discharged with follow-up here.  He describes global pain throughout the entirety of the shoulder but the worst pain is in the anterior aspect of the shoulder.  He denies any fevers or chills.  No history of prior surgery.  He denies any numbness or tingling or any radiation of pain past the elbow.  Still complains of a "knot" in the anterior aspect of the shoulder that is smaller in size compared with when he came up on Monday.  Has a lot of difficulty sleeping.  Has history of atrial fibrillation for which he takes Xarelto.  He is prediabetic.  He has been taking Percocet for pain control which has somewhat eased  his pain..                ROS: All systems reviewed are negative as they relate to the chief complaint within the history of present illness.  Patient denies fevers or chills.  Assessment & Plan: Visit Diagnoses:  1. Shoulder arthritis     Plan: Patient is a 77 year old male who presents for evaluation of right shoulder pain.  Has history of onset of shoulder pain after extensive workout consisting of bicep curls and overhead deltoid lifts.  Typically has increase in shoulder pain after workouts but not to this extent.  Had workup in the emergency department demonstrating no acute osseous abnormality.  Reviewed the CT scan which does demonstrate some findings of osteoarthritis.  He has ultrasound examination in the office today that is somewhat limited due to patient's body habitus.  Appears his bicep tendon is medially dislocated from the bicipital groove which would correlate with his subscapularis weakness on exam.  He also has likely extensive full-thickness tearing of posterior superior rotator cuff with lack of significant tendinous attachment noted on the posterior ultrasound view and with ER weakness demonstrated on exam.  We discussed options available to patient.  After lengthy discussion, think it would be best to obtain MRI of the shoulder for  best evaluation of the extent  of his rotator cuff damage and osteoarthritis based on the intractable pain he has been having in the last several days.  We administered Toradol/bupivacaine injection into the glenohumeral joint under ultrasound to give him some temporary relief hopefully.  Follow-up after MRI to review results.  Need to reevaluate his deltoid function once pain calms down as lateral deltoid does not fire as consistently as the posterior deltoid which may just be pain-related.  Follow-Up Instructions: No follow-ups on file.   Orders:  Orders Placed This Encounter  Procedures   US Guided Needle Placement - No Linked Charges   MR  SHOULDER RIGHT WO CONTRAST   Meds ordered this encounter  Medications   oxyCODONE-acetaminophen (PERCOCET/ROXICET) 5-325 MG tablet    Sig: Take 1 tablet by mouth every 6 (six) hours as needed for severe pain.    Dispense:  10 tablet    Refill:  0   diazepam (VALIUM) 5 MG tablet    Sig: Take 1 tablet 30 to 60 minutes prior to MRI scan.  Do not operate motor vehicle while taking this medication.    Dispense:  2 tablet    Refill:  0   methocarbamol (ROBAXIN) 500 MG tablet    Sig: Take 1 tablet (500 mg total) by mouth every 8 (eight) hours as needed for muscle spasms.    Dispense:  30 tablet    Refill:  1      Procedures: No procedures performed   Clinical Data: No additional findings.  Objective: Vital Signs: There were no vitals taken for this visit.  Physical Exam:  Constitutional: Patient appears well-developed HEENT:  Head: Normocephalic Eyes:EOM are normal Neck: Normal range of motion Cardiovascular: Normal rate Pulmonary/chest: Effort normal Neurologic: Patient is alert Skin: Skin is warm Psychiatric: Patient has normal mood and affect  Ortho Exam: Ortho exam demonstrates left shoulder with 80 degrees X rotation, 95 degrees abduction, 165 degrees forward elevation passively and actively.  This is compared with the right shoulder with about 5 degrees X rotation, 60 degrees abduction, 90 to 95 degrees forward elevation passively and actively.  He has significant weakness with external rotation strength testing of the right shoulder relative to the left.  Positive external rotation lag sign.  He has weakness with subscapularis strength testing rated 4/5 relative to 5/5 strength in the left shoulder.  Intact EPL, FPL, finger abduction, pronation/supination, bicep, tricep, deltoid.  His sensation is intact over the lateral deltoid but the lateral head of the deltoid does not seem to fire as consistently as the posterior deltoid.  No tenderness over the [facility] joint.  Moderate  tenderness over the bicipital groove.  No cellulitis or skin changes noted.  Specialty Comments:  No specialty comments available.  Imaging: No results found.   PMFS History: Patient Active Problem List   Diagnosis Date Noted   Persistent atrial fibrillation (HCC) 01/15/2021   Past Medical History:  Diagnosis Date   Atrial fibrillation (HCC)    Essential hypertension    Hyperlipidemia    Skin cancer, basal cell    Type 2 diabetes mellitus (HCC)     Family History  Adopted: Yes    Past Surgical History:  Procedure Laterality Date   BASAL CELL CARCINOMA EXCISION     Top of head   INGUINAL HERNIA REPAIR Right    LUMBAR FUSION  2008   NASAL RECONSTRUCTION     WISDOM TOOTH EXTRACTION     Social History   Occupational History   Not on  file  Tobacco Use   Smoking status: Never   Smokeless tobacco: Never  Vaping Use   Vaping status: Never Used  Substance and Sexual Activity   Alcohol use: Never   Drug use: Never   Sexual activity: Not on file

## 2023-07-10 ENCOUNTER — Other Ambulatory Visit: Payer: HMO

## 2023-07-29 ENCOUNTER — Other Ambulatory Visit: Payer: Self-pay | Admitting: Cardiology

## 2023-08-09 ENCOUNTER — Telehealth: Payer: Self-pay | Admitting: Cardiology

## 2023-08-09 NOTE — Telephone Encounter (Signed)
I spoke with sister and explained to her that patient is not on Eliquis since 2022 and is taking Xarelto 15 mg qd. Marcelline Deist was prescribed by pcp and she will f/u with Dr.Hall's office.

## 2023-08-09 NOTE — Telephone Encounter (Signed)
Pt c/o medication issue:  1. Name of Medication:   Eliquis and Farxiga  2. How are you currently taking this medication (dosage and times per day)?   3. Are you having a reaction (difficulty breathing--STAT)?   4. What is your medication issue?   Sister Clista Bernhardt) stated patient has been taking these medications and patient is confused about what he should be taking.

## 2023-09-09 ENCOUNTER — Encounter: Payer: Self-pay | Admitting: Cardiology

## 2023-09-09 ENCOUNTER — Telehealth: Payer: Self-pay | Admitting: Cardiology

## 2023-09-09 ENCOUNTER — Encounter: Payer: Self-pay | Admitting: *Deleted

## 2023-09-09 ENCOUNTER — Ambulatory Visit: Payer: HMO | Attending: Cardiology | Admitting: Cardiology

## 2023-09-09 VITALS — BP 138/72 | HR 88 | Ht 69.0 in | Wt 211.8 lb

## 2023-09-09 DIAGNOSIS — R2681 Unsteadiness on feet: Secondary | ICD-10-CM

## 2023-09-09 DIAGNOSIS — I4821 Permanent atrial fibrillation: Secondary | ICD-10-CM | POA: Diagnosis not present

## 2023-09-09 DIAGNOSIS — R0609 Other forms of dyspnea: Secondary | ICD-10-CM

## 2023-09-09 DIAGNOSIS — E782 Mixed hyperlipidemia: Secondary | ICD-10-CM | POA: Diagnosis not present

## 2023-09-09 DIAGNOSIS — I10 Essential (primary) hypertension: Secondary | ICD-10-CM | POA: Diagnosis not present

## 2023-09-09 NOTE — Telephone Encounter (Signed)
Checking percert on the following patient for testing scheduled at [facility].    LEXISCAN  09-15-2023

## 2023-09-09 NOTE — Patient Instructions (Addendum)

## 2023-09-09 NOTE — Progress Notes (Signed)
Cardiology Office Note  Date: 09/09/2023   ID: Max Davenport, Max Davenport March 10, 1946, MRN [id number]  History of Present Illness: Max Davenport is a 77 y.o. male last seen in April.  He is here today with family member for a follow-up visit.  We discussed his symptoms.  He tells me that he did undergo PT for evaluation of recurring dizziness/unsteadiness.  States that they did evaluation for positional vertigo and also gave him some exercises.  He does not report any substantial improvement in symptoms however.  Unrelated to his dizziness/unsteadiness, he also mentions that he has been more fatigued with typical exertion, walking to a family member's home nearby or outside seems to be more of a challenge.  He does not describe any chest tightness with this.  No specific sense of palpitations either.  He reports chronic trouble with his sinuses, has had some visual blurriness without precipitant.  No headaches however.  I reviewed his medications today.  He cannot identify any specific medication that seems to precipitate symptoms.  He did ask about Cozaar and Toprol-XL given potential listed side effects on the bottle.  Typically, lightheadedness or dizziness would be more associated with frank hypotension on either of these drugs or symptomatic bradycardia.  He has not had any clear evidence of either of these however.  He does not report any association with [name] initiation.  He is on Flomax which could be a potential contributor.  I reviewed his lab work from July.  He still follows with [name].  Physical Exam: VS:  BP 138/72   Pulse 88   Ht 5\' 9"  (1.753 m)   Wt 211 lb 12.8 oz (96.1 kg)   SpO2 98%   BMI 31.28 kg/m , BMI Body mass index is 31.28 kg/m.  Wt Readings from Last 3 Encounters:  09/09/23 211 lb 12.8 oz (96.1 kg)  07/05/23 218 lb (98.9 kg)  04/08/23 218 lb (98.9 kg)    General: Patient appears comfortable at rest. HEENT: Conjunctiva and lids normal. Neck: Supple, no elevated  JVP or carotid bruits. Lungs: Clear to auscultation, nonlabored breathing at rest. Cardiac: Irregularly irregular, 1/6 systolic murmur, no gallop. Extremities: No pitting edema.  ECG:  An ECG dated 07/05/2023 was personally reviewed today and demonstrated:  Atrial fibrillation with vertical axis, low voltage, nonspecific ST changes.  Labwork: 07/05/2023: ALT 24; AST 24; BUN 28; Creatinine, Ser 1.62; Hemoglobin 16.7; Platelets 171; Potassium 3.6; Sodium 136  July 2024: Cholesterol 227, triglycerides 333, HDL 50, LDL 119  Other Studies Reviewed Today:  No interval cardiac testing for review today.  Assessment and Plan:  1.  Chronic recurring dizziness/unsteadiness.  Difficult to sort out in terms of etiology.  He has not been frankly orthostatic on measurements, no obvious hypotension documented by home blood pressure checks, did not improve with PT and vestibular exercises.  He does report chronic trouble with his sinuses, no headaches, has had some intermittent visual blurriness.  At this point I do not have a strong indication that his symptoms are cardiac.  Heart rate is controlled in atrial fibrillation and he does not report any worsening palpitations.  I think it would be reasonable for him to have imaging of his brain and I have asked him to discuss this with [name].  On review of his medications we also will have him do temporary hold on Flomax and then Farxiga at separate times, to see if this has any impact on his symptoms.  2.  Exertional fatigue, no definite angina.  He has no known history of obstructive CAD although no recent ischemic evaluations.  Plan to obtain a Lexiscan Myoview for further evaluation.  3.  Permanent atrial fibrillation with CHA2DS2-VASc score of 3.  Heart rate is well-controlled on low-dose Toprol-XL.  He continues on Xarelto for stroke prophylaxis, no reported bleeding problems.   4.  Essential hypertension.  Currently on Cozaar 50 mg twice daily with  systolic in the 130s today.  I have asked him to check blood pressure periodically at home, particularly when he has symptoms to see if there is any potential correlation to hypotension.   5.  Mixed hyperlipidemia.  He remains on Lipitor and Zetia.  Disposition:  Follow up  test results.  Signed, Jonelle Sidle, M.D., F.A.C.C.  [facility] at [facility]

## 2023-09-14 ENCOUNTER — Telehealth (HOSPITAL_COMMUNITY): Payer: Self-pay | Admitting: Emergency Medicine

## 2023-09-14 NOTE — Telephone Encounter (Signed)
Reviewed prep for Lexiscan with pt. Patient understands and will arrive 0830 to [facility]

## 2023-09-15 ENCOUNTER — Encounter (HOSPITAL_COMMUNITY): Payer: Self-pay

## 2023-09-15 ENCOUNTER — Encounter (HOSPITAL_BASED_OUTPATIENT_CLINIC_OR_DEPARTMENT_OTHER)
Admission: RE | Admit: 2023-09-15 | Discharge: 2023-09-15 | Disposition: A | Discharge status: Home / Self Care | Payer: HMO | Source: Ambulatory Visit | Attending: Cardiology | Admitting: Cardiology

## 2023-09-15 ENCOUNTER — Ambulatory Visit (HOSPITAL_COMMUNITY)
Admission: RE | Admit: 2023-09-15 | Discharge: 2023-09-15 | Disposition: A | Discharge status: Home / Self Care | Payer: HMO | Source: Ambulatory Visit | Attending: Cardiology | Admitting: Cardiology

## 2023-09-15 DIAGNOSIS — R0609 Other forms of dyspnea: Secondary | ICD-10-CM | POA: Diagnosis not present

## 2023-09-15 LAB — NM MYOCAR MULTI W/SPECT W/WALL MOTION / EF
LV dias vol: 86 mL (ref 62–150)
LV sys vol: 33 mL
Nuc Stress EF: 61 %
Peak HR: 94 {beats}/min
RATE: 0.4
Rest HR: 65 {beats}/min
Rest Nuclear Isotope Dose: 10.4 mCi
SDS: 0
SRS: 0
SSS: 0
ST Depression (mm): 0 mm
Stress Nuclear Isotope Dose: 32 mCi

## 2023-09-15 MED ORDER — SODIUM CHLORIDE FLUSH 0.9 % IV SOLN
INTRAVENOUS | Status: AC
  Administered 2023-09-15: 10 mL via INTRAVENOUS
  Filled 2023-09-15: qty 10

## 2023-09-15 MED ORDER — TECHNETIUM TC 99M TETROFOSMIN IV KIT
30.0000 | PACK | Freq: Once | INTRAVENOUS | Status: AC | PRN
  Administered 2023-09-15: 32 via INTRAVENOUS

## 2023-09-15 MED ORDER — TECHNETIUM TC 99M TETROFOSMIN IV KIT
10.0000 | PACK | Freq: Once | INTRAVENOUS | Status: AC | PRN
  Administered 2023-09-15: 10.4 via INTRAVENOUS

## 2023-09-15 MED ORDER — REGADENOSON 0.4 MG/5ML IV SOLN
INTRAVENOUS | Status: AC
  Administered 2023-09-15: 0.4 mg via INTRAVENOUS
  Filled 2023-09-15: qty 5

## 2023-09-22 DIAGNOSIS — J328 Other chronic sinusitis: Secondary | ICD-10-CM | POA: Diagnosis not present

## 2023-09-22 DIAGNOSIS — J302 Other seasonal allergic rhinitis: Secondary | ICD-10-CM | POA: Diagnosis not present

## 2023-09-22 DIAGNOSIS — R42 Dizziness and giddiness: Secondary | ICD-10-CM | POA: Diagnosis not present

## 2023-09-24 ENCOUNTER — Other Ambulatory Visit: Payer: Self-pay | Admitting: Student

## 2023-09-24 DIAGNOSIS — R42 Dizziness and giddiness: Secondary | ICD-10-CM

## 2023-09-28 DIAGNOSIS — E782 Mixed hyperlipidemia: Secondary | ICD-10-CM | POA: Diagnosis not present

## 2023-09-28 DIAGNOSIS — E1169 Type 2 diabetes mellitus with other specified complication: Secondary | ICD-10-CM | POA: Diagnosis not present

## 2023-10-01 DIAGNOSIS — J301 Allergic rhinitis due to pollen: Secondary | ICD-10-CM | POA: Diagnosis not present

## 2023-10-11 ENCOUNTER — Other Ambulatory Visit: Payer: Self-pay

## 2023-10-11 ENCOUNTER — Encounter (HOSPITAL_COMMUNITY): Payer: Self-pay

## 2023-10-11 ENCOUNTER — Emergency Department (HOSPITAL_COMMUNITY): Payer: HMO

## 2023-10-11 ENCOUNTER — Inpatient Hospital Stay (HOSPITAL_COMMUNITY)
Admission: EM | Admit: 2023-10-11 | Discharge: 2023-10-14 | DRG: 177 | Disposition: A | Discharge status: Home / Self Care | Payer: HMO | Attending: Internal Medicine | Admitting: Internal Medicine

## 2023-10-11 DIAGNOSIS — Z7952 Long term (current) use of systemic steroids: Secondary | ICD-10-CM

## 2023-10-11 DIAGNOSIS — R918 Other nonspecific abnormal finding of lung field: Secondary | ICD-10-CM | POA: Diagnosis not present

## 2023-10-11 DIAGNOSIS — R053 Chronic cough: Secondary | ICD-10-CM | POA: Diagnosis not present

## 2023-10-11 DIAGNOSIS — R590 Localized enlarged lymph nodes: Secondary | ICD-10-CM | POA: Diagnosis not present

## 2023-10-11 DIAGNOSIS — R052 Subacute cough: Secondary | ICD-10-CM

## 2023-10-11 DIAGNOSIS — E1165 Type 2 diabetes mellitus with hyperglycemia: Secondary | ICD-10-CM | POA: Diagnosis present

## 2023-10-11 DIAGNOSIS — Z981 Arthrodesis status: Secondary | ICD-10-CM

## 2023-10-11 DIAGNOSIS — I4891 Unspecified atrial fibrillation: Secondary | ICD-10-CM | POA: Diagnosis present

## 2023-10-11 DIAGNOSIS — U071 COVID-19: Principal | ICD-10-CM | POA: Diagnosis present

## 2023-10-11 DIAGNOSIS — E782 Mixed hyperlipidemia: Secondary | ICD-10-CM | POA: Diagnosis present

## 2023-10-11 DIAGNOSIS — I4819 Other persistent atrial fibrillation: Secondary | ICD-10-CM | POA: Diagnosis present

## 2023-10-11 DIAGNOSIS — I10 Essential (primary) hypertension: Secondary | ICD-10-CM | POA: Diagnosis present

## 2023-10-11 DIAGNOSIS — J189 Pneumonia, unspecified organism: Principal | ICD-10-CM | POA: Diagnosis present

## 2023-10-11 DIAGNOSIS — E1122 Type 2 diabetes mellitus with diabetic chronic kidney disease: Secondary | ICD-10-CM | POA: Diagnosis present

## 2023-10-11 DIAGNOSIS — R634 Abnormal weight loss: Secondary | ICD-10-CM | POA: Diagnosis not present

## 2023-10-11 DIAGNOSIS — N1832 Chronic kidney disease, stage 3b: Secondary | ICD-10-CM | POA: Diagnosis present

## 2023-10-11 DIAGNOSIS — N400 Enlarged prostate without lower urinary tract symptoms: Secondary | ICD-10-CM | POA: Diagnosis present

## 2023-10-11 DIAGNOSIS — Z79899 Other long term (current) drug therapy: Secondary | ICD-10-CM

## 2023-10-11 DIAGNOSIS — Z7901 Long term (current) use of anticoagulants: Secondary | ICD-10-CM

## 2023-10-11 DIAGNOSIS — I129 Hypertensive chronic kidney disease with stage 1 through stage 4 chronic kidney disease, or unspecified chronic kidney disease: Secondary | ICD-10-CM | POA: Diagnosis present

## 2023-10-11 DIAGNOSIS — E785 Hyperlipidemia, unspecified: Secondary | ICD-10-CM | POA: Diagnosis present

## 2023-10-11 DIAGNOSIS — Z85828 Personal history of other malignant neoplasm of skin: Secondary | ICD-10-CM

## 2023-10-11 DIAGNOSIS — J1282 Pneumonia due to coronavirus disease 2019: Secondary | ICD-10-CM | POA: Diagnosis present

## 2023-10-11 DIAGNOSIS — R059 Cough, unspecified: Secondary | ICD-10-CM | POA: Diagnosis not present

## 2023-10-11 DIAGNOSIS — E663 Overweight: Secondary | ICD-10-CM | POA: Diagnosis present

## 2023-10-11 MED ORDER — LORAZEPAM 2 MG/ML IJ SOLN
1.0000 mg | Freq: Once | INTRAMUSCULAR | Status: AC | PRN
  Administered 2023-10-13: 1 mg via INTRAVENOUS
  Filled 2023-10-11: qty 1

## 2023-10-11 MED ORDER — ALBUTEROL SULFATE HFA 108 (90 BASE) MCG/ACT IN AERS
2.0000 | INHALATION_SPRAY | Freq: Once | RESPIRATORY_TRACT | Status: AC
  Administered 2023-10-12: 2 via RESPIRATORY_TRACT
  Filled 2023-10-11: qty 6.7

## 2023-10-11 NOTE — ED Triage Notes (Signed)
C/o intermittent cough x 3 months. Tessalon pearl with relief Seen ENT and diagnosed with sinus infection and prescribed steroids and abx.  finished doxycycline yesterday 16lbs weight loss in 2 weeks.

## 2023-10-12 ENCOUNTER — Emergency Department (HOSPITAL_COMMUNITY): Payer: HMO

## 2023-10-12 DIAGNOSIS — E785 Hyperlipidemia, unspecified: Secondary | ICD-10-CM | POA: Diagnosis present

## 2023-10-12 DIAGNOSIS — I129 Hypertensive chronic kidney disease with stage 1 through stage 4 chronic kidney disease, or unspecified chronic kidney disease: Secondary | ICD-10-CM | POA: Diagnosis not present

## 2023-10-12 DIAGNOSIS — R918 Other nonspecific abnormal finding of lung field: Secondary | ICD-10-CM | POA: Diagnosis not present

## 2023-10-12 DIAGNOSIS — I10 Essential (primary) hypertension: Secondary | ICD-10-CM | POA: Diagnosis present

## 2023-10-12 DIAGNOSIS — Z981 Arthrodesis status: Secondary | ICD-10-CM | POA: Diagnosis not present

## 2023-10-12 DIAGNOSIS — E1165 Type 2 diabetes mellitus with hyperglycemia: Secondary | ICD-10-CM | POA: Diagnosis present

## 2023-10-12 DIAGNOSIS — J189 Pneumonia, unspecified organism: Secondary | ICD-10-CM | POA: Diagnosis present

## 2023-10-12 DIAGNOSIS — Z85828 Personal history of other malignant neoplasm of skin: Secondary | ICD-10-CM | POA: Diagnosis not present

## 2023-10-12 DIAGNOSIS — R590 Localized enlarged lymph nodes: Secondary | ICD-10-CM | POA: Diagnosis not present

## 2023-10-12 DIAGNOSIS — E663 Overweight: Secondary | ICD-10-CM | POA: Diagnosis not present

## 2023-10-12 DIAGNOSIS — U071 COVID-19: Secondary | ICD-10-CM | POA: Diagnosis not present

## 2023-10-12 DIAGNOSIS — Z7901 Long term (current) use of anticoagulants: Secondary | ICD-10-CM | POA: Diagnosis not present

## 2023-10-12 DIAGNOSIS — N400 Enlarged prostate without lower urinary tract symptoms: Secondary | ICD-10-CM | POA: Diagnosis present

## 2023-10-12 DIAGNOSIS — R052 Subacute cough: Secondary | ICD-10-CM | POA: Diagnosis not present

## 2023-10-12 DIAGNOSIS — N1832 Chronic kidney disease, stage 3b: Secondary | ICD-10-CM | POA: Diagnosis not present

## 2023-10-12 DIAGNOSIS — R0602 Shortness of breath: Secondary | ICD-10-CM | POA: Diagnosis not present

## 2023-10-12 DIAGNOSIS — J1282 Pneumonia due to coronavirus disease 2019: Secondary | ICD-10-CM | POA: Diagnosis not present

## 2023-10-12 DIAGNOSIS — R634 Abnormal weight loss: Secondary | ICD-10-CM | POA: Diagnosis not present

## 2023-10-12 DIAGNOSIS — R053 Chronic cough: Secondary | ICD-10-CM | POA: Diagnosis not present

## 2023-10-12 DIAGNOSIS — Z7952 Long term (current) use of systemic steroids: Secondary | ICD-10-CM | POA: Diagnosis not present

## 2023-10-12 DIAGNOSIS — I4819 Other persistent atrial fibrillation: Secondary | ICD-10-CM

## 2023-10-12 DIAGNOSIS — E1122 Type 2 diabetes mellitus with diabetic chronic kidney disease: Secondary | ICD-10-CM | POA: Diagnosis not present

## 2023-10-12 DIAGNOSIS — Z79899 Other long term (current) drug therapy: Secondary | ICD-10-CM | POA: Diagnosis not present

## 2023-10-12 DIAGNOSIS — R059 Cough, unspecified: Secondary | ICD-10-CM | POA: Diagnosis not present

## 2023-10-12 LAB — PHOSPHORUS: Phosphorus: 2.5 mg/dL (ref 2.5–4.6)

## 2023-10-12 LAB — RESPIRATORY PANEL BY PCR

## 2023-10-12 LAB — GLUCOSE, CAPILLARY
Glucose-Capillary: 197 mg/dL — ABNORMAL HIGH (ref 70–99)
Glucose-Capillary: 229 mg/dL — ABNORMAL HIGH (ref 70–99)
Glucose-Capillary: 256 mg/dL — ABNORMAL HIGH (ref 70–99)
Glucose-Capillary: 342 mg/dL — ABNORMAL HIGH (ref 70–99)

## 2023-10-12 LAB — HEMOGLOBIN A1C
Hgb A1c MFr Bld: 8 % — ABNORMAL HIGH (ref 4.8–5.6)
Mean Plasma Glucose: 182.9 mg/dL

## 2023-10-12 LAB — COMPREHENSIVE METABOLIC PANEL
ALT: 25 U/L (ref 0–44)
AST: 19 U/L (ref 15–41)
Albumin: 2.6 g/dL — ABNORMAL LOW (ref 3.5–5.0)
Alkaline Phosphatase: 80 U/L (ref 38–126)
Anion gap: 9 (ref 5–15)
BUN: 22 mg/dL (ref 8–23)
CO2: 26 mmol/L (ref 22–32)
Calcium: 8.6 mg/dL — ABNORMAL LOW (ref 8.9–10.3)
Chloride: 99 mmol/L (ref 98–111)
Creatinine, Ser: 1.65 mg/dL — ABNORMAL HIGH (ref 0.61–1.24)
GFR, Estimated: 43 mL/min — ABNORMAL LOW (ref >60)
Glucose, Bld: 222 mg/dL — ABNORMAL HIGH (ref 70–99)
Potassium: 4.9 mmol/L (ref 3.5–5.1)
Sodium: 134 mmol/L — ABNORMAL LOW (ref 135–145)
Total Bilirubin: 0.9 mg/dL (ref 0.3–1.2)
Total Protein: 6.9 g/dL (ref 6.5–8.1)

## 2023-10-12 LAB — CBC WITH DIFFERENTIAL/PLATELET
Abs Immature Granulocytes: 0.08 10*3/uL — ABNORMAL HIGH (ref 0.00–0.07)
Basophils Absolute: 0 10*3/uL (ref 0.0–0.1)
Basophils Relative: 0 %
Eosinophils Absolute: 0.1 10*3/uL (ref 0.0–0.5)
Eosinophils Relative: 1 %
HCT: 47.2 % (ref 39.0–52.0)
Hemoglobin: 16.6 g/dL (ref 13.0–17.0)
Immature Granulocytes: 1 %
Lymphocytes Relative: 13 %
Lymphs Abs: 1.1 10*3/uL (ref 0.7–4.0)
MCH: 33 pg (ref 26.0–34.0)
MCHC: 35.2 g/dL (ref 30.0–36.0)
MCV: 93.8 fL (ref 80.0–100.0)
Monocytes Absolute: 0.7 10*3/uL (ref 0.1–1.0)
Monocytes Relative: 8 %
Neutro Abs: 6.8 10*3/uL (ref 1.7–7.7)
Neutrophils Relative %: 77 %
Platelets: 174 10*3/uL (ref 150–400)
RBC: 5.03 MIL/uL (ref 4.22–5.81)
RDW: 11.9 % (ref 11.5–15.5)
WBC: 8.7 10*3/uL (ref 4.0–10.5)
nRBC: 0 % (ref 0.0–0.2)

## 2023-10-12 LAB — PROCALCITONIN: Procalcitonin: 0.27 ng/mL

## 2023-10-12 LAB — MAGNESIUM: Magnesium: 1.9 mg/dL (ref 1.7–2.4)

## 2023-10-12 LAB — C-REACTIVE PROTEIN: CRP: 16.7 mg/dL — ABNORMAL HIGH (ref <1.0)

## 2023-10-12 LAB — SEDIMENTATION RATE: Sed Rate: 60 mm/h — ABNORMAL HIGH (ref 0–16)

## 2023-10-12 LAB — SARS CORONAVIRUS 2 BY RT PCR: SARS Coronavirus 2 by RT PCR: POSITIVE — AB

## 2023-10-12 MED ORDER — BENZONATATE 100 MG PO CAPS: 200.0000 mg | ORAL_CAPSULE | Freq: Three times a day (TID) | ORAL | Status: DC | PRN

## 2023-10-12 MED ORDER — OXYCODONE-ACETAMINOPHEN 5-325 MG PO TABS: 1.0000 | ORAL_TABLET | Freq: Four times a day (QID) | ORAL | Status: DC | PRN

## 2023-10-12 MED ORDER — INSULIN ASPART 100 UNIT/ML IJ SOLN
0.0000 [IU] | Freq: Every day | INTRAMUSCULAR | Status: DC
  Administered 2023-10-12: 5 [IU] via SUBCUTANEOUS
  Administered 2023-10-13: 3 [IU] via SUBCUTANEOUS

## 2023-10-12 MED ORDER — LOSARTAN POTASSIUM 25 MG PO TABS: 50.0000 mg | ORAL_TABLET | Freq: Two times a day (BID) | ORAL | Status: DC

## 2023-10-12 MED ORDER — ATORVASTATIN CALCIUM 40 MG PO TABS
20.0000 mg | ORAL_TABLET | Freq: Every day | ORAL | Status: DC
  Administered 2023-10-12 – 2023-10-13 (×2): 20 mg via ORAL
  Filled 2023-10-12 (×2): qty 1
  Filled 2023-10-12: qty 2

## 2023-10-12 MED ORDER — INSULIN ASPART 100 UNIT/ML IJ SOLN
0.0000 [IU] | Freq: Three times a day (TID) | INTRAMUSCULAR | Status: DC
  Administered 2023-10-12: 8 [IU] via SUBCUTANEOUS
  Administered 2023-10-12: 5 [IU] via SUBCUTANEOUS
  Administered 2023-10-13: 8 [IU] via SUBCUTANEOUS
  Administered 2023-10-13: 3 [IU] via SUBCUTANEOUS
  Administered 2023-10-13: 5 [IU] via SUBCUTANEOUS
  Administered 2023-10-14: 11 [IU] via SUBCUTANEOUS
  Administered 2023-10-14: 8 [IU] via SUBCUTANEOUS

## 2023-10-12 MED ORDER — METHYLPREDNISOLONE SODIUM SUCC 40 MG IJ SOLR
40.0000 mg | Freq: Two times a day (BID) | INTRAMUSCULAR | Status: DC
  Administered 2023-10-12 – 2023-10-14 (×5): 40 mg via INTRAVENOUS
  Filled 2023-10-12 (×6): qty 1

## 2023-10-12 MED ORDER — IPRATROPIUM-ALBUTEROL 0.5-2.5 (3) MG/3ML IN SOLN: 3.0000 mL | Freq: Four times a day (QID) | RESPIRATORY_TRACT | Status: DC | PRN

## 2023-10-12 MED ORDER — METOPROLOL TARTRATE 25 MG PO TABS
25.0000 mg | ORAL_TABLET | Freq: Two times a day (BID) | ORAL | Status: DC
  Administered 2023-10-12 – 2023-10-13 (×3): 25 mg via ORAL
  Filled 2023-10-12 (×3): qty 1

## 2023-10-12 MED ORDER — SODIUM CHLORIDE 0.9 % IV SOLN
2.0000 g | Freq: Once | INTRAVENOUS | Status: AC
  Administered 2023-10-12: 2 g via INTRAVENOUS
  Filled 2023-10-12: qty 20

## 2023-10-12 MED ORDER — RIVAROXABAN 15 MG PO TABS
15.0000 mg | ORAL_TABLET | Freq: Every day | ORAL | Status: DC
  Administered 2023-10-12 – 2023-10-13 (×2): 15 mg via ORAL
  Filled 2023-10-12 (×2): qty 1

## 2023-10-12 MED ORDER — IOHEXOL 300 MG/ML  SOLN
75.0000 mL | Freq: Once | INTRAMUSCULAR | Status: AC | PRN
  Administered 2023-10-12: 75 mL via INTRAVENOUS

## 2023-10-12 MED ORDER — ONDANSETRON HCL 4 MG PO TABS: 4.0000 mg | ORAL_TABLET | Freq: Four times a day (QID) | ORAL | Status: DC | PRN

## 2023-10-12 MED ORDER — ACETAMINOPHEN 325 MG PO TABS: 650.0000 mg | ORAL_TABLET | Freq: Four times a day (QID) | ORAL | Status: DC | PRN

## 2023-10-12 MED ORDER — SODIUM CHLORIDE 0.9 % IV SOLN
2.0000 g | INTRAVENOUS | Status: DC
  Administered 2023-10-13 – 2023-10-14 (×2): 2 g via INTRAVENOUS
  Filled 2023-10-12 (×2): qty 20

## 2023-10-12 MED ORDER — LORATADINE 10 MG PO TABS
10.0000 mg | ORAL_TABLET | Freq: Every day | ORAL | Status: DC
Start: 2023-10-12 — End: 2023-10-15
  Administered 2023-10-12 – 2023-10-14 (×3): 10 mg via ORAL
  Filled 2023-10-12 (×3): qty 1

## 2023-10-12 MED ORDER — ONDANSETRON HCL 4 MG/2ML IJ SOLN: 4.0000 mg | Freq: Four times a day (QID) | INTRAMUSCULAR | Status: DC | PRN

## 2023-10-12 MED ORDER — ACETAMINOPHEN 650 MG RE SUPP: 650.0000 mg | Freq: Four times a day (QID) | RECTAL | Status: DC | PRN

## 2023-10-12 MED ORDER — TAMSULOSIN HCL 0.4 MG PO CAPS
0.4000 mg | ORAL_CAPSULE | Freq: Every day | ORAL | Status: DC
  Administered 2023-10-12 – 2023-10-13 (×2): 0.4 mg via ORAL
  Filled 2023-10-12 (×2): qty 1

## 2023-10-12 MED ORDER — SODIUM CHLORIDE 0.9 % IV SOLN: INTRAVENOUS | Status: AC

## 2023-10-12 MED ORDER — INSULIN GLARGINE-YFGN 100 UNIT/ML ~~LOC~~ SOLN
10.0000 [IU] | Freq: Every day | SUBCUTANEOUS | Status: DC
  Administered 2023-10-12: 10 [IU] via SUBCUTANEOUS
  Filled 2023-10-12 (×2): qty 0.1

## 2023-10-12 MED ORDER — DM-GUAIFENESIN ER 30-600 MG PO TB12
1.0000 | ORAL_TABLET | Freq: Two times a day (BID) | ORAL | Status: DC
  Administered 2023-10-12 – 2023-10-14 (×5): 1 via ORAL
  Filled 2023-10-12 (×5): qty 1

## 2023-10-12 MED ORDER — SODIUM CHLORIDE 0.9 % IV SOLN
500.0000 mg | INTRAVENOUS | Status: DC
  Administered 2023-10-13 – 2023-10-14 (×2): 500 mg via INTRAVENOUS
  Filled 2023-10-12 (×2): qty 5

## 2023-10-12 MED ORDER — EZETIMIBE 10 MG PO TABS
10.0000 mg | ORAL_TABLET | Freq: Every day | ORAL | Status: DC
  Administered 2023-10-12 – 2023-10-13 (×2): 10 mg via ORAL
  Filled 2023-10-12 (×2): qty 1

## 2023-10-12 MED ORDER — SODIUM CHLORIDE 0.9 % IV SOLN
500.0000 mg | INTRAVENOUS | Status: DC
  Administered 2023-10-12: 500 mg via INTRAVENOUS
  Filled 2023-10-12: qty 5

## 2023-10-12 MED ORDER — BUDESONIDE 0.5 MG/2ML IN SUSP
0.5000 mg | Freq: Two times a day (BID) | RESPIRATORY_TRACT | Status: DC
  Administered 2023-10-12 – 2023-10-14 (×5): 0.5 mg via RESPIRATORY_TRACT
  Filled 2023-10-12 (×5): qty 2

## 2023-10-12 MED ORDER — TRAZODONE HCL 50 MG PO TABS
50.0000 mg | ORAL_TABLET | Freq: Every evening | ORAL | Status: DC | PRN
  Administered 2023-10-12: 50 mg via ORAL
  Filled 2023-10-12: qty 1

## 2023-10-12 NOTE — Assessment & Plan Note (Signed)
-  A1c 8.0 -Holding oral hypoglycemic agents while inpatient -Sliding scale insulin and the use of Semglee -Anticipate elevation in his CBGs secondary to the use of his steroids.  -Will follow CBG fluctuation and adjust regimen as required.

## 2023-10-12 NOTE — Assessment & Plan Note (Signed)
Continue statin. 

## 2023-10-12 NOTE — Assessment & Plan Note (Signed)
-  Continue the use of Flomax. ?

## 2023-10-12 NOTE — Assessment & Plan Note (Signed)
-  Stable and at baseline -Maintain adequate hydration -Minimize the use of nephrotoxic agents, hypotension and the use of contrast. -Follow renal function trend.

## 2023-10-12 NOTE — Progress Notes (Signed)
Pt had bloody nose after blowing nose and trying to clean it out. Bright red blood from right nostril. Pressure applied and bleeding controlled. Kellogg RN

## 2023-10-12 NOTE — Plan of Care (Signed)
  Problem: Health Behavior/Discharge Planning: Goal: Ability to manage health-related needs will improve Outcome: Progressing   Problem: Clinical Measurements: Goal: Respiratory complications will improve Outcome: Progressing   

## 2023-10-12 NOTE — ED Notes (Signed)
Patient transported to CT 

## 2023-10-12 NOTE — ED Provider Notes (Signed)
[facility]-EMERGENCY DEPT [facility] Emergency Department Provider Note MRN:  [id number]  Arrival date & time: 10/12/23     Chief Complaint   Cough   History of Present Illness   Max Davenport is a 77 y.o. year-old male with a history of A-fib, hypertension, diabetes presenting to the ED with chief complaint of cough.  Persistent cough for 2 to 3 months, recently provided with steroids and antibiotics, finish the full course few days, still coughing, has lost 16 pounds over the past 2 weeks.  Scheduled for CT scan in 4 days.  Review of Systems  A thorough review of systems was obtained and all systems are negative except as noted in the HPI and PMH.   Patient's Health History    Past Medical History:  Diagnosis Date   Atrial fibrillation (HCC)    Essential hypertension    Hyperlipidemia    Skin cancer, basal cell    Type 2 diabetes mellitus (HCC)     Past Surgical History:  Procedure Laterality Date   BASAL CELL CARCINOMA EXCISION     Top of head   INGUINAL HERNIA REPAIR Right    LUMBAR FUSION  2008   NASAL RECONSTRUCTION     WISDOM TOOTH EXTRACTION      Family History  Adopted: Yes    Social History   Socioeconomic History   Marital status: Single    Spouse name: Not on file   Number of children: Not on file   Years of education: Not on file   Highest education level: Not on file  Occupational History   Not on file  Tobacco Use   Smoking status: Never   Smokeless tobacco: Never  Vaping Use   Vaping status: Never Used  Substance and Sexual Activity   Alcohol use: Never   Drug use: Never   Sexual activity: Not on file  Other Topics Concern   Not on file  Social History Narrative   Not on file   Social Determinants of Health   Financial Resource Strain: Not on file  Food Insecurity: Not on file  Transportation Needs: Not on file  Physical Activity: Not on file  Stress: Not on file  Social Connections: Not on file  Intimate Partner Violence: Not on  file     Physical Exam   Vitals:   10/12/23 0230 10/12/23 0400  BP: (!) 149/98 (!) 154/93  Pulse: 89 99  Resp: (!) 28 19  Temp:    SpO2: 95% 94%    CONSTITUTIONAL: Well-appearing, NAD NEURO/PSYCH:  Alert and oriented x 3, no focal deficits EYES:  eyes equal and reactive ENT/NECK:  no LAD, no JVD CARDIO: Tachycardic rate, well-perfused, normal S1 and S2 PULM:  CTAB no wheezing or rhonchi GI/GU:  non-distended, non-tender MSK/SPINE:  No gross deformities, no edema SKIN:  no rash, atraumatic   *Additional and/or pertinent findings included in MDM below  Diagnostic and Interventional Summary    EKG Interpretation Date/Time:  Tuesday October 12 2023 00:19:45 EDT Ventricular Rate:  99 PR Interval:    QRS Duration:  77 QT Interval:  324 QTC Calculation: 416 R Axis:   56  Text Interpretation: Atrial fibrillation Ventricular premature complex Anterior infarct, old Confirmed by Kennis Carina [phone]) on 10/12/2023 1:38:05 AM       Labs Reviewed  CBC WITH DIFFERENTIAL/PLATELET - Abnormal; Notable for the following components:      Result Value   Abs Immature Granulocytes 0.08 (*)    All other components  within normal limits  COMPREHENSIVE METABOLIC PANEL - Abnormal; Notable for the following components:   Sodium 134 (*)    Glucose, Bld 222 (*)    Creatinine, Ser 1.65 (*)    Calcium 8.6 (*)    Albumin 2.6 (*)    GFR, Estimated 43 (*)    All other components within normal limits    CT Chest W Contrast  Final Result    DG Chest 2 View  Final Result      Medications  LORazepam (ATIVAN) injection 1 mg (has no administration in time range)  azithromycin (ZITHROMAX) 500 mg in sodium chloride 0.9 % 250 mL IVPB (has no administration in time range)  cefTRIAXone (ROCEPHIN) 2 g in sodium chloride 0.9 % 100 mL IVPB (has no administration in time range)  albuterol (VENTOLIN HFA) 108 (90 Base) MCG/ACT inhaler 2 puff (2 puffs Inhalation Given 10/12/23 0010)  iohexol  (OMNIPAQUE) 300 MG/ML solution 75 mL (75 mLs Intravenous Contrast Given 10/12/23 0105)     Procedures  /  Critical Care Procedures  ED Course and Medical Decision Making  Initial Impression and Ddx Suspicion for pneumonia failing outpatient therapy, possible underlying malignancy especially given the weight loss.  Patient was anticoagulated and endorses full compliance with the Xarelto and so doubt PE.  Past medical/surgical history that increases complexity of ED encounter: Diabetes, A-fib  Interpretation of Diagnostics I personally reviewed the Chest Xray and my interpretation is as follows: Pneumonia, no pneumothorax  Labs overall reassuring with no significant blood count or electrolyte disturbance.  CT revealing multifocal pneumonia, possibly cryptogenic  Patient Reassessment and Ultimate Disposition/Management     Options discussed with patient, plan is for admission for IV antibiotics.  Patient management required discussion with the following services or consulting groups:  Hospitalist Service  Complexity of Problems Addressed Acute illness or injury that poses threat of life of bodily function  Additional Data Reviewed and Analyzed Further history obtained from: Further history from spouse/family member  Additional Factors Impacting ED Encounter Risk Consideration of hospitalization  Elmer Sow. Pilar Plate, MD [facility] Emergency Medicine [facility] [email or web address]  Final Clinical Impressions(s) / ED Diagnoses     ICD-10-CM   1. Multifocal pneumonia  J18.9       ED Discharge Orders     None        Discharge Instructions Discussed with and Provided to Patient:   Discharge Instructions   None      Sabas Sous, MD 10/12/23 [phone]

## 2023-10-12 NOTE — Assessment & Plan Note (Signed)
-  Continue current antihypertensive agents -Follow vital signs -heart healthy diet discussed with patient.

## 2023-10-12 NOTE — Assessment & Plan Note (Addendum)
-  Patient with multifocal pneumonia appreciated on CT scan -Positive COVID PCR -Will check procalcitonin -Empirical antibiotic started -Steroids and bronchodilator will be provided -Consult precaution requested and will also check for full viral panel. -Check inflammatory markers.

## 2023-10-12 NOTE — Progress Notes (Signed)
Transition of Care Department [facility]) has reviewed patient and no TOC needs have been identified at this time. We will continue to monitor patient advancement through interdisciplinary progression rounds. If new patient transition needs arise, please place a TOC consult.   10/12/23 1042  TOC Brief Assessment  Insurance and Status Reviewed  Patient has primary care physician Yes  Home environment has been reviewed Lives alone.  Prior level of function: Independent at baseline.  Prior/Current Home Services No current home services  Social Determinants of Health Reivew SDOH reviewed no interventions necessary  Readmission risk has been reviewed Yes  Transition of care needs no transition of care needs at this time

## 2023-10-12 NOTE — H&P (Signed)
History and Physical    Patient: Max Davenport:[id number] DOB: [birth year] DOA: 10/11/2023 DOS: the patient was seen and examined on 10/12/2023 PCP: Benita Stabile, MD  Patient coming from: Home  Chief Complaint:  Chief Complaint  Patient presents with   Cough   HPI: Max Davenport is a 77 y.o. male with medical history significant of chronic atrial fibrillation on Xarelto, hypertension, hyperlipidemia, chronic kidney disease stage IIIb, type 2 diabetes mellitus with nephropathy, BPH and prior history of skin cancer; who presented to the hospital secondary to coughing spells, short winded sensation with activity and not feeling well.  Patient reports overall symptoms have been present for at least a month prior to admission and worsening. Workup in the emergency department demonstrated multifocal pneumonia on images and a positive COVID PCR.  Further discussions with radiologist suggesting the presence of complicated organizing pneumonia versus pulmonary fibrosis and Boop.  Patient reports some chills but no frank fever.  There has not been any nausea, vomiting, chest pain, palpitations, abdominal pain, focal weaknesses, dysuria, hematuria, melena or hematochezia.  Patient expressed general malaise and URI congestive symptoms.  Prior to admission he has done short course of his steroids and antibiotics without much improvement.  Bronchodilator management, cultures and initiation of antibiotics provided while in the ED -TR[facility] contacted to place in the hospital for further evaluation and management.  Review of Systems: As mentioned in the history of present illness. All other systems reviewed and are negative. Past Medical History:  Diagnosis Date   Atrial fibrillation (HCC)    Essential hypertension    Hyperlipidemia    Skin cancer, basal cell    Type 2 diabetes mellitus (HCC)    Past Surgical History:  Procedure Laterality Date   BASAL CELL CARCINOMA EXCISION     Top of head    INGUINAL HERNIA REPAIR Right    LUMBAR FUSION  2008   NASAL RECONSTRUCTION     WISDOM TOOTH EXTRACTION     Social History:  reports that he has never smoked. He has never used smokeless tobacco. He reports that he does not drink alcohol and does not use drugs.  No Known Allergies  Family History  Adopted: Yes    Prior to Admission medications   Medication Sig Start Date End Date Taking? Authorizing Provider  acetaminophen (TYLENOL) 500 MG tablet Take 500 mg by mouth every 6 (six) hours as needed for mild pain (pain score 1-3).   Yes [provider]  atorvastatin (LIPITOR) 20 MG tablet Take 20 mg by mouth at bedtime.   Yes [provider]  doxycycline (MONODOX) 100 MG capsule Take 100 mg by mouth 2 (two) times daily. For 21 days   Yes [provider]  ezetimibe (ZETIA) 10 MG tablet TAKE 1 TABLET BY MOUTH AT BEDTIME FOR CHOLESTEROL. 03/08/23  Yes Strader, [place] M, PA-C  FARXIGA 10 MG TABS tablet Take 10 mg by mouth daily. 08/02/23  Yes [provider]  Lactobacillus (PROBIOTIC ACIDOPHILUS PO) Take 1 capsule by mouth daily.   Yes [provider]  levocetirizine (XYZAL) 5 MG tablet Take 5 mg by mouth at bedtime.   Yes [provider]  losartan (COZAAR) 50 MG tablet Take 50 mg by mouth 2 (two) times daily.   Yes [provider]  meclizine (ANTIVERT) 25 MG tablet Take 25 mg by mouth 3 (three) times daily as needed. 01/13/22  Yes [provider]  methocarbamol (ROBAXIN) 500 MG tablet Take 1 tablet (500  mg total) by mouth every 8 (eight) hours as needed for muscle spasms. 07/07/23  Yes Magnant, Charles L, PA-C  metoprolol succinate (TOPROL-XL) 25 MG 24 hr tablet TAKE 1/2 TABLET BY MOUTH DAILY. 07/29/23  Yes Jonelle Sidle, MD  Multiple Vitamin (MULTIVITAMIN) tablet Take 1 tablet by mouth daily.   Yes [provider]  oxymetazoline (MUCINEX SINUS-MAX CLEAR & COOL) 0.05 % nasal spray Place 1 spray into both nostrils 2  (two) times daily as needed for congestion.   Yes [provider]  phenylephrine (SUDAFED PE) 10 MG TABS tablet Take 10 mg by mouth every 4 (four) hours as needed (congestion).   Yes [provider]  predniSONE (DELTASONE) 10 MG tablet Take 10 mg by mouth daily with breakfast.   Yes [provider]  Rivaroxaban (XARELTO) 15 MG TABS tablet Take 15 mg by mouth daily with supper.   Yes [provider]  tamsulosin (FLOMAX) 0.4 MG CAPS capsule Take 0.4 mg by mouth daily after supper.   Yes [provider]  traZODone (DESYREL) 50 MG tablet Take 50 mg by mouth at bedtime. 08/02/23  Yes [provider]    Physical Exam: Vitals:   10/12/23 0838 10/12/23 0921 10/12/23 0942 10/12/23 1354  BP:  (!) 160/98  (!) 166/99  Pulse:  (!) 108  (!) 107  Resp:  18    Temp:  98.3 F (36.8 C)  98.5 F (36.9 C)  TempSrc:  Oral  Oral  SpO2: 97% 98%  94%  Weight:   88.3 kg   Height:   5\' 9"  (1.753 m)    General exam: Alert, awake, oriented x 3; intermittent nonproductive coughing spells.  Afebrile and expressing no chest pain. Respiratory system: Mild expiratory wheezing, positive rhonchi bilaterally; no using accessory muscle.  Good saturation on room air. Cardiovascular system: Irregular, no rubs, no gallops, no JVD. Gastrointestinal system: Abdomen is nondistended, soft and nontender. No organomegaly or masses felt. Normal bowel sounds heard. Central nervous system: No focal neurological deficits. Extremities: No cyanosis, clubbing or edema. Skin: No rashes, petechiae. Psychiatry: Judgement and insight appear normal. Mood & affect appropriate.   Data Reviewed: CBC: White blood cells 8.7, hemoglobin 16.6 and platelet count 174K Comprehensive metabolic panel: Sodium 134, potassium 4.9, chloride 99, bicarb 26, BUN 22, glucose 222, creatinine 1.65, normal LFTs and a GFR of 43. A1c 8.0 Magnesium 1.9 Phosphorus: 2.5  Assessment and Plan: CAP (community  acquired pneumonia) -Patient with multifocal pneumonia appreciated on CT scan -Positive COVID PCR -Will check procalcitonin -Empirical antibiotic started -Steroids and bronchodilator will be provided -Consult precaution requested and will also check for full viral panel. -Check inflammatory markers.   Persistent atrial fibrillation (HCC) -Resuming home rate controlling agents and the use of Xarelto. -Telemetry monitoring will be provided. -Close follow-up to electrolytes with repletion as needed (goal is for potassium above 4 magnesium above 2).  Uncontrolled type 2 diabetes mellitus with hyperglycemia, without long-term current use of insulin (HCC) -A1c 8.0 -Holding oral hypoglycemic agents while inpatient -Sliding scale insulin and the use of Semglee -Anticipate elevation in his CBGs secondary to the use of his steroids.  -Will follow CBG fluctuation and adjust regimen as required.  Chronic kidney disease, stage 3b (HCC) -Stable and at baseline -Maintain adequate hydration -Minimize the use of nephrotoxic agents, hypotension and the use of contrast. -Follow renal function trend.  BPH (benign prostatic hyperplasia) -Continue the use of Flomax.  HLD (hyperlipidemia) -Continue statin.  HTN (hypertension) -Continue current antihypertensive  agents -Follow vital signs -heart healthy diet discussed with patient.     Advance Care Planning:   Code Status: Full Code   Consults: None  Family Communication: Sister at bedside.  Severity of Illness: The appropriate patient status for this patient is INPATIENT. Inpatient status is judged to be reasonable and necessary in order to provide the required intensity of service to ensure the patient's safety. The patient's presenting symptoms, physical exam findings, and initial radiographic and laboratory data in the context of their chronic comorbidities is felt to place them at high risk for further clinical deterioration. Furthermore,  it is not anticipated that the patient will be medically stable for discharge from the hospital within 2 midnights of admission.   * I certify that at the point of admission it is my clinical judgment that the patient will require inpatient hospital care spanning beyond 2 midnights from the point of admission due to high intensity of service, high risk for further deterioration and high frequency of surveillance required.*  Author: Vassie Loll, MD 10/12/2023 5:52 PM  For on call review [email or web address].

## 2023-10-12 NOTE — Assessment & Plan Note (Signed)
-  Resuming home rate controlling agents and the use of Xarelto. -Telemetry monitoring will be provided. -Close follow-up to electrolytes with repletion as needed (goal is for potassium above 4 magnesium above 2).

## 2023-10-13 DIAGNOSIS — J1282 Pneumonia due to coronavirus disease 2019: Secondary | ICD-10-CM | POA: Diagnosis not present

## 2023-10-13 DIAGNOSIS — U071 COVID-19: Secondary | ICD-10-CM | POA: Diagnosis not present

## 2023-10-13 LAB — GLUCOSE, CAPILLARY
Glucose-Capillary: 266 mg/dL — ABNORMAL HIGH (ref 70–99)
Glucose-Capillary: 195 mg/dL — ABNORMAL HIGH (ref 70–99)
Glucose-Capillary: 241 mg/dL — ABNORMAL HIGH (ref 70–99)
Glucose-Capillary: 277 mg/dL — ABNORMAL HIGH (ref 70–99)

## 2023-10-13 LAB — BASIC METABOLIC PANEL
Anion gap: 10 (ref 5–15)
BUN: 26 mg/dL — ABNORMAL HIGH (ref 8–23)
CO2: 21 mmol/L — ABNORMAL LOW (ref 22–32)
Calcium: 8.4 mg/dL — ABNORMAL LOW (ref 8.9–10.3)
Chloride: 102 mmol/L (ref 98–111)
Creatinine, Ser: 1.56 mg/dL — ABNORMAL HIGH (ref 0.61–1.24)
GFR, Estimated: 45 mL/min — ABNORMAL LOW (ref >60)
Glucose, Bld: 234 mg/dL — ABNORMAL HIGH (ref 70–99)
Potassium: 5 mmol/L (ref 3.5–5.1)
Sodium: 133 mmol/L — ABNORMAL LOW (ref 135–145)

## 2023-10-13 LAB — CBC
HCT: 42.7 % (ref 39.0–52.0)
Hemoglobin: 15 g/dL (ref 13.0–17.0)
MCH: 32.7 pg (ref 26.0–34.0)
MCHC: 35.1 g/dL (ref 30.0–36.0)
MCV: 93 fL (ref 80.0–100.0)
Platelets: 194 10*3/uL (ref 150–400)
RBC: 4.59 MIL/uL (ref 4.22–5.81)
RDW: 11.9 % (ref 11.5–15.5)
WBC: 11 10*3/uL — ABNORMAL HIGH (ref 4.0–10.5)
nRBC: 0 % (ref 0.0–0.2)

## 2023-10-13 MED ORDER — TRAZODONE HCL 50 MG PO TABS: 100.0000 mg | ORAL_TABLET | Freq: Every evening | ORAL | Status: DC | PRN

## 2023-10-13 MED ORDER — RISAQUAD PO CAPS
2.0000 | ORAL_CAPSULE | Freq: Every day | ORAL | Status: DC
  Administered 2023-10-13 – 2023-10-14 (×2): 2 via ORAL
  Filled 2023-10-13 (×2): qty 2

## 2023-10-13 MED ORDER — INSULIN GLARGINE-YFGN 100 UNIT/ML ~~LOC~~ SOLN
15.0000 [IU] | Freq: Every day | SUBCUTANEOUS | Status: DC
  Administered 2023-10-13: 15 [IU] via SUBCUTANEOUS
  Filled 2023-10-13 (×2): qty 0.15

## 2023-10-13 MED ORDER — METOPROLOL TARTRATE 50 MG PO TABS
50.0000 mg | ORAL_TABLET | Freq: Two times a day (BID) | ORAL | Status: DC
  Administered 2023-10-13 – 2023-10-14 (×2): 50 mg via ORAL
  Filled 2023-10-13 (×2): qty 1

## 2023-10-13 MED ORDER — MELATONIN 3 MG PO TABS
4.5000 mg | ORAL_TABLET | Freq: Every day | ORAL | Status: DC
  Administered 2023-10-13: 4.5 mg via ORAL
  Filled 2023-10-13: qty 2

## 2023-10-13 NOTE — Progress Notes (Signed)
Pt ambulated in room on room air without difficulty, pt denied SOB, oxygen remained 95% or higher.

## 2023-10-13 NOTE — Progress Notes (Signed)
Pt restless and continues to remove cardiac monitor. He reports he can't get comfortable. He is ready to go home. He Is alert and oriented x 4 and is pleasant just missing home. Cardiac monitor replaced and patient sits in recliner. PRN ativan given to help calm patient so he can rest. Kellogg RN

## 2023-10-13 NOTE — Progress Notes (Signed)
PROGRESS NOTE    Max Davenport  WUJ:[id number] DOB: 08-19-1946 DOA: 10/11/2023 PCP: Benita Stabile, MD    Brief Narrative:   Max Davenport is a 77 y.o. male with medical history significant of chronic atrial fibrillation on Xarelto, hypertension, hyperlipidemia, chronic kidney disease stage IIIb, type 2 diabetes mellitus with nephropathy, BPH and prior history of skin cancer; who presented to the hospital secondary to coughing spells, short winded sensation with activity and not feeling well.  Patient reports overall symptoms have been present for at least a month prior to admission and worsening. Workup in the emergency department demonstrated multifocal pneumonia on images and a positive COVID PCR.    Assessment and Plan: CAP (community acquired pneumonia) with COVID 19 -Patient with multifocal pneumonia appreciated on CT scan -Positive COVID PCR -Empiric antibiotic started -Steroids and neb will be provided -? Need for outpatient pulmonary referral Home O2 eval   Persistent atrial fibrillation (HCC) -Resuming home rate controlling agents and the use of Xarelto. -Telemetry monitoring will be provided.   Uncontrolled type 2 diabetes mellitus with hyperglycemia, without long-term current use of insulin (HCC) -A1c 8.0 -Holding oral hypoglycemic agents while inpatient -Sliding scale insulin and the use of Semglee -Anticipate elevation in his CBGs secondary to the use of his steroids.    Chronic kidney disease, stage 3b (HCC) -Stable and at baseline -Maintain adequate hydration -Minimize the use of nephrotoxic agents, hypotension and the use of contrast. -Follow renal function trend.   BPH (benign prostatic hyperplasia) -Continue the use of Flomax.   HLD (hyperlipidemia) -Continue statin.   HTN (hypertension) -Continue current antihypertensive agents -Follow vital signs -heart healthy diet discussed with patient.    DVT prophylaxis:  Rivaroxaban (XARELTO) tablet 15 mg     Code Status: Full Code Family Communication: at bedside  Disposition Plan:  Level of care: Telemetry Status is: Inpatient Remains inpatient appropriate     Consultants:  none   Subjective: Cough better  Objective: Vitals:   10/12/23 2100 10/13/23 0145 10/13/23 0509 10/13/23 0906  BP:   (!) 148/102   Pulse:      Resp: (!) 21 18    Temp:   98.4 F (36.9 C)   TempSrc:   Oral   SpO2:   96% 95%  Weight:      Height:        Intake/Output Summary (Last 24 hours) at 10/13/2023 1053 Last data filed at 10/12/2023 1956 Gross per 24 hour  Intake 469.38 ml  Output 150 ml  Net 319.38 ml   Filed Weights   10/11/23 2105 10/12/23 0942  Weight: 87.5 kg 88.3 kg    Examination:   General: Appearance:     Overweight male in no acute distress     Lungs:    respirations unlabored  Heart:    Tachycardic.    MS:   All extremities are intact.    Neurologic:   Awake, alert, oriented x 3. No apparent focal neurological           defect.        Data Reviewed: I have personally reviewed following labs and imaging studies  CBC: Recent Labs  Lab 10/12/23 0010 10/13/23 0506  WBC 8.7 11.0*  NEUTROABS 6.8  --   HGB 16.6 15.0  HCT 47.2 42.7  MCV 93.8 93.0  PLT 174 194   Basic Metabolic Panel: Recent Labs  Lab 10/12/23 0010 10/12/23 1008 10/13/23 0506  NA 134*  --  133*  K  4.9  --  5.0  CL 99  --  102  CO2 26  --  21*  GLUCOSE 222*  --  234*  BUN 22  --  26*  CREATININE 1.65*  --  1.56*  CALCIUM 8.6*  --  8.4*  MG  --  1.9  --   PHOS  --  2.5  --    GFR: Estimated Creatinine Clearance: 43.6 mL/min (A) (by C-G formula based on SCr of 1.56 mg/dL (H)). Liver Function Tests: Recent Labs  Lab 10/12/23 0010  AST 19  ALT 25  ALKPHOS 80  BILITOT 0.9  PROT 6.9  ALBUMIN 2.6*   No results for input(s): "LIPASE", "AMYLASE" in the last 168 hours. No results for input(s): "AMMONIA" in the last 168 hours. Coagulation Profile: No results for input(s): "INR",  "PROTIME" in the last 168 hours. Cardiac Enzymes: No results for input(s): "CKTOTAL", "CKMB", "CKMBINDEX", "TROPONINI" in the last 168 hours. BNP (last 3 results) No results for input(s): "PROBNP" in the last 8760 hours. HbA1C: Recent Labs    10/12/23 0805  HGBA1C 8.0*   CBG: Recent Labs  Lab 10/12/23 0939 10/12/23 1142 10/12/23 1617 10/12/23 1954 10/13/23 0756  GLUCAP 197* 229* 256* 342* 266*   Lipid Profile: No results for input(s): "CHOL", "HDL", "LDLCALC", "TRIG", "CHOLHDL", "LDLDIRECT" in the last 72 hours. Thyroid Function Tests: No results for input(s): "TSH", "T4TOTAL", "FREET4", "T3FREE", "THYROIDAB" in the last 72 hours. Anemia Panel: No results for input(s): "VITAMINB12", "FOLATE", "FERRITIN", "TIBC", "IRON", "RETICCTPCT" in the last 72 hours. Sepsis Labs: Recent Labs  Lab 10/12/23 1806  PROCALCITON 0.27    Recent Results (from the past 240 hour(s))  SARS Coronavirus 2 by RT PCR (hospital order, performed in [facility] hospital lab) *cepheid single result test* Anterior Nasal Swab     Status: Abnormal   Collection Time: 10/12/23  7:11 AM   Specimen: Anterior Nasal Swab  Result Value Ref Range Status   SARS Coronavirus 2 by RT PCR POSITIVE (A) NEGATIVE Final    Comment: (NOTE) SARS-CoV-2 target nucleic acids are DETECTED  SARS-CoV-2 RNA is generally detectable in upper respiratory specimens  during the acute phase of infection.  Positive results are indicative  of the presence of the identified virus, but do not rule out bacterial infection or co-infection with other pathogens not detected by the test.  Clinical correlation with patient history and  other diagnostic information is necessary to determine patient infection status.  The expected result is negative.  Fact Sheet for Patients:   RoadLapTop.co.za   Fact Sheet for Healthcare Providers:   http://kim-miller.com/    This test is not yet approved or  cleared by the [place] FDA and  has been authorized for detection and/or diagnosis of SARS-CoV-2 by FDA under an Emergency Use Authorization (EUA).  This EUA will remain in effect (meaning this test can be used) for the duration of  the COVID-19 declaration under Section 564(b)(1)  of the Act, 21 U.S.C. section 360-bbb-3(b)(1), unless the authorization is terminated or revoked sooner.   Performed at [facility], [address], [place], [place] [address]   Respiratory (~20 pathogens) panel by PCR     Status: None   Collection Time: 10/12/23  8:18 AM   Specimen: Nasopharyngeal Swab; Respiratory  Result Value Ref Range Status   Adenovirus NOT DETECTED NOT DETECTED Final   Coronavirus 229E NOT DETECTED NOT DETECTED Final    Comment: (NOTE) The Coronavirus on the Respiratory Panel, DOES NOT test  for the novel  Coronavirus (2019 nCoV)    Coronavirus HKU1 NOT DETECTED NOT DETECTED Final   Coronavirus NL63 NOT DETECTED NOT DETECTED Final   Coronavirus OC43 NOT DETECTED NOT DETECTED Final   Metapneumovirus NOT DETECTED NOT DETECTED Final   Rhinovirus / Enterovirus NOT DETECTED NOT DETECTED Final   Influenza A NOT DETECTED NOT DETECTED Final   Influenza B NOT DETECTED NOT DETECTED Final   Parainfluenza Virus 1 NOT DETECTED NOT DETECTED Final   Parainfluenza Virus 2 NOT DETECTED NOT DETECTED Final   Parainfluenza Virus 3 NOT DETECTED NOT DETECTED Final   Parainfluenza Virus 4 NOT DETECTED NOT DETECTED Final   Respiratory Syncytial Virus NOT DETECTED NOT DETECTED Final   Bordetella pertussis NOT DETECTED NOT DETECTED Final   Bordetella Parapertussis NOT DETECTED NOT DETECTED Final   Chlamydophila pneumoniae NOT DETECTED NOT DETECTED Final   Mycoplasma pneumoniae NOT DETECTED NOT DETECTED Final    Comment: Performed at [facility], [address] [place], [place] [address]         Radiology Studies: CT Chest W Contrast  Result Date: 10/12/2023 CLINICAL DATA:  Cough,  chronic, persisting greater than 8 weeks, failed empiric treatment EXAM: CT CHEST WITH CONTRAST TECHNIQUE: Multidetector CT imaging of the chest was performed during intravenous contrast administration. RADIATION DOSE REDUCTION: This exam was performed according to the departmental dose-optimization program which includes automated exposure control, adjustment of the mA and/or kV according to patient size and/or use of iterative reconstruction technique. CONTRAST:  75mL OMNIPAQUE IOHEXOL 300 MG/ML  SOLN COMPARISON:  Radiographs earlier today FINDINGS: Cardiovascular: Normal heart size. No pericardial effusion. Coronary artery and aortic atherosclerotic calcification. Mediastinum/Nodes: Trachea and esophagus are unremarkable. Borderline mediastinal and left hilar adenopathy. For example 1.1 cm pretracheal lymph node on 2/54 and 1.1 cm left hilar lymph node on 2/71. Lungs/Pleura: Bilateral peripheral and peribronchovascular predominant consolidation and ground-glass opacities greatest in the left lower lobe. Small cyst with cavitation in the posterior left lower lobe (3/88). No pleural effusion or pneumothorax. Upper Abdomen: No acute abnormality. Musculoskeletal: No acute fracture. IMPRESSION: Multifocal airspace opacities. This may represent multifocal pneumonia. In the setting of chronic symptoms, differential considerations include cryptogenic organizing pneumonia and chronic eosinophilic pneumonia. Aortic Atherosclerosis (ICD10-I70.0). Electronically Signed   By: Minerva Fester M.D.   On: 10/12/2023 03:54   DG Chest 2 View  Result Date: 10/12/2023 CLINICAL DATA:  Cough, weight loss EXAM: CHEST - 2 VIEW COMPARISON:  08/26/2006 FINDINGS: The basilar airspace opacities compatible with pneumonia. Suspected small left pleural effusion. No pneumothorax. Normal cardiomediastinal silhouette. IMPRESSION: Left lower lobe pneumonia. Follow-up in 6-8 weeks after treatment is recommended to ensure resolution.  Electronically Signed   By: Minerva Fester M.D.   On: 10/12/2023 00:19        Scheduled Meds:  acidophilus  2 capsule Oral Daily   atorvastatin  20 mg Oral Daily   budesonide (PULMICORT) nebulizer solution  0.5 mg Nebulization BID   dextromethorphan-guaiFENesin  1 tablet Oral BID   ezetimibe  10 mg Oral Daily   insulin aspart  0-15 Units Subcutaneous TID WC   insulin aspart  0-5 Units Subcutaneous QHS   insulin glargine-yfgn  10 Units Subcutaneous QHS   loratadine  10 mg Oral Daily   melatonin  4.5 mg Oral QHS   methylPREDNISolone (SOLU-MEDROL) injection  40 mg Intravenous Q12H   metoprolol tartrate  25 mg Oral BID   Rivaroxaban  15 mg Oral Q supper   tamsulosin  0.4  mg Oral QPC supper   Continuous Infusions:  azithromycin 500 mg (10/13/23 0848)   cefTRIAXone (ROCEPHIN)  IV 2 g (10/13/23 0958)     LOS: 1 day    Time spent: 45 minutes spent on chart review, discussion with nursing staff, consultants, updating family and interview/physical exam; more than 50% of that time was spent in counseling and/or coordination of care.    Joseph Art, DO [facility] Available via Epic secure chat 7am-7pm After these hours, please refer to coverage provider listed on [email or web address] 10/13/2023, 10:53 AM

## 2023-10-13 NOTE — Plan of Care (Signed)

## 2023-10-14 ENCOUNTER — Other Ambulatory Visit (HOSPITAL_COMMUNITY): Payer: Self-pay

## 2023-10-14 ENCOUNTER — Ambulatory Visit: Payer: HMO | Admitting: Cardiology

## 2023-10-14 DIAGNOSIS — E1165 Type 2 diabetes mellitus with hyperglycemia: Secondary | ICD-10-CM | POA: Diagnosis not present

## 2023-10-14 DIAGNOSIS — R052 Subacute cough: Secondary | ICD-10-CM | POA: Diagnosis not present

## 2023-10-14 DIAGNOSIS — J189 Pneumonia, unspecified organism: Secondary | ICD-10-CM | POA: Diagnosis not present

## 2023-10-14 LAB — PROCALCITONIN: Procalcitonin: 0.24 ng/mL

## 2023-10-14 LAB — BASIC METABOLIC PANEL
Anion gap: 9 (ref 5–15)
BUN: 37 mg/dL — ABNORMAL HIGH (ref 8–23)
CO2: 22 mmol/L (ref 22–32)
Calcium: 8.6 mg/dL — ABNORMAL LOW (ref 8.9–10.3)
Chloride: 102 mmol/L (ref 98–111)
Creatinine, Ser: 1.57 mg/dL — ABNORMAL HIGH (ref 0.61–1.24)
GFR, Estimated: 45 mL/min — ABNORMAL LOW (ref >60)
Glucose, Bld: 264 mg/dL — ABNORMAL HIGH (ref 70–99)
Potassium: 4.7 mmol/L (ref 3.5–5.1)
Sodium: 133 mmol/L — ABNORMAL LOW (ref 135–145)

## 2023-10-14 LAB — GLUCOSE, CAPILLARY
Glucose-Capillary: 274 mg/dL — ABNORMAL HIGH (ref 70–99)
Glucose-Capillary: 315 mg/dL — ABNORMAL HIGH (ref 70–99)

## 2023-10-14 LAB — CBC
HCT: 42.6 % (ref 39.0–52.0)
Hemoglobin: 14.4 g/dL (ref 13.0–17.0)
MCH: 32.1 pg (ref 26.0–34.0)
MCHC: 33.8 g/dL (ref 30.0–36.0)
MCV: 95.1 fL (ref 80.0–100.0)
Platelets: 206 10*3/uL (ref 150–400)
RBC: 4.48 MIL/uL (ref 4.22–5.81)
RDW: 11.8 % (ref 11.5–15.5)
WBC: 15.6 10*3/uL — ABNORMAL HIGH (ref 4.0–10.5)
nRBC: 0 % (ref 0.0–0.2)

## 2023-10-14 MED ORDER — METFORMIN HCL 500 MG PO TABS: 500.0000 mg | ORAL_TABLET | Freq: Every day | ORAL | 0 refills | Status: DC

## 2023-10-14 MED ORDER — AMOXICILLIN-POT CLAVULANATE 500-125 MG PO TABS: 1.0000 | ORAL_TABLET | Freq: Two times a day (BID) | ORAL | 0 refills | Status: DC

## 2023-10-14 MED ORDER — DM-GUAIFENESIN ER 30-600 MG PO TB12: 1.0000 | ORAL_TABLET | Freq: Two times a day (BID) | ORAL | Status: AC

## 2023-10-14 MED ORDER — BENZONATATE 200 MG PO CAPS: 200.0000 mg | ORAL_CAPSULE | Freq: Three times a day (TID) | ORAL | 0 refills | Status: DC | PRN

## 2023-10-14 MED ORDER — IPRATROPIUM-ALBUTEROL 0.5-2.5 (3) MG/3ML IN SOLN: 3.0000 mL | RESPIRATORY_TRACT | 0 refills | Status: DC | PRN

## 2023-10-14 MED ORDER — PREDNISONE 20 MG PO TABS
40.0000 mg | ORAL_TABLET | Freq: Every day | ORAL | 0 refills | Status: AC
Start: 2023-10-14 — End: 2023-10-17

## 2023-10-14 NOTE — Progress Notes (Signed)
Discharge instructions reviewed with patient and patient's daughter in law.  Both verbalized understanding of instructions. Patient discharged home with family in stable condition.

## 2023-10-14 NOTE — Discharge Summary (Signed)
Physician Discharge Summary  Max Davenport ZOX:[id number] DOB: [birth year] DOA: 10/11/2023  PCP: Benita Stabile, MD  Admit date: 10/11/2023 Discharge date: 10/14/2023  Admitted From: home Discharge disposition: home   Recommendations for Outpatient Follow-Up:   Referral to pulm for follow up of CT scan X ray in 6-8 weeks   Discharge Diagnosis:   Principal Problem:   CAP (community acquired pneumonia) Active Problems:   Persistent atrial fibrillation (HCC)   HTN (hypertension)   HLD (hyperlipidemia)   BPH (benign prostatic hyperplasia)   Uncontrolled type 2 diabetes mellitus with hyperglycemia, without long-term current use of insulin (HCC)   Chronic kidney disease, stage 3b (HCC)    Discharge Condition: Improved.  Diet recommendation: Low sodium, heart healthy.  Carbohydrate-modified  Wound care: None.  Code status: Full.   History of Present Illness:   Max Davenport is a 77 y.o. male with medical history significant of chronic atrial fibrillation on Xarelto, hypertension, hyperlipidemia, chronic kidney disease stage IIIb, type 2 diabetes mellitus with nephropathy, BPH and prior history of skin cancer; who presented to the hospital secondary to coughing spells, short winded sensation with activity and not feeling well.  Patient reports overall symptoms have been present for at least a month prior to admission and worsening. Workup in the emergency department demonstrated multifocal pneumonia on images and a positive COVID PCR.  Further discussions with radiologist suggesting the presence of complicated organizing pneumonia versus pulmonary fibrosis and Boop.   Patient reports some chills but no frank fever.  There has not been any nausea, vomiting, chest pain, palpitations, abdominal pain, focal weaknesses, dysuria, hematuria, melena or hematochezia.  Patient expressed general malaise and URI congestive symptoms.   Prior to admission he has done short course of  his steroids and antibiotics without much improvement.   Bronchodilator management, cultures and initiation of antibiotics provided while in the ED -TRH contacted to place in the hospital for further evaluation and management.   Hospital Course by Problem:   CAP (community acquired pneumonia) with COVID 19 -Patient with multifocal pneumonia appreciated on CT scan -Positive COVID PCR -Empiric antibiotic started and will finish course -Steroids and neb will be provided -outpatient pulmonary referral No need for Home O2 eval   Persistent atrial fibrillation (HCC) -Resuming home rate controlling agents and the use of Xarelto.   Uncontrolled type 2 diabetes mellitus with hyperglycemia, without long-term current use of insulin (HCC) -A1c 8.0 -suspect will be higher while on steroids-- close outpatient follow up (patient does not think he is on the farxiga anymore)    Chronic kidney disease, stage 3b (HCC) -Stable and at baseline -encourage hydration   BPH (benign prostatic hyperplasia) -Continue the use of Flomax.   HLD (hyperlipidemia) -Continue statin.   HTN (hypertension) -Continue current antihypertensive agents         Medical Consultants:      Discharge Exam:   Vitals:   10/14/23 0540 10/14/23 0850  BP: (!) 132/90   Pulse: 92   Resp: 19   Temp: 98 F (36.7 C)   SpO2: 95% 94%   Vitals:   10/13/23 2011 10/13/23 2112 10/14/23 0540 10/14/23 0850  BP:  (!) 160/103 (!) 132/90   Pulse:  93 92   Resp:  (!) 27 19   Temp:  98.7 F (37.1 C) 98 F (36.7 C)   TempSrc:  Oral Oral   SpO2: 96% 97% 95% 94%  Weight:      Height:  General exam: Appears calm and comfortable.    The results of significant diagnostics from this hospitalization (including imaging, microbiology, ancillary and laboratory) are listed below for reference.     Procedures and Diagnostic Studies:   CT Chest W Contrast  Result Date: 10/12/2023 CLINICAL DATA:  Cough, chronic,  persisting greater than 8 weeks, failed empiric treatment EXAM: CT CHEST WITH CONTRAST TECHNIQUE: Multidetector CT imaging of the chest was performed during intravenous contrast administration. RADIATION DOSE REDUCTION: This exam was performed according to the departmental dose-optimization program which includes automated exposure control, adjustment of the mA and/or kV according to patient size and/or use of iterative reconstruction technique. CONTRAST:  75mL OMNIPAQUE IOHEXOL 300 MG/ML  SOLN COMPARISON:  Radiographs earlier today FINDINGS: Cardiovascular: Normal heart size. No pericardial effusion. Coronary artery and aortic atherosclerotic calcification. Mediastinum/Nodes: Trachea and esophagus are unremarkable. Borderline mediastinal and left hilar adenopathy. For example 1.1 cm pretracheal lymph node on 2/54 and 1.1 cm left hilar lymph node on 2/71. Lungs/Pleura: Bilateral peripheral and peribronchovascular predominant consolidation and ground-glass opacities greatest in the left lower lobe. Small cyst with cavitation in the posterior left lower lobe (3/88). No pleural effusion or pneumothorax. Upper Abdomen: No acute abnormality. Musculoskeletal: No acute fracture. IMPRESSION: Multifocal airspace opacities. This may represent multifocal pneumonia. In the setting of chronic symptoms, differential considerations include cryptogenic organizing pneumonia and chronic eosinophilic pneumonia. Aortic Atherosclerosis (ICD10-I70.0). Electronically Signed   By: Minerva Fester M.D.   On: 10/12/2023 03:54   DG Chest 2 View  Result Date: 10/12/2023 CLINICAL DATA:  Cough, weight loss EXAM: CHEST - 2 VIEW COMPARISON:  08/26/2006 FINDINGS: The basilar airspace opacities compatible with pneumonia. Suspected small left pleural effusion. No pneumothorax. Normal cardiomediastinal silhouette. IMPRESSION: Left lower lobe pneumonia. Follow-up in 6-8 weeks after treatment is recommended to ensure resolution. Electronically  Signed   By: Minerva Fester M.D.   On: 10/12/2023 00:19     Labs:   Basic Metabolic Panel: Recent Labs  Lab 10/12/23 0010 10/12/23 1008 10/13/23 0506 10/14/23 0350  NA 134*  --  133* 133*  K 4.9  --  5.0 4.7  CL 99  --  102 102  CO2 26  --  21* 22  GLUCOSE 222*  --  234* 264*  BUN 22  --  26* 37*  CREATININE 1.65*  --  1.56* 1.57*  CALCIUM 8.6*  --  8.4* 8.6*  MG  --  1.9  --   --   PHOS  --  2.5  --   --    GFR Estimated Creatinine Clearance: 43.3 mL/min (A) (by C-G formula based on SCr of 1.57 mg/dL (H)). Liver Function Tests: Recent Labs  Lab 10/12/23 0010  AST 19  ALT 25  ALKPHOS 80  BILITOT 0.9  PROT 6.9  ALBUMIN 2.6*   No results for input(s): "LIPASE", "AMYLASE" in the last 168 hours. No results for input(s): "AMMONIA" in the last 168 hours. Coagulation profile No results for input(s): "INR", "PROTIME" in the last 168 hours.  CBC: Recent Labs  Lab 10/12/23 0010 10/13/23 0506 10/14/23 0350  WBC 8.7 11.0* 15.6*  NEUTROABS 6.8  --   --   HGB 16.6 15.0 14.4  HCT 47.2 42.7 42.6  MCV 93.8 93.0 95.1  PLT 174 194 206   Cardiac Enzymes: No results for input(s): "CKTOTAL", "CKMB", "CKMBINDEX", "TROPONINI" in the last 168 hours. BNP: Invalid input(s): "POCBNP" CBG: Recent Labs  Lab 10/13/23 0756 10/13/23 1200 10/13/23 1618 10/13/23 2111 10/14/23 0732  GLUCAP 266* 195* 241* 277* 274*   D-Dimer No results for input(s): "DDIMER" in the last 72 hours. Hgb A1c Recent Labs    10/12/23 0805  HGBA1C 8.0*   Lipid Profile No results for input(s): "CHOL", "HDL", "LDLCALC", "TRIG", "CHOLHDL", "LDLDIRECT" in the last 72 hours. Thyroid function studies No results for input(s): "TSH", "T4TOTAL", "T3FREE", "THYROIDAB" in the last 72 hours.  Invalid input(s): "FREET3" Anemia work up No results for input(s): "VITAMINB12", "FOLATE", "FERRITIN", "TIBC", "IRON", "RETICCTPCT" in the last 72 hours. Microbiology Recent Results (from the past 240 hour(s))   SARS Coronavirus 2 by RT PCR (hospital order, performed in [facility] hospital lab) *cepheid single result test* Anterior Nasal Swab     Status: Abnormal   Collection Time: 10/12/23  7:11 AM   Specimen: Anterior Nasal Swab  Result Value Ref Range Status   SARS Coronavirus 2 by RT PCR POSITIVE (A) NEGATIVE Final    Comment: (NOTE) SARS-CoV-2 target nucleic acids are DETECTED  SARS-CoV-2 RNA is generally detectable in upper respiratory specimens  during the acute phase of infection.  Positive results are indicative  of the presence of the identified virus, but do not rule out bacterial infection or co-infection with other pathogens not detected by the test.  Clinical correlation with patient history and  other diagnostic information is necessary to determine patient infection status.  The expected result is negative.  Fact Sheet for Patients:   RoadLapTop.co.za   Fact Sheet for Healthcare Providers:   http://kim-miller.com/    This test is not yet approved or cleared by the [place] FDA and  has been authorized for detection and/or diagnosis of SARS-CoV-2 by FDA under an Emergency Use Authorization (EUA).  This EUA will remain in effect (meaning this test can be used) for the duration of  the COVID-19 declaration under Section 564(b)(1)  of the Act, 21 U.S.C. section 360-bbb-3(b)(1), unless the authorization is terminated or revoked sooner.   Performed at [facility], [address], [place], [place] [address]   Respiratory (~20 pathogens) panel by PCR     Status: None   Collection Time: 10/12/23  8:18 AM   Specimen: Nasopharyngeal Swab; Respiratory  Result Value Ref Range Status   Adenovirus NOT DETECTED NOT DETECTED Final   Coronavirus 229E NOT DETECTED NOT DETECTED Final    Comment: (NOTE) The Coronavirus on the Respiratory Panel, DOES NOT test for the novel  Coronavirus (2019 nCoV)    Coronavirus HKU1 NOT DETECTED NOT  DETECTED Final   Coronavirus NL63 NOT DETECTED NOT DETECTED Final   Coronavirus OC43 NOT DETECTED NOT DETECTED Final   Metapneumovirus NOT DETECTED NOT DETECTED Final   Rhinovirus / Enterovirus NOT DETECTED NOT DETECTED Final   Influenza A NOT DETECTED NOT DETECTED Final   Influenza B NOT DETECTED NOT DETECTED Final   Parainfluenza Virus 1 NOT DETECTED NOT DETECTED Final   Parainfluenza Virus 2 NOT DETECTED NOT DETECTED Final   Parainfluenza Virus 3 NOT DETECTED NOT DETECTED Final   Parainfluenza Virus 4 NOT DETECTED NOT DETECTED Final   Respiratory Syncytial Virus NOT DETECTED NOT DETECTED Final   Bordetella pertussis NOT DETECTED NOT DETECTED Final   Bordetella Parapertussis NOT DETECTED NOT DETECTED Final   Chlamydophila pneumoniae NOT DETECTED NOT DETECTED Final   Mycoplasma pneumoniae NOT DETECTED NOT DETECTED Final    Comment: Performed at [facility], [address], [place], [place] [address]     Discharge Instructions:   Discharge Instructions     Ambulatory  referral to Pulmonology   Complete by: As directed    Reason for referral: Cough Comment - see chest CT scan from 10/24   Diet - low sodium heart healthy   Complete by: As directed    Diet Carb Modified   Complete by: As directed    Discharge instructions   Complete by: As directed    Your sugars will be higher than normal while on steroids Stay hydrated by drinking plenty of fluids Use flutter valve   Increase activity slowly   Complete by: As directed       Allergies as of 10/14/2023   No Known Allergies      Medication List     STOP taking these medications    doxycycline 100 MG capsule Commonly known as: MONODOX   Farxiga 10 MG Tabs tablet Generic drug: dapagliflozin propanediol   phenylephrine 10 MG Tabs tablet Commonly known as: SUDAFED PE       TAKE these medications    acetaminophen 500 MG tablet Commonly known as: TYLENOL Take 500 mg by mouth every 6 (six) hours as needed  for mild pain (pain score 1-3).   amoxicillin-clavulanate 500-125 MG tablet Commonly known as: Augmentin Take 1 tablet by mouth 2 (two) times daily.   atorvastatin 20 MG tablet Commonly known as: LIPITOR Take 20 mg by mouth at bedtime.   benzonatate 200 MG capsule Commonly known as: TESSALON Take 1 capsule (200 mg total) by mouth 3 (three) times daily as needed (uncontrolled/refractionary coughing spells).   dextromethorphan-guaiFENesin 30-600 MG 12hr tablet Commonly known as: MUCINEX DM Take 1 tablet by mouth 2 (two) times daily.   ezetimibe 10 MG tablet Commonly known as: ZETIA TAKE 1 TABLET BY MOUTH AT BEDTIME FOR CHOLESTEROL.   ipratropium-albuterol 0.5-2.5 (3) MG/3ML Soln Commonly known as: DUONEB Take 3 mLs by nebulization every 4 (four) hours as needed (SOB/cough).   levocetirizine 5 MG tablet Commonly known as: XYZAL Take 5 mg by mouth at bedtime.   losartan 50 MG tablet Commonly known as: COZAAR Take 50 mg by mouth 2 (two) times daily.   meclizine 25 MG tablet Commonly known as: ANTIVERT Take 25 mg by mouth 3 (three) times daily as needed.   metFORMIN 500 MG tablet Commonly known as: GLUCOPHAGE Take 1 tablet (500 mg total) by mouth daily for 5 days.   methocarbamol 500 MG tablet Commonly known as: ROBAXIN Take 1 tablet (500 mg total) by mouth every 8 (eight) hours as needed for muscle spasms.   metoprolol succinate 25 MG 24 hr tablet Commonly known as: TOPROL-XL TAKE 1/2 TABLET BY MOUTH DAILY.   Mucinex Sinus-Max Clear & Cool 0.05 % nasal spray Generic drug: oxymetazoline Place 1 spray into both nostrils 2 (two) times daily as needed for congestion.   multivitamin tablet Take 1 tablet by mouth daily.   predniSONE 20 MG tablet Commonly known as: DELTASONE Take 2 tablets (40 mg total) by mouth daily for 3 days. What changed:  medication strength how much to take when to take this   PROBIOTIC ACIDOPHILUS PO Take 1 capsule by mouth daily.    Rivaroxaban 15 MG Tabs tablet Commonly known as: XARELTO Take 15 mg by mouth daily with supper.   tamsulosin 0.4 MG Caps capsule Commonly known as: FLOMAX Take 0.4 mg by mouth daily after supper.   traZODone 50 MG tablet Commonly known as: DESYREL Take 50 mg by mouth at bedtime.  Durable Medical Equipment  (From admission, onward)           Start     Ordered   10/14/23 0933  For home use only DME Nebulizer machine  Once       Question Answer Comment  Patient needs a nebulizer to treat with the following condition COVID-19   Length of Need 6 Months      10/14/23 0932              Time coordinating discharge: 45 min  Signed:  Joseph Art DO  [facility] 10/14/2023, 12:01 PM

## 2023-10-14 NOTE — Care Management Important Message (Signed)
Important Message  Patient Details  Name: Max Davenport MRN: [id number] Date of Birth: Sep 20, 1946   Important Message Given:  Yes - Medicare IM     Corey Harold 10/14/2023, 3:32 PM

## 2023-10-14 NOTE — Progress Notes (Signed)
SATURATION QUALIFICATIONS: (This note is used to comply with regulatory documentation for home oxygen)  Patient Saturations on Room Air at Rest = 96%  Patient Saturations on Room Air while Ambulating = 92%  Patient Saturations on  Liters of oxygen while Ambulating = %  Please briefly explain why patient needs home oxygen: 

## 2023-10-14 NOTE — TOC Transition Note (Signed)
Transition of Care [facility]) - CM/SW Discharge Note   Patient Details  Name: RESHARD KITTELSON MRN: [id number] Date of Birth: Mar 11, 1946  Transition of Care [facility]) CM/SW Contact:  Leitha Bleak, RN Phone Number: 10/14/2023, 12:44 PM   Clinical Narrative:   Discharging home, MD ordering a neb machine. [employer] is on site, made aware of the order. They will deliver to the room. RN updated.    Final next level of care: Home/Self Care Barriers to Discharge: Barriers Resolved   Patient Goals and CMS Choice CMS Medicare.gov Compare Post Acute Care list provided to:: Patient Choice offered to / list presented to : Patient  Discharge Placement      Patient and family notified of of transfer: 10/14/23  Discharge Plan and Services Additional resources added to the After Visit Summary for                DME Arranged: Oxygen DME Agency: [employer] Date DME Agency Contacted: 10/14/23 Time DME Agency Contacted: 1234 Representative spoke with at DME Agency: Ian Malkin       Social Determinants of Health (SDOH) Interventions SDOH Screenings   Food Insecurity: No Food Insecurity (10/12/2023)  Housing: Low Risk  (10/12/2023)  Transportation Needs: No Transportation Needs (10/12/2023)  Utilities: Not At Risk (10/12/2023)  Tobacco Use: Low Risk  (10/11/2023)     Readmission Risk Interventions    10/14/2023   12:44 PM  Readmission Risk Prevention Plan  Transportation Screening Complete  PCP or Specialist Appt within 5-7 Days Not Complete  Home Care Screening Complete  Medication Review (RN CM) Complete

## 2023-10-15 ENCOUNTER — Inpatient Hospital Stay: Admission: RE | Admit: 2023-10-15 | Payer: HMO | Source: Ambulatory Visit

## 2023-10-18 ENCOUNTER — Other Ambulatory Visit (HOSPITAL_COMMUNITY): Payer: Self-pay

## 2023-10-19 NOTE — Progress Notes (Deleted)
  Cardiology Office Note:  .   Date:  10/19/2023  ID:  Max Davenport, DOB [birth year], MRN [id number] PCP: Benita Stabile, MD  [facility] Providers Cardiologist:  Nona Dell, MD { Click to update primary MD,subspecialty MD or APP then REFRESH:1}   History of Present Illness: .   Max Davenport is a 77 y.o. male with history of chronic dizzines/unsteadiness, permanent Afib on Xarelto, HTN.  Patient saw Dr. Diona Browner 08/2023 with exertional fatigue-Lexi ordered and was normal. Chronic dizziness, unsteady-wasn't orthostatic, PT and vestibular exercises didn't improve symptoms.asked to f/u with PCP.  ROS: ***  Studies Reviewed: Marland Kitchen         Prior CV Studies: {Select studies to display:26339}  NST 09/2023   The study is normal. Findings are consistent with no ischemia. The study is low risk.   No ST deviation was noted. The ECG was negative for ischemia.   LV perfusion is normal.  No significant myocardial perfusion defects to indicate scar or ischemia.   Left ventricular function is normal. Nuclear stress EF: 61%.   Low risk study with no significant ischemia and LVEF 61%.  Risk Assessment/Calculations:   {Does this patient have ATRIAL FIBRILLATION?:[id number]} No BP recorded.  {Refresh Note OR Click here to enter BP  :1}***       Physical Exam:   VS:  There were no vitals taken for this visit.   Wt Readings from Last 3 Encounters:  10/12/23 194 lb 10.7 oz (88.3 kg)  09/09/23 211 lb 12.8 oz (96.1 kg)  07/05/23 218 lb (98.9 kg)    GEN: Well nourished, well developed in no acute distress NECK: No JVD; No carotid bruits CARDIAC: ***RRR, no murmurs, rubs, gallops RESPIRATORY:  Clear to auscultation without rales, wheezing or rhonchi  ABDOMEN: Soft, non-tender, non-distended EXTREMITIES:  No edema; No deformity   ASSESSMENT AND PLAN: .   Exertional fatigue-NST normal 09/2023  Chronic dizziness/unsteadiness not orthostatic or felt to be cardiac  Permanent Afib  controlled  HTN on cozaar.     {Are you ordering a CV Procedure (e.g. stress test, cath, DCCV, TEE, etc)?   Press F2        :[id number]}  Dispo: ***  Signed, Jacolyn Reedy, PA-C

## 2023-10-26 DIAGNOSIS — E1165 Type 2 diabetes mellitus with hyperglycemia: Secondary | ICD-10-CM | POA: Diagnosis not present

## 2023-10-26 DIAGNOSIS — R0989 Other specified symptoms and signs involving the circulatory and respiratory systems: Secondary | ICD-10-CM | POA: Diagnosis not present

## 2023-10-26 DIAGNOSIS — D751 Secondary polycythemia: Secondary | ICD-10-CM | POA: Diagnosis not present

## 2023-10-26 DIAGNOSIS — E782 Mixed hyperlipidemia: Secondary | ICD-10-CM | POA: Diagnosis not present

## 2023-10-26 DIAGNOSIS — R5383 Other fatigue: Secondary | ICD-10-CM | POA: Diagnosis not present

## 2023-10-26 DIAGNOSIS — R809 Proteinuria, unspecified: Secondary | ICD-10-CM | POA: Diagnosis not present

## 2023-10-26 DIAGNOSIS — R609 Edema, unspecified: Secondary | ICD-10-CM | POA: Insufficient documentation

## 2023-10-26 DIAGNOSIS — J189 Pneumonia, unspecified organism: Secondary | ICD-10-CM | POA: Diagnosis not present

## 2023-10-26 DIAGNOSIS — E1169 Type 2 diabetes mellitus with other specified complication: Secondary | ICD-10-CM | POA: Diagnosis not present

## 2023-10-26 DIAGNOSIS — N189 Chronic kidney disease, unspecified: Secondary | ICD-10-CM | POA: Diagnosis not present

## 2023-10-26 DIAGNOSIS — I129 Hypertensive chronic kidney disease with stage 1 through stage 4 chronic kidney disease, or unspecified chronic kidney disease: Secondary | ICD-10-CM | POA: Diagnosis not present

## 2023-10-26 DIAGNOSIS — I4891 Unspecified atrial fibrillation: Secondary | ICD-10-CM | POA: Diagnosis not present

## 2023-10-27 ENCOUNTER — Ambulatory Visit
Admission: RE | Admit: 2023-10-27 | Discharge: 2023-10-27 | Disposition: A | Discharge status: Home / Self Care | Payer: HMO | Source: Ambulatory Visit | Attending: Student | Admitting: Student

## 2023-10-27 DIAGNOSIS — R42 Dizziness and giddiness: Secondary | ICD-10-CM | POA: Diagnosis not present

## 2023-10-27 DIAGNOSIS — R410 Disorientation, unspecified: Secondary | ICD-10-CM | POA: Diagnosis not present

## 2023-10-27 MED ORDER — IOPAMIDOL (ISOVUE-300) INJECTION 61%
500.0000 mL | Freq: Once | INTRAVENOUS | Status: AC | PRN
  Administered 2023-10-27: 75 mL via INTRAVENOUS

## 2023-10-29 DIAGNOSIS — J189 Pneumonia, unspecified organism: Secondary | ICD-10-CM | POA: Diagnosis not present

## 2023-10-31 ENCOUNTER — Inpatient Hospital Stay (HOSPITAL_COMMUNITY)
Admission: EM | Admit: 2023-10-31 | Discharge: 2023-11-04 | DRG: 378 | Disposition: A | Discharge status: Home / Self Care | Payer: HMO | Attending: Internal Medicine | Admitting: Internal Medicine

## 2023-10-31 ENCOUNTER — Encounter (HOSPITAL_COMMUNITY): Payer: Self-pay

## 2023-10-31 ENCOUNTER — Other Ambulatory Visit: Payer: Self-pay

## 2023-10-31 DIAGNOSIS — K5731 Diverticulosis of large intestine without perforation or abscess with bleeding: Secondary | ICD-10-CM | POA: Diagnosis not present

## 2023-10-31 DIAGNOSIS — D509 Iron deficiency anemia, unspecified: Secondary | ICD-10-CM

## 2023-10-31 DIAGNOSIS — K449 Diaphragmatic hernia without obstruction or gangrene: Secondary | ICD-10-CM | POA: Diagnosis present

## 2023-10-31 DIAGNOSIS — K5730 Diverticulosis of large intestine without perforation or abscess without bleeding: Secondary | ICD-10-CM | POA: Diagnosis not present

## 2023-10-31 DIAGNOSIS — Z7984 Long term (current) use of oral hypoglycemic drugs: Secondary | ICD-10-CM

## 2023-10-31 DIAGNOSIS — I10 Essential (primary) hypertension: Secondary | ICD-10-CM | POA: Diagnosis not present

## 2023-10-31 DIAGNOSIS — I4891 Unspecified atrial fibrillation: Secondary | ICD-10-CM | POA: Diagnosis present

## 2023-10-31 DIAGNOSIS — E440 Moderate protein-calorie malnutrition: Secondary | ICD-10-CM | POA: Diagnosis not present

## 2023-10-31 DIAGNOSIS — I4821 Permanent atrial fibrillation: Secondary | ICD-10-CM | POA: Diagnosis not present

## 2023-10-31 DIAGNOSIS — K2270 Barrett's esophagus without dysplasia: Secondary | ICD-10-CM | POA: Diagnosis not present

## 2023-10-31 DIAGNOSIS — E782 Mixed hyperlipidemia: Secondary | ICD-10-CM | POA: Diagnosis present

## 2023-10-31 DIAGNOSIS — Z8616 Personal history of COVID-19: Secondary | ICD-10-CM | POA: Diagnosis not present

## 2023-10-31 DIAGNOSIS — K635 Polyp of colon: Secondary | ICD-10-CM | POA: Diagnosis not present

## 2023-10-31 DIAGNOSIS — D62 Acute posthemorrhagic anemia: Secondary | ICD-10-CM | POA: Diagnosis not present

## 2023-10-31 DIAGNOSIS — K922 Gastrointestinal hemorrhage, unspecified: Secondary | ICD-10-CM | POA: Diagnosis not present

## 2023-10-31 DIAGNOSIS — Z7901 Long term (current) use of anticoagulants: Secondary | ICD-10-CM

## 2023-10-31 DIAGNOSIS — E1165 Type 2 diabetes mellitus with hyperglycemia: Secondary | ICD-10-CM | POA: Diagnosis present

## 2023-10-31 DIAGNOSIS — I129 Hypertensive chronic kidney disease with stage 1 through stage 4 chronic kidney disease, or unspecified chronic kidney disease: Secondary | ICD-10-CM | POA: Diagnosis not present

## 2023-10-31 DIAGNOSIS — Z981 Arthrodesis status: Secondary | ICD-10-CM | POA: Diagnosis not present

## 2023-10-31 DIAGNOSIS — Z85828 Personal history of other malignant neoplasm of skin: Secondary | ICD-10-CM

## 2023-10-31 DIAGNOSIS — D649 Anemia, unspecified: Secondary | ICD-10-CM | POA: Diagnosis not present

## 2023-10-31 DIAGNOSIS — D124 Benign neoplasm of descending colon: Secondary | ICD-10-CM | POA: Diagnosis not present

## 2023-10-31 DIAGNOSIS — D500 Iron deficiency anemia secondary to blood loss (chronic): Secondary | ICD-10-CM | POA: Diagnosis not present

## 2023-10-31 DIAGNOSIS — K921 Melena: Secondary | ICD-10-CM | POA: Diagnosis not present

## 2023-10-31 DIAGNOSIS — D123 Benign neoplasm of transverse colon: Secondary | ICD-10-CM | POA: Diagnosis not present

## 2023-10-31 DIAGNOSIS — N189 Chronic kidney disease, unspecified: Secondary | ICD-10-CM | POA: Diagnosis not present

## 2023-10-31 DIAGNOSIS — N1832 Chronic kidney disease, stage 3b: Secondary | ICD-10-CM | POA: Diagnosis not present

## 2023-10-31 DIAGNOSIS — K625 Hemorrhage of anus and rectum: Secondary | ICD-10-CM | POA: Diagnosis not present

## 2023-10-31 DIAGNOSIS — E1122 Type 2 diabetes mellitus with diabetic chronic kidney disease: Secondary | ICD-10-CM | POA: Diagnosis present

## 2023-10-31 DIAGNOSIS — R0981 Nasal congestion: Secondary | ICD-10-CM | POA: Diagnosis not present

## 2023-10-31 DIAGNOSIS — I4819 Other persistent atrial fibrillation: Secondary | ICD-10-CM | POA: Diagnosis not present

## 2023-10-31 DIAGNOSIS — Z6828 Body mass index (BMI) 28.0-28.9, adult: Secondary | ICD-10-CM | POA: Diagnosis not present

## 2023-10-31 DIAGNOSIS — N400 Enlarged prostate without lower urinary tract symptoms: Secondary | ICD-10-CM | POA: Diagnosis not present

## 2023-10-31 DIAGNOSIS — R600 Localized edema: Secondary | ICD-10-CM | POA: Diagnosis not present

## 2023-10-31 DIAGNOSIS — Z79899 Other long term (current) drug therapy: Secondary | ICD-10-CM

## 2023-10-31 HISTORY — DX: Cardiac arrhythmia, unspecified: I49.9

## 2023-10-31 LAB — COMPREHENSIVE METABOLIC PANEL
ALT: 26 U/L (ref 0–44)
AST: 25 U/L (ref 15–41)
Albumin: 3 g/dL — ABNORMAL LOW (ref 3.5–5.0)
Alkaline Phosphatase: 60 U/L (ref 38–126)
Anion gap: 9 (ref 5–15)
BUN: 22 mg/dL (ref 8–23)
CO2: 24 mmol/L (ref 22–32)
Calcium: 8.2 mg/dL — ABNORMAL LOW (ref 8.9–10.3)
Chloride: 105 mmol/L (ref 98–111)
Creatinine, Ser: 1.63 mg/dL — ABNORMAL HIGH (ref 0.61–1.24)
GFR, Estimated: 43 mL/min — ABNORMAL LOW (ref >60)
Glucose, Bld: 220 mg/dL — ABNORMAL HIGH (ref 70–99)
Potassium: 4.5 mmol/L (ref 3.5–5.1)
Sodium: 138 mmol/L (ref 135–145)
Total Bilirubin: 0.4 mg/dL (ref <1.2)
Total Protein: 5.9 g/dL — ABNORMAL LOW (ref 6.5–8.1)

## 2023-10-31 LAB — GLUCOSE, CAPILLARY
Glucose-Capillary: 206 mg/dL — ABNORMAL HIGH (ref 70–99)
Glucose-Capillary: 97 mg/dL (ref 70–99)

## 2023-10-31 LAB — CBC WITH DIFFERENTIAL/PLATELET
Abs Immature Granulocytes: 0.06 10*3/uL (ref 0.00–0.07)
Basophils Absolute: 0 10*3/uL (ref 0.0–0.1)
Basophils Relative: 0 %
Eosinophils Absolute: 0 10*3/uL (ref 0.0–0.5)
Eosinophils Relative: 0 %
HCT: 26.3 % — ABNORMAL LOW (ref 39.0–52.0)
Hemoglobin: 8.6 g/dL — ABNORMAL LOW (ref 13.0–17.0)
Immature Granulocytes: 1 %
Lymphocytes Relative: 12 %
Lymphs Abs: 0.7 10*3/uL (ref 0.7–4.0)
MCH: 34.1 pg — ABNORMAL HIGH (ref 26.0–34.0)
MCHC: 32.7 g/dL (ref 30.0–36.0)
MCV: 104.4 fL — ABNORMAL HIGH (ref 80.0–100.0)
Monocytes Absolute: 0.3 10*3/uL (ref 0.1–1.0)
Monocytes Relative: 5 %
Neutro Abs: 4.5 10*3/uL (ref 1.7–7.7)
Neutrophils Relative %: 82 %
Platelets: 214 10*3/uL (ref 150–400)
RBC: 2.52 MIL/uL — ABNORMAL LOW (ref 4.22–5.81)
RDW: 16.8 % — ABNORMAL HIGH (ref 11.5–15.5)
WBC: 5.6 10*3/uL (ref 4.0–10.5)
nRBC: 0.4 % — ABNORMAL HIGH (ref 0.0–0.2)

## 2023-10-31 LAB — TYPE AND SCREEN
ABO/RH(D): O POS
Antibody Screen: NEGATIVE

## 2023-10-31 LAB — PROTIME-INR
INR: 1 (ref 0.8–1.2)
Prothrombin Time: 13.6 s (ref 11.4–15.2)

## 2023-10-31 LAB — POC OCCULT BLOOD, ED: Fecal Occult Bld: POSITIVE — AB

## 2023-10-31 MED ORDER — EZETIMIBE 10 MG PO TABS
10.0000 mg | ORAL_TABLET | Freq: Every day | ORAL | Status: DC
  Administered 2023-10-31 – 2023-11-03 (×4): 10 mg via ORAL
  Filled 2023-10-31 (×4): qty 1

## 2023-10-31 MED ORDER — ACETAMINOPHEN 325 MG PO TABS
650.0000 mg | ORAL_TABLET | Freq: Four times a day (QID) | ORAL | Status: DC | PRN
  Filled 2023-10-31: qty 2

## 2023-10-31 MED ORDER — MECLIZINE HCL 12.5 MG PO TABS: 25.0000 mg | ORAL_TABLET | Freq: Three times a day (TID) | ORAL | Status: DC | PRN

## 2023-10-31 MED ORDER — PANTOPRAZOLE SODIUM 40 MG IV SOLR
40.0000 mg | Freq: Two times a day (BID) | INTRAVENOUS | Status: DC
  Administered 2023-10-31 – 2023-11-04 (×8): 40 mg via INTRAVENOUS
  Filled 2023-10-31 (×8): qty 10

## 2023-10-31 MED ORDER — MELATONIN 3 MG PO TABS
4.5000 mg | ORAL_TABLET | Freq: Every day | ORAL | Status: DC
  Administered 2023-10-31 – 2023-11-03 (×4): 4.5 mg via ORAL
  Filled 2023-10-31 (×4): qty 2

## 2023-10-31 MED ORDER — ACETAMINOPHEN 650 MG RE SUPP: 650.0000 mg | Freq: Four times a day (QID) | RECTAL | Status: DC | PRN

## 2023-10-31 MED ORDER — GUAIFENESIN-DM 100-10 MG/5ML PO SYRP
15.0000 mL | ORAL_SOLUTION | Freq: Three times a day (TID) | ORAL | Status: DC | PRN
  Administered 2023-10-31 – 2023-11-01 (×2): 15 mL via ORAL
  Filled 2023-10-31 (×2): qty 15

## 2023-10-31 MED ORDER — LORATADINE 10 MG PO TABS
10.0000 mg | ORAL_TABLET | Freq: Every day | ORAL | Status: DC
  Administered 2023-10-31 – 2023-11-03 (×4): 10 mg via ORAL
  Filled 2023-10-31 (×4): qty 1

## 2023-10-31 MED ORDER — ADULT MULTIVITAMIN W/MINERALS CH
1.0000 | ORAL_TABLET | Freq: Every day | ORAL | Status: DC
  Administered 2023-10-31 – 2023-11-04 (×4): 1 via ORAL
  Filled 2023-10-31 (×4): qty 1

## 2023-10-31 MED ORDER — INSULIN ASPART 100 UNIT/ML IJ SOLN
0.0000 [IU] | Freq: Three times a day (TID) | INTRAMUSCULAR | Status: DC
  Administered 2023-10-31: 3 [IU] via SUBCUTANEOUS
  Administered 2023-11-01: 5 [IU] via SUBCUTANEOUS
  Administered 2023-11-02 – 2023-11-04 (×3): 1 [IU] via SUBCUTANEOUS

## 2023-10-31 MED ORDER — METOPROLOL SUCCINATE ER 25 MG PO TB24
12.5000 mg | ORAL_TABLET | Freq: Every day | ORAL | Status: DC
  Administered 2023-10-31 – 2023-11-04 (×5): 12.5 mg via ORAL
  Filled 2023-10-31 (×5): qty 1

## 2023-10-31 MED ORDER — ATORVASTATIN CALCIUM 20 MG PO TABS
20.0000 mg | ORAL_TABLET | Freq: Every day | ORAL | Status: DC
  Administered 2023-10-31 – 2023-11-03 (×4): 20 mg via ORAL
  Filled 2023-10-31 (×4): qty 1

## 2023-10-31 MED ORDER — BOOST / RESOURCE BREEZE PO LIQD CUSTOM
1.0000 | Freq: Three times a day (TID) | ORAL | Status: DC
  Administered 2023-10-31: 1 via ORAL
  Administered 2023-10-31: 237 mL via ORAL
  Administered 2023-11-01 – 2023-11-03 (×5): 1 via ORAL

## 2023-10-31 MED ORDER — TRAZODONE HCL 50 MG PO TABS
50.0000 mg | ORAL_TABLET | Freq: Every day | ORAL | Status: DC
  Administered 2023-10-31 – 2023-11-03 (×4): 50 mg via ORAL
  Filled 2023-10-31 (×4): qty 1

## 2023-10-31 MED ORDER — ONDANSETRON HCL 4 MG PO TABS: 4.0000 mg | ORAL_TABLET | Freq: Four times a day (QID) | ORAL | Status: DC | PRN

## 2023-10-31 MED ORDER — ONDANSETRON HCL 4 MG/2ML IJ SOLN: 4.0000 mg | Freq: Four times a day (QID) | INTRAMUSCULAR | Status: DC | PRN

## 2023-10-31 MED ORDER — PANTOPRAZOLE SODIUM 40 MG IV SOLR
40.0000 mg | INTRAVENOUS | Status: AC
  Administered 2023-10-31 (×2): 40 mg via INTRAVENOUS
  Filled 2023-10-31: qty 10

## 2023-10-31 MED ORDER — LEVOCETIRIZINE DIHYDROCHLORIDE 5 MG PO TABS: 5.0000 mg | ORAL_TABLET | Freq: Every day | ORAL | Status: DC

## 2023-10-31 MED ORDER — IPRATROPIUM-ALBUTEROL 0.5-2.5 (3) MG/3ML IN SOLN
3.0000 mL | RESPIRATORY_TRACT | Status: DC | PRN
  Administered 2023-11-01: 3 mL via RESPIRATORY_TRACT
  Filled 2023-10-31: qty 3

## 2023-10-31 MED ORDER — TAMSULOSIN HCL 0.4 MG PO CAPS
0.4000 mg | ORAL_CAPSULE | Freq: Every day | ORAL | Status: DC
  Administered 2023-10-31 – 2023-11-03 (×4): 0.4 mg via ORAL
  Filled 2023-10-31 (×4): qty 1

## 2023-10-31 NOTE — ED Notes (Signed)
Per EDP, still wanted pt to received protonix 80 bolus even though the order said it had expired

## 2023-10-31 NOTE — ED Notes (Signed)
Pt does state he has has dark, tarry stools lately and he has also been taking stool softeners as well

## 2023-10-31 NOTE — Hospital Course (Signed)
77 year old male with a history of permanent atrial fibrillation on Xarelto, hypertension, hyperlipidemia, CKD stage III, diabetes mellitus type 2, BPH, basal cell skin cancer, and chronic dizziness presenting with malaise and generalized weakness for the past week.  The patient was recently mated to the hospital from 10/03/2023 to 10/14/2023 at which time the patient was treated for pneumonia and COVID-19.  He was discharged home on antibiotics and steroids and nebulizers.  He states that his breathing has gradually improved since the time of discharge.  However he continues to have malaise and some dizziness.  He has followed up with his PCP.  Routine blood work on 10/26/2023 showed hemoglobin 9.8.  He had a repeat CBC on 10/29/2023 and his hemoglobin dropped to 9.0.  Notably, the patient had a hemoglobin of 14.4 on the day of discharge on 10/14/2023. The patient states that he has been having melanotic stool on a daily basis.  He did have 1 episode of hematochezia when he was straining to have a bowel movement.  He denies any NSAIDs.  He denies any alcohol use.  Besides antibiotics and nebulizer treatments, and steroids, there is no new medications.  He denies any fevers, chills, chest pain, cough, hemoptysis, headache, neck pain, abdominal pain, hematemesis, hematuria.  He has had some shortness of breath and dyspnea on exertion. In the ED, the patient was afebrile and hemodynamically stable with oxygen saturation 97% room air.  WBC 4.6, hemoglobin 8.6, platelets 214.  Sodium 130, potassium 4.5, bicarb 24, serum creatinine 1.63.  LFTs were unremarkable.  FOBT was positive.  GI was consulted and recommended admission for further evaluation and treatment.

## 2023-10-31 NOTE — ED Triage Notes (Signed)
Pt has been taking stool softeners lately. Had blood work done at PCP Tues 10/15 and Hgb was 19. This past Tuesday it was 9.8. it was repeated on Friday and was 9. Pt was here 10/28 with pneumonia and Covid and as D/C on the 31st. Doe stake Xarelto, pre-diabatic Hx of HTN and high cholesterol

## 2023-10-31 NOTE — Plan of Care (Signed)

## 2023-10-31 NOTE — Consult Note (Signed)
Consulting  Provider: Dr. Rubin Payor Primary Care Physician:  Benita Stabile, MD Primary Gastroenterologist: Previously unassigned  Reason for Consultation: GI bleed, likely blood loss anemia  HPI:  Max Davenport is a 77 y.o. male with a past medical history of atrial fibrillation chronically on Xarelto, hypertension, dyslipidemia, CKD, diabetes, BPH, who presented to [facility], ER with generalized weakness/fatigue as well as melena.  Patient reports his symptoms started during hospitalization for pneumonia due to COVID (discharged 10/14/2023).  Notes numerous episodes of dark black stools.  He has had intermittent bright red blood in his bowels as well.  Denies any abdominal pain.  No acid reflux.  No dysphagia odynophagia.  Denies any chronic NSAID use.  Does chronically take Xarelto, last dose yesterday evening.  Denies any history of peptic ulcer disease that he is aware of.  No alcohol use.  No previous upper endoscopy.  States he had a colonoscopy greater than 10 years ago.  Unsure of family history as he is adopted.  In the ER found to have hemoglobin 8.6, was 14.4 on 10/14/2023.  Hemoccult positive.  Started on IV Protonix.  Past Medical History:  Diagnosis Date   Atrial fibrillation (HCC)    Essential hypertension    Hyperlipidemia    Skin cancer, basal cell    Type 2 diabetes mellitus (HCC)     Past Surgical History:  Procedure Laterality Date   BASAL CELL CARCINOMA EXCISION     Top of head   INGUINAL HERNIA REPAIR Right    LUMBAR FUSION  2008   NASAL RECONSTRUCTION     WISDOM TOOTH EXTRACTION      Prior to Admission medications   Medication Sig Start Date End Date Taking? Authorizing Provider  acetaminophen (TYLENOL) 500 MG tablet Take 500 mg by mouth every 6 (six) hours as needed for mild pain (pain score 1-3).    [provider]  amoxicillin-clavulanate (AUGMENTIN) 500-125 MG tablet Take 1 tablet by mouth 2 (two) times daily. 10/14/23   Joseph Art, DO   atorvastatin (LIPITOR) 20 MG tablet Take 20 mg by mouth at bedtime.    [provider]  benzonatate (TESSALON) 200 MG capsule Take 1 capsule (200 mg total) by mouth 3 (three) times daily as needed (uncontrolled/refractionary coughing spells). 10/14/23   Joseph Art, DO  dextromethorphan-guaiFENesin (MUCINEX DM) 30-600 MG 12hr tablet Take 1 tablet by mouth 2 (two) times daily. 10/14/23   Joseph Art, DO  ezetimibe (ZETIA) 10 MG tablet TAKE 1 TABLET BY MOUTH AT BEDTIME FOR CHOLESTEROL. 03/08/23   Strader, Lennart Pall, PA-C  ipratropium-albuterol (DUONEB) 0.5-2.5 (3) MG/3ML SOLN Take 3 mLs by nebulization every 4 (four) hours as needed (SOB/cough). 10/14/23   Joseph Art, DO  Lactobacillus (PROBIOTIC ACIDOPHILUS PO) Take 1 capsule by mouth daily.    [provider]  levocetirizine (XYZAL) 5 MG tablet Take 5 mg by mouth at bedtime.    [provider]  losartan (COZAAR) 50 MG tablet Take 50 mg by mouth 2 (two) times daily.    [provider]  meclizine (ANTIVERT) 25 MG tablet Take 25 mg by mouth 3 (three) times daily as needed. 01/13/22   [provider]  metFORMIN (GLUCOPHAGE) 500 MG tablet Take 1 tablet (500 mg total) by mouth daily for 5 days. 10/14/23 10/19/23  Joseph Art, DO  methocarbamol (ROBAXIN) 500 MG tablet Take 1 tablet (500 mg total) by mouth every 8 (eight) hours as needed for muscle spasms. 07/07/23  Magnant, Jette Lewan L, PA-C  metoprolol succinate (TOPROL-XL) 25 MG 24 hr tablet TAKE 1/2 TABLET BY MOUTH DAILY. 07/29/23   Jonelle Sidle, MD  Multiple Vitamin (MULTIVITAMIN) tablet Take 1 tablet by mouth daily.    [provider]  oxymetazoline (MUCINEX SINUS-MAX CLEAR & COOL) 0.05 % nasal spray Place 1 spray into both nostrils 2 (two) times daily as needed for congestion.    [provider]  Rivaroxaban (XARELTO) 15 MG TABS tablet Take 15 mg by mouth daily with supper.    [provider]  tamsulosin  (FLOMAX) 0.4 MG CAPS capsule Take 0.4 mg by mouth daily after supper.    [provider]  traZODone (DESYREL) 50 MG tablet Take 50 mg by mouth at bedtime. 08/02/23   [provider]    Current Facility-Administered Medications  Medication Dose Route Frequency Provider Last Rate Last Admin   acetaminophen (TYLENOL) tablet 650 mg  650 mg Oral Q6H PRN Tat, Onalee Hua, MD       Or   acetaminophen (TYLENOL) suppository 650 mg  650 mg Rectal Q6H PRN Tat, Onalee Hua, MD       atorvastatin (LIPITOR) tablet 20 mg  20 mg Oral QHS Tat, Onalee Hua, MD       ezetimibe (ZETIA) tablet 10 mg  10 mg Oral Daily Tat, David, MD       feeding supplement (BOOST / RESOURCE BREEZE) liquid 1 Container  1 Container Oral TID BM Tat, David, MD       insulin aspart (novoLOG) injection 0-9 Units  0-9 Units Subcutaneous TID WC Tat, David, MD       ipratropium-albuterol (DUONEB) 0.5-2.5 (3) MG/3ML nebulizer solution 3 mL  3 mL Nebulization Q4H PRN Tat, Onalee Hua, MD       loratadine (CLARITIN) tablet 10 mg  10 mg Oral QHS Catarina Hartshorn, MD       meclizine (ANTIVERT) tablet 25 mg  25 mg Oral TID PRN Tat, Onalee Hua, MD       melatonin tablet 4.5 mg  4.5 mg Oral QHS Tat, Onalee Hua, MD       metoprolol succinate (TOPROL-XL) 24 hr tablet 12.5 mg  12.5 mg Oral Daily Tat, David, MD       multivitamin with minerals tablet 1 tablet  1 tablet Oral Daily Tat, David, MD       ondansetron [facility]) tablet 4 mg  4 mg Oral Q6H PRN Tat, David, MD       Or   ondansetron (ZOFRAN) injection 4 mg  4 mg Intravenous Q6H PRN Tat, Onalee Hua, MD       pantoprazole (PROTONIX) injection 40 mg  40 mg Intravenous Q12H Tat, Onalee Hua, MD       tamsulosin (FLOMAX) capsule 0.4 mg  0.4 mg Oral QPC supper Tat, David, MD       traZODone (DESYREL) tablet 50 mg  50 mg Oral Maura Crandall, MD        Allergies as of 10/31/2023 - Review Complete 10/31/2023  Allergen Reaction Noted   Ativan [lorazepam] Other (See Comments) 10/31/2023    Family History  Adopted: Yes     Social History   Socioeconomic History   Marital status: Single    Spouse name: Not on file   Number of children: Not on file   Years of education: Not on file   Highest education level: Not on file  Occupational History   Not on file  Tobacco Use   Smoking status: Never   Smokeless tobacco:  Never  Vaping Use   Vaping status: Never Used  Substance and Sexual Activity   Alcohol use: Never   Drug use: Never   Sexual activity: Not on file  Other Topics Concern   Not on file  Social History Narrative   Not on file   Social Determinants of Health   Financial Resource Strain: Not on file  Food Insecurity: No Food Insecurity (10/31/2023)   Hunger Vital Sign    Worried About Running Out of Food in the Last Year: Never true    Ran Out of Food in the Last Year: Never true  Transportation Needs: No Transportation Needs (10/31/2023)   PRAPARE - Administrator, Civil Service (Medical): No    Lack of Transportation (Non-Medical): No  Physical Activity: Not on file  Stress: Not on file  Social Connections: Not on file  Intimate Partner Violence: Not At Risk (10/31/2023)   Humiliation, Afraid, Rape, and Kick questionnaire    Fear of Current or Ex-Partner: No    Emotionally Abused: No    Physically Abused: No    Sexually Abused: No    Review of Systems: General: Negative for anorexia, weight loss, fever, chills, fatigue, weakness. Eyes: Negative for vision changes.  ENT: Negative for hoarseness, difficulty swallowing , nasal congestion. CV: Negative for chest pain, angina, palpitations, dyspnea on exertion, peripheral edema.  Respiratory: Negative for dyspnea at rest, dyspnea on exertion, cough, sputum, wheezing.  GI: See history of present illness. GU:  Negative for dysuria, hematuria, urinary incontinence, urinary frequency, nocturnal urination.  MS: Negative for joint pain, low back pain.  Derm: Negative for rash or itching.  Neuro: Negative for weakness,  abnormal sensation, seizure, frequent headaches, memory loss, confusion.  Psych: Negative for anxiety, depression Endo: Negative for unusual weight change.  Heme: Negative for bruising or bleeding. Allergy: Negative for rash or hives.  Physical Exam: Vital signs in last 24 hours: Temp:  [98 F (36.7 C)-98.3 F (36.8 C)] 98 F (36.7 C) (11/17 1208) Pulse Rate:  [96-100] 98 (11/17 1100) Resp:  [17-18] 17 (11/17 1208) BP: (120-148)/(70-87) 148/70 (11/17 1208) SpO2:  [97 %-100 %] 100 % (11/17 1208) Weight:  [88 kg] 88 kg (11/17 0841) Last BM Date : 10/31/23 General:   Alert,  Well-developed, well-nourished, pleasant and cooperative in NAD Head:  Normocephalic and atraumatic. Eyes:  Sclera clear, no icterus.   Conjunctiva pink. Ears:  Normal auditory acuity. Nose:  No deformity, discharge,  or lesions. Mouth:  No deformity or lesions, dentition normal. Neck:  Supple; no masses or thyromegaly. Lungs:  Clear throughout to auscultation.   No wheezes, crackles, or rhonchi. No acute distress. Heart:  Regular rate and rhythm; no murmurs, clicks, rubs,  or gallops. Abdomen:  Soft, nontender and nondistended. No masses, hepatosplenomegaly or hernias noted. Normal bowel sounds, without guarding, and without rebound.   Msk:  Symmetrical without gross deformities. Normal posture. Pulses:  Normal pulses noted. Extremities:  Without clubbing or edema. Neurologic:  Alert and  oriented x4;  grossly normal neurologically. Skin:  Intact without significant lesions or rashes. Cervical Nodes:  No significant cervical adenopathy. Psych:  Alert and cooperative. Normal mood and affect.  Intake/Output from previous day: No intake/output data recorded. Intake/Output this shift: No intake/output data recorded.  Lab Results: Recent Labs    10/31/23 0951  WBC 5.6  HGB 8.6*  HCT 26.3*  PLT 214   BMET Recent Labs    10/31/23 0951  NA 138  K 4.5  CL 105  CO2 24  GLUCOSE 220*  BUN 22   CREATININE 1.63*  CALCIUM 8.2*   LFT Recent Labs    10/31/23 0951  PROT 5.9*  ALBUMIN 3.0*  AST 25  ALT 26  ALKPHOS 60  BILITOT 0.4   PT/INR Recent Labs    10/31/23 0951  LABPROT 13.6  INR 1.0   Hepatitis Panel No results for input(s): "HEPBSAG", "HCVAB", "HEPAIGM", "HEPBIGM" in the last 72 hours. C-Diff No results for input(s): "CDIFFTOX" in the last 72 hours.  Studies/Results: No results found.  Assessment: *Acute GI bleeding-melena and hematochezia *Acute blood loss anemia *Chronic systemic anticoagulation with Xarelto  Plan: Etiology of patient's GI bleeding unclear.  Appears to be more upper GI related though does note mixed with bright red blood as well.  No colonoscopy in over 10 years.  I think it would be prudent to further evaluate both his upper and lower GI tracts.  We will tentatively plan on EGD and colonoscopy 11/02/2023 after adequate Xarelto washout.  May have to expedite evaluation if has evidence of significant bleeding prior.  The risks including infection, bleed, or perforation as well as benefits, limitations, alternatives and imponderables have been reviewed with the patient. Potential for esophageal dilation, biopsy, etc. have also been reviewed.  Questions have been answered. All parties agreeable.  Agree with IV Protonix twice daily.  Continue to monitor H&H and transfuse for less than 7.  Clear liquid diet.  Hold Xarelto.  GI to continue to follow.   Hennie Duos. Marletta Lor, D.O. Gastroenterology and Hepatology [facility]    LOS: 0 days     10/31/2023, 12:44 PM

## 2023-10-31 NOTE — H&P (Signed)
History and Physical    Patient: Max Davenport:[id number] DOB: 1946/01/10 DOA: 10/31/2023 DOS: the patient was seen and examined on 10/31/2023 PCP: Benita Stabile, MD  Patient coming from: Home  Chief Complaint:  Chief Complaint  Patient presents with   low hemoglobin   HPI: Max Davenport is a 77 year old male with a history of permanent atrial fibrillation on Xarelto, hypertension, hyperlipidemia, CKD stage III, diabetes mellitus type 2, BPH, basal cell skin cancer, and chronic dizziness presenting with malaise and generalized weakness for the past week.  The patient was recently mated to the hospital from 10/03/2023 to 10/14/2023 at which time the patient was treated for pneumonia and COVID-19.  He was discharged home on antibiotics and steroids and nebulizers.  He states that his breathing has gradually improved since the time of discharge.  However he continues to have malaise and some dizziness.  He has followed up with his PCP.  Routine blood work on 10/26/2023 showed hemoglobin 9.8.  He had a repeat CBC on 10/29/2023 and his hemoglobin dropped to 9.0.  Notably, the patient had a hemoglobin of 14.4 on the day of discharge on 10/14/2023. The patient states that he has been having melanotic stool on a daily basis.  He did have 1 episode of hematochezia when he was straining to have a bowel movement.  He denies any NSAIDs.  He denies any alcohol use.  Besides antibiotics and nebulizer treatments, and steroids, there is no new medications.  He denies any fevers, chills, chest pain, cough, hemoptysis, headache, neck pain, abdominal pain, hematemesis, hematuria.  He has had some shortness of breath and dyspnea on exertion. In the ED, the patient was afebrile and hemodynamically stable with oxygen saturation 97% room air.  WBC 4.6, hemoglobin 8.6, platelets 214.  Sodium 130, potassium 4.5, bicarb 24, serum creatinine 1.63.  LFTs were unremarkable.  FOBT was positive.  GI was consulted and  recommended admission for further evaluation and treatment.  Review of Systems: As mentioned in the history of present illness. All other systems reviewed and are negative. Past Medical History:  Diagnosis Date   Atrial fibrillation (HCC)    Essential hypertension    Hyperlipidemia    Skin cancer, basal cell    Type 2 diabetes mellitus (HCC)    Past Surgical History:  Procedure Laterality Date   BASAL CELL CARCINOMA EXCISION     Top of head   INGUINAL HERNIA REPAIR Right    LUMBAR FUSION  2008   NASAL RECONSTRUCTION     WISDOM TOOTH EXTRACTION     Social History:  reports that he has never smoked. He has never used smokeless tobacco. He reports that he does not drink alcohol and does not use drugs.  Allergies  Allergen Reactions   Ativan [Lorazepam] Other (See Comments)    States it makes him "crazy"    Family History  Adopted: Yes    Prior to Admission medications   Medication Sig Start Date End Date Taking? Authorizing Provider  acetaminophen (TYLENOL) 500 MG tablet Take 500 mg by mouth every 6 (six) hours as needed for mild pain (pain score 1-3).    [provider]  amoxicillin-clavulanate (AUGMENTIN) 500-125 MG tablet Take 1 tablet by mouth 2 (two) times daily. 10/14/23   Joseph Art, DO  atorvastatin (LIPITOR) 20 MG tablet Take 20 mg by mouth at bedtime.    [provider]  benzonatate (TESSALON) 200 MG capsule Take 1 capsule (200 mg total) by  mouth 3 (three) times daily as needed (uncontrolled/refractionary coughing spells). 10/14/23   Joseph Art, DO  dextromethorphan-guaiFENesin (MUCINEX DM) 30-600 MG 12hr tablet Take 1 tablet by mouth 2 (two) times daily. 10/14/23   Joseph Art, DO  ezetimibe (ZETIA) 10 MG tablet TAKE 1 TABLET BY MOUTH AT BEDTIME FOR CHOLESTEROL. 03/08/23   Strader, Lennart Pall, PA-C  ipratropium-albuterol (DUONEB) 0.5-2.5 (3) MG/3ML SOLN Take 3 mLs by nebulization every 4 (four) hours as needed (SOB/cough). 10/14/23    Joseph Art, DO  Lactobacillus (PROBIOTIC ACIDOPHILUS PO) Take 1 capsule by mouth daily.    [provider]  levocetirizine (XYZAL) 5 MG tablet Take 5 mg by mouth at bedtime.    [provider]  losartan (COZAAR) 50 MG tablet Take 50 mg by mouth 2 (two) times daily.    [provider]  meclizine (ANTIVERT) 25 MG tablet Take 25 mg by mouth 3 (three) times daily as needed. 01/13/22   [provider]  metFORMIN (GLUCOPHAGE) 500 MG tablet Take 1 tablet (500 mg total) by mouth daily for 5 days. 10/14/23 10/19/23  Joseph Art, DO  methocarbamol (ROBAXIN) 500 MG tablet Take 1 tablet (500 mg total) by mouth every 8 (eight) hours as needed for muscle spasms. 07/07/23   Magnant, Charles L, PA-C  metoprolol succinate (TOPROL-XL) 25 MG 24 hr tablet TAKE 1/2 TABLET BY MOUTH DAILY. 07/29/23   Jonelle Sidle, MD  Multiple Vitamin (MULTIVITAMIN) tablet Take 1 tablet by mouth daily.    [provider]  oxymetazoline (MUCINEX SINUS-MAX CLEAR & COOL) 0.05 % nasal spray Place 1 spray into both nostrils 2 (two) times daily as needed for congestion.    [provider]  Rivaroxaban (XARELTO) 15 MG TABS tablet Take 15 mg by mouth daily with supper.    [provider]  tamsulosin (FLOMAX) 0.4 MG CAPS capsule Take 0.4 mg by mouth daily after supper.    [provider]  traZODone (DESYREL) 50 MG tablet Take 50 mg by mouth at bedtime. 08/02/23   [provider]    Physical Exam: Vitals:   10/31/23 0841 10/31/23 0849  BP:  125/71  Pulse:  100  Resp:  18  Temp:  98.3 F (36.8 C)  TempSrc:  Oral  SpO2:  97%  Weight: 88 kg   Height: 5\' 9"  (1.753 m)    GENERAL:  A&O x 3, NAD, well developed, cooperative, follows commands HEENT: [place]/AT, No thrush, No icterus, No oral ulcers Neck:  No neck mass, No meningismus, soft, supple CV: RRR, no S3, no S4, no rub, no JVD Lungs:  bibasilar rales, L>R. No wheeze Abd: soft/NT +BS,  nondistended Ext: 1 + LE edema, no lymphangitis, no cyanosis, no rashes Neuro:  CN II-XII intact, strength 4/5 in RUE, RLE, strength 4/5 LUE, LLE; sensation intact bilateral; no dysmetria; babinski equivocal  Data Reviewed: Data reviewed above in history  Assessment and Plan: Acute blood loss anemia/melena -Holding rivaroxaban--last dose was 10/30/2023 at 6 PM -Start pantoprazole IV twice daily -GI consulted -Clear liquid diet -Monitor H&H  CKD stage IIIb -Baseline creatinine 1.5-1.7 -Monitor BMP  Permanent atrial fibrillation -Rate controlled -Continue metoprolol succinate -Holding rivaroxaban  Uncontrolled diabetes mellitus type 2 with hyperglycemia -10/12/2023 hemoglobin A1c 8.0 -NovoLog sliding scale -No longer taking metformin -Holding Farxiga  Essential hypertension -Holding losartan -Continue metoprolol succinate  Mixed hyperlipidemia -Continue statin -Continue Zetia  BPH -Continue tamsulosin          Advance Care Planning:  FULL  Consults: GI  Family Communication: daughter updated 11/17  Severity of Illness: The appropriate patient status for this patient is INPATIENT. Inpatient status is judged to be reasonable and necessary in order to provide the required intensity of service to ensure the patient's safety. The patient's presenting symptoms, physical exam findings, and initial radiographic and laboratory data in the context of their chronic comorbidities is felt to place them at high risk for further clinical deterioration. Furthermore, it is not anticipated that the patient will be medically stable for discharge from the hospital within 2 midnights of admission.   * I certify that at the point of admission it is my clinical judgment that the patient will require inpatient hospital care spanning beyond 2 midnights from the point of admission due to high intensity of service, high risk for further deterioration and high frequency of surveillance  required.*  Author: Catarina Hartshorn, MD 10/31/2023 11:30 AM  For on call review [email or web address].

## 2023-10-31 NOTE — ED Provider Notes (Signed)
[place] EMERGENCY DEPARTMENT AT [facility] Provider Note   CSN: [id number] Arrival date & time: 10/31/23  0818     History  Chief Complaint  Patient presents with   low hemoglobin    Max Davenport is a 76 y.o. male.  HPI Patient presents with reported anemia.  Reportedly hemoglobin of 9.8 on Tuesday and then Friday being 9.  Reportedly baseline hemoglobin is around 19.  Is on Xarelto for A-fib.  States since recent admission to the hospital with discharge about 2 and half weeks ago has been having black stools.  States he is feeling lightheaded.    Home Medications Prior to Admission medications   Medication Sig Start Date End Date Taking? Authorizing Provider  acetaminophen (TYLENOL) 500 MG tablet Take 500 mg by mouth every 6 (six) hours as needed for mild pain (pain score 1-3).   Yes [provider]  atorvastatin (LIPITOR) 20 MG tablet Take 20 mg by mouth at bedtime.   Yes [provider]  benzonatate (TESSALON) 200 MG capsule Take 1 capsule (200 mg total) by mouth 3 (three) times daily as needed (uncontrolled/refractionary coughing spells). 10/14/23  Yes Vann, Selinda Orion, DO  dextromethorphan-guaiFENesin (MUCINEX DM) 30-600 MG 12hr tablet Take 1 tablet by mouth 2 (two) times daily. 10/14/23  Yes Vann, Jessica U, DO  ezetimibe (ZETIA) 10 MG tablet TAKE 1 TABLET BY MOUTH AT BEDTIME FOR CHOLESTEROL. 03/08/23  Yes Strader, [place] M, PA-C  FARXIGA 10 MG TABS tablet Take 10 mg by mouth daily. 10/26/23  Yes [provider]  ipratropium-albuterol (DUONEB) 0.5-2.5 (3) MG/3ML SOLN Take 3 mLs by nebulization every 4 (four) hours as needed (SOB/cough). Patient taking differently: Take 3 mLs by nebulization in the morning and at bedtime. 10/14/23  Yes Vann, Jessica U, DO  Lactobacillus (PROBIOTIC ACIDOPHILUS PO) Take 1 capsule by mouth daily.   Yes [provider]  levocetirizine (XYZAL) 5 MG tablet Take 5 mg by mouth at bedtime.   Yes  [provider]  losartan (COZAAR) 50 MG tablet Take 50 mg by mouth 2 (two) times daily. Hold if bp is low   Yes [provider]  meclizine (ANTIVERT) 25 MG tablet Take 25 mg by mouth 3 (three) times daily as needed for dizziness. 01/13/22  Yes [provider]  metoprolol succinate (TOPROL-XL) 25 MG 24 hr tablet TAKE 1/2 TABLET BY MOUTH DAILY. 07/29/23  Yes Jonelle Sidle, MD  Multiple Vitamin (MULTIVITAMIN) tablet Take 1 tablet by mouth daily.   Yes [provider]  Rivaroxaban (XARELTO) 15 MG TABS tablet Take 15 mg by mouth daily with supper.   Yes [provider]  sodium chloride (OCEAN) 0.65 % SOLN nasal spray Place 1 spray into both nostrils as needed for congestion.   Yes [provider]  tamsulosin (FLOMAX) 0.4 MG CAPS capsule Take 0.4 mg by mouth daily after supper.   Yes [provider]  traZODone (DESYREL) 50 MG tablet Take 50 mg by mouth at bedtime. 08/02/23  Yes [provider]  oxymetazoline (MUCINEX SINUS-MAX CLEAR & COOL) 0.05 % nasal spray Place 1 spray into both nostrils 2 (two) times daily as needed for congestion.    [provider]      Allergies    Ativan [lorazepam]    Review of Systems   Review of Systems  Physical Exam Updated Vital Signs BP (!) 148/70 (BP Location: Right Arm)   Pulse 98   Temp 98 F (36.7 C)  Resp 17   Ht 5\' 9"  (1.753 m)   Wt 88 kg   SpO2 100%   BMI 28.65 kg/m  Physical Exam Vitals and nursing note reviewed.  HENT:     Head: Atraumatic.  Cardiovascular:     Rate and Rhythm: Normal rate.  Pulmonary:     Breath sounds: No wheezing or rhonchi.  Abdominal:     Tenderness: There is no abdominal tenderness.  Musculoskeletal:     Cervical back: Neck supple.  Skin:    Capillary Refill: Capillary refill takes less than 2 seconds.     Coloration: Skin is pale.  Neurological:     Mental Status: He is alert and oriented to person, place, and time.     ED  Results / Procedures / Treatments   Labs (all labs ordered are listed, but only abnormal results are displayed) Labs Reviewed  COMPREHENSIVE METABOLIC PANEL - Abnormal; Notable for the following components:      Result Value   Glucose, Bld 220 (*)    Creatinine, Ser 1.63 (*)    Calcium 8.2 (*)    Total Protein 5.9 (*)    Albumin 3.0 (*)    GFR, Estimated 43 (*)    All other components within normal limits  CBC WITH DIFFERENTIAL/PLATELET - Abnormal; Notable for the following components:   RBC 2.52 (*)    Hemoglobin 8.6 (*)    HCT 26.3 (*)    MCV 104.4 (*)    MCH 34.1 (*)    RDW 16.8 (*)    nRBC 0.4 (*)    All other components within normal limits  POC OCCULT BLOOD, ED - Abnormal; Notable for the following components:   Fecal Occult Bld POSITIVE (*)    All other components within normal limits  PROTIME-INR  TYPE AND SCREEN    EKG None  Radiology No results found.  Procedures Procedures    Medications Ordered in ED Medications  pantoprazole (PROTONIX) injection 40 mg (40 mg Intravenous Given 10/31/23 1004)    Followed by  pantoprazole (PROTONIX) injection 40 mg (has no administration in time range)  atorvastatin (LIPITOR) tablet 20 mg (has no administration in time range)  ezetimibe (ZETIA) tablet 10 mg (has no administration in time range)  metoprolol succinate (TOPROL-XL) 24 hr tablet 12.5 mg (12.5 mg Oral Given 10/31/23 1407)  traZODone (DESYREL) tablet 50 mg (has no administration in time range)  meclizine (ANTIVERT) tablet 25 mg (has no administration in time range)  tamsulosin (FLOMAX) capsule 0.4 mg (has no administration in time range)  multivitamin with minerals tablet 1 tablet (1 tablet Oral Given 10/31/23 1407)  ipratropium-albuterol (DUONEB) 0.5-2.5 (3) MG/3ML nebulizer solution 3 mL (has no administration in time range)  acetaminophen (TYLENOL) tablet 650 mg (has no administration in time range)    Or  acetaminophen (TYLENOL) suppository 650 mg (has no  administration in time range)  ondansetron (ZOFRAN) tablet 4 mg (has no administration in time range)    Or  ondansetron (ZOFRAN) injection 4 mg (has no administration in time range)  melatonin tablet 4.5 mg (has no administration in time range)  insulin aspart (novoLOG) injection 0-9 Units (has no administration in time range)  loratadine (CLARITIN) tablet 10 mg (has no administration in time range)  feeding supplement (BOOST / RESOURCE BREEZE) liquid 1 Container (237 mLs Oral Given 10/31/23 1406)    ED Course/ Medical Decision Making/ A&P  Medical Decision Making Amount and/or Complexity of Data Reviewed Labs: ordered.  Risk Prescription drug management. Decision regarding hospitalization.   Patient with reported anemia.  Reportedly feeling lightheaded and fatigued.  Hemoglobin reportedly 9 as an outpatient.  Black stool.  Differential diagnosis includes likely upper GI bleed.  Is on anticoagulation.  Will give IV Protonix at this time.  Will check Hemoccult and basic blood work.  Hemoccult positive.  Hemoglobin is 8.6.  Discussed with Dr. Marletta Lor who will see patient.  Had his anticoagulation last night.  IV Protonix have been given.  Will admit to internal medicine.  CRITICAL CARE Performed by: Benjiman Core Total critical care time: 30 minutes Critical care time was exclusive of separately billable procedures and treating other patients. Critical care was necessary to treat or prevent imminent or life-threatening deterioration. Critical care was time spent personally by me on the following activities: development of treatment plan with patient and/or surrogate as well as nursing, discussions with consultants, evaluation of patient's response to treatment, examination of patient, obtaining history from patient or surrogate, ordering and performing treatments and interventions, ordering and review of laboratory studies, ordering and review of  radiographic studies, pulse oximetry and re-evaluation of patient's condition.         Final Clinical Impression(s) / ED Diagnoses Final diagnoses:  Upper GI bleed  Anemia, unspecified type    Rx / DC Orders ED Discharge Orders     None         Benjiman Core, MD 10/31/23 1534

## 2023-11-01 ENCOUNTER — Inpatient Hospital Stay (HOSPITAL_COMMUNITY): Payer: HMO

## 2023-11-01 DIAGNOSIS — E440 Moderate protein-calorie malnutrition: Secondary | ICD-10-CM | POA: Insufficient documentation

## 2023-11-01 DIAGNOSIS — K921 Melena: Secondary | ICD-10-CM | POA: Diagnosis not present

## 2023-11-01 DIAGNOSIS — I4819 Other persistent atrial fibrillation: Secondary | ICD-10-CM | POA: Diagnosis not present

## 2023-11-01 DIAGNOSIS — D62 Acute posthemorrhagic anemia: Secondary | ICD-10-CM | POA: Diagnosis not present

## 2023-11-01 DIAGNOSIS — Z7901 Long term (current) use of anticoagulants: Secondary | ICD-10-CM | POA: Diagnosis not present

## 2023-11-01 DIAGNOSIS — E1165 Type 2 diabetes mellitus with hyperglycemia: Secondary | ICD-10-CM | POA: Diagnosis not present

## 2023-11-01 LAB — CBC
HCT: 23.8 % — ABNORMAL LOW (ref 39.0–52.0)
Hemoglobin: 7.8 g/dL — ABNORMAL LOW (ref 13.0–17.0)
MCH: 34.4 pg — ABNORMAL HIGH (ref 26.0–34.0)
MCHC: 32.8 g/dL (ref 30.0–36.0)
MCV: 104.8 fL — ABNORMAL HIGH (ref 80.0–100.0)
Platelets: 184 10*3/uL (ref 150–400)
RBC: 2.27 MIL/uL — ABNORMAL LOW (ref 4.22–5.81)
RDW: 16.8 % — ABNORMAL HIGH (ref 11.5–15.5)
WBC: 6.7 10*3/uL (ref 4.0–10.5)
nRBC: 0 % (ref 0.0–0.2)

## 2023-11-01 LAB — BASIC METABOLIC PANEL
Anion gap: 6 (ref 5–15)
BUN: 15 mg/dL (ref 8–23)
CO2: 27 mmol/L (ref 22–32)
Calcium: 8.6 mg/dL — ABNORMAL LOW (ref 8.9–10.3)
Chloride: 104 mmol/L (ref 98–111)
Creatinine, Ser: 1.54 mg/dL — ABNORMAL HIGH (ref 0.61–1.24)
GFR, Estimated: 46 mL/min — ABNORMAL LOW (ref >60)
Glucose, Bld: 140 mg/dL — ABNORMAL HIGH (ref 70–99)
Potassium: 4.3 mmol/L (ref 3.5–5.1)
Sodium: 137 mmol/L (ref 135–145)

## 2023-11-01 LAB — GLUCOSE, CAPILLARY
Glucose-Capillary: 153 mg/dL — ABNORMAL HIGH (ref 70–99)
Glucose-Capillary: 293 mg/dL — ABNORMAL HIGH (ref 70–99)
Glucose-Capillary: 68 mg/dL — ABNORMAL LOW (ref 70–99)
Glucose-Capillary: 88 mg/dL (ref 70–99)

## 2023-11-01 MED ORDER — IPRATROPIUM-ALBUTEROL 0.5-2.5 (3) MG/3ML IN SOLN
3.0000 mL | Freq: Two times a day (BID) | RESPIRATORY_TRACT | Status: DC
  Administered 2023-11-01 – 2023-11-04 (×7): 3 mL via RESPIRATORY_TRACT
  Filled 2023-11-01 (×7): qty 3

## 2023-11-01 MED ORDER — PEG 3350-KCL-NA BICARB-NACL 420 G PO SOLR
4000.0000 mL | Freq: Once | ORAL | Status: AC
  Administered 2023-11-01: 4000 mL via ORAL

## 2023-11-01 NOTE — Progress Notes (Signed)
Initial Nutrition Assessment  DOCUMENTATION CODES:   Non-severe (moderate) malnutrition in context of acute illness/injury  INTERVENTION:   Continue Boost Breeze po TID, each supplement provides 250 kcal and 9 grams of protein. When diet advanced, change supplement to Ensure Plus High Protein po BID, each supplement provides 350 kcal and 20 grams of protein. Continue MVI with minerals daily.  NUTRITION DIAGNOSIS:   Moderate Malnutrition related to acute illness (respiratory illness (PNA, COVID)) as evidenced by mild muscle depletion, energy intake < 75% for > 7 days, percent weight loss (8% weight loss in 2 months).  GOAL:   Patient will meet greater than or equal to 90% of their needs  MONITOR:   PO intake, Supplement acceptance  REASON FOR ASSESSMENT:   Malnutrition Screening Tool    ASSESSMENT:   77 yo male admitted with acute blood loss anemia / melena. PMH includes HTN, skin cancer (top of head), DM-2, HLD, A fib.  Patient reports ongoing poor appetite and intake r/t prolonged respiratory illness (sinus infection, PNA, COVID positive 10/12/23). He has been eating poorly for the past few months and has lost ~15 lbs. He was eating ~ half of his usual intake. He has been receiving Boost Breeze supplements, but prefers Ensure.   He states that his Hgb is usually around 19 and has dropped to around 8. Unknown source of bleeding. GI following with plans for EGD and colonoscopy 11/9 after Xarelto washout.    Labs reviewed.  CBG: [phone]  Medications reviewed and include novolog, MVI with minerals, protonix, flomax.  Weight history reviewed. Patient has had 8% weight loss in the past 2 months. Patient meets criteria for moderate malnutrition, given mild depletion of muscle mass, severe weight loss, and intake < 75% of estimated energy requirement for > 7 days.  NUTRITION - FOCUSED PHYSICAL EXAM:  Flowsheet Row Most Recent Value  Orbital Region No depletion  Upper Arm  Region No depletion  Thoracic and Lumbar Region No depletion  Buccal Region No depletion  Temple Region Mild depletion  Clavicle Bone Region Mild depletion  Clavicle and Acromion Bone Region No depletion  Scapular Bone Region No depletion  Dorsal Hand No depletion  Patellar Region Mild depletion  Anterior Thigh Region Mild depletion  Posterior Calf Region Mild depletion  Edema (RD Assessment) Mild  Hair Reviewed  Eyes Reviewed  Mouth Reviewed  Skin Reviewed  Nails Reviewed       Diet Order:   Diet Order             Diet clear liquid Room service appropriate? Yes; Fluid consistency: Thin  Diet effective now                   EDUCATION NEEDS:   Not appropriate for education at this time  Skin:  Skin Assessment: Reviewed RN Assessment  Last BM:  11/18  Height:   Ht Readings from Last 1 Encounters:  10/31/23 5\' 9"  (1.753 m)    Weight:   Wt Readings from Last 1 Encounters:  10/31/23 88 kg    Ideal Body Weight:  72.7 kg  BMI:  Body mass index is 28.65 kg/m.  Estimated Nutritional Needs:   Kcal:  2100-2300  Protein:  105-120 gm  Fluid:  2.1-2.3 L   Gabriel Rainwater RD, LDN, CNSC Please refer to Amion for contact information.

## 2023-11-01 NOTE — Progress Notes (Signed)
   11/01/23 0918  TOC Brief Assessment  Insurance and Status Reviewed  Patient has primary care physician Yes  Home environment has been reviewed from home  Prior level of function: independent  Prior/Current Home Services No current home services  Social Determinants of Health Reivew SDOH reviewed no interventions necessary  Readmission risk has been reviewed Yes  Transition of care needs no transition of care needs at this time   Transition of Care Department [facility] has reviewed patient and no TOC needs have been identified at this time. We will continue to monitor patient advancement through interdisciplinary progression rounds. If new patient transition needs arise, please place a TOC consult.

## 2023-11-01 NOTE — Progress Notes (Signed)
Gastroenterology Progress Note    Patient ID: Max Davenport; [id number]; [birth year]    Subjective   Black stool this morning. Denies fatigue. Last dose of Xarelto Friday evening after he thought about this more. No abdominal pain, N/V. He is unsure the last colonoscopy. States this was years ago. No prior EGD. He notes he has had intermittent black stool since late October. Denies any shortness of breath.    Objective   Vital signs in last 24 hours Temp:  [98 F (36.7 C)-98.7 F (37.1 C)] 98 F (36.7 C) (11/18 0545) Pulse Rate:  [66-98] 66 (11/18 0545) Resp:  [16-18] 16 (11/18 0545) BP: (100-148)/(54-87) 124/75 (11/18 0545) SpO2:  [97 %-100 %] 98 % (11/18 0726) Last BM Date : 10/31/23  Physical Exam General:   Alert and oriented, pleasant Head:  Normocephalic and atraumatic. Abdomen:  Bowel sounds present, soft, non-tender, non-distended. No HSM or hernias noted. No rebound or guarding. No masses appreciated  Extremities:  Without edema. Neurologic:  Alert and  oriented x4    Intake/Output from previous day: 11/17 0701 - 11/18 0700 In: 960 [P.O.:960] Out: -  Intake/Output this shift: Total I/O In: 400 [P.O.:400] Out: -   Lab Results  Recent Labs    10/31/23 0951 11/01/23 0437  WBC 5.6 6.7  HGB 8.6* 7.8*  HCT 26.3* 23.8*  PLT 214 184   BMET Recent Labs    10/31/23 0951 11/01/23 0437  NA 138 137  K 4.5 4.3  CL 105 104  CO2 24 27  GLUCOSE 220* 140*  BUN 22 15  CREATININE 1.63* 1.54*  CALCIUM 8.2* 8.6*   LFT Recent Labs    10/31/23 0951  PROT 5.9*  ALBUMIN 3.0*  AST 25  ALT 26  ALKPHOS 60  BILITOT 0.4   PT/INR Recent Labs    10/31/23 0951  LABPROT 13.6  INR 1.0     Studies/Results CT HEAD W & WO CONTRAST ( )  Result Date: 10/29/2023 CLINICAL DATA:  Confusion with dizziness, history of basal cell EXAM: CT HEAD WITHOUT AND WITH CONTRAST TECHNIQUE: Contiguous axial images were obtained from the base of the skull through the  vertex without and with intravenous contrast. RADIATION DOSE REDUCTION: This exam was performed according to the departmental dose-optimization program which includes automated exposure control, adjustment of the mA and/or kV according to patient size and/or use of iterative reconstruction technique. CONTRAST:  75mL ISOVUE-300 IOPAMIDOL (ISOVUE-300) INJECTION 61% COMPARISON:  None Available. FINDINGS: Brain: No abnormal parenchymal or meningeal enhancement. No evidence of acute infarction, hemorrhage, mass, mass effect, or midline shift. No hydrocephalus or extra-axial fluid collection. Periventricular white matter changes, likely the sequela of chronic small vessel ischemic disease. Vascular: No hyperdense vessel on precontrast images. Grossly normal arterial and venous enhancement Skull: Negative for fracture or focal lesion. Sinuses/Orbits: Minimal mucosal thickening in the paranasal sinuses. No acute finding in the orbits. Other: The mastoid air cells are well aerated. IMPRESSION: No acute intracranial process. Electronically Signed   By: Wiliam Ke M.D.   On: 10/29/2023 11:13   CT Chest W Contrast  Result Date: 10/12/2023 CLINICAL DATA:  Cough, chronic, persisting greater than 8 weeks, failed empiric treatment EXAM: CT CHEST WITH CONTRAST TECHNIQUE: Multidetector CT imaging of the chest was performed during intravenous contrast administration. RADIATION DOSE REDUCTION: This exam was performed according to the departmental dose-optimization program which includes automated exposure control, adjustment of the mA and/or kV according to patient size and/or use of iterative reconstruction technique. CONTRAST:  75mL OMNIPAQUE IOHEXOL 300 MG/ML  SOLN COMPARISON:  Radiographs earlier today FINDINGS: Cardiovascular: Normal heart size. No pericardial effusion. Coronary artery and aortic atherosclerotic calcification. Mediastinum/Nodes: Trachea and esophagus are unremarkable. Borderline mediastinal and left hilar  adenopathy. For example 1.1 cm pretracheal lymph node on 2/54 and 1.1 cm left hilar lymph node on 2/71. Lungs/Pleura: Bilateral peripheral and peribronchovascular predominant consolidation and ground-glass opacities greatest in the left lower lobe. Small cyst with cavitation in the posterior left lower lobe (3/88). No pleural effusion or pneumothorax. Upper Abdomen: No acute abnormality. Musculoskeletal: No acute fracture. IMPRESSION: Multifocal airspace opacities. This may represent multifocal pneumonia. In the setting of chronic symptoms, differential considerations include cryptogenic organizing pneumonia and chronic eosinophilic pneumonia. Aortic Atherosclerosis (ICD10-I70.0). Electronically Signed   By: Minerva Fester M.D.   On: 10/12/2023 03:54   DG Chest 2 View  Result Date: 10/12/2023 CLINICAL DATA:  Cough, weight loss EXAM: CHEST - 2 VIEW COMPARISON:  08/26/2006 FINDINGS: The basilar airspace opacities compatible with pneumonia. Suspected small left pleural effusion. No pneumothorax. Normal cardiomediastinal silhouette. IMPRESSION: Left lower lobe pneumonia. Follow-up in 6-8 weeks after treatment is recommended to ensure resolution. Electronically Signed   By: Minerva Fester M.D.   On: 10/12/2023 00:19    Assessment  77 y.o. male with a history of afib on Xarelto, HTN, dyslipidemia, CKD, diabetes, BPH, pneumonia end of Oct 2024, presenting to [facility] with acute blood loss anemia and melena.   Hgb 8.6 on admission, previously 14 range end of October. Heme positive. He has noted intermittent black stool with last this morning. Previously, he has also noted paper hematochezia after straining. Xarelto has been on hold with last dose Friday evening (patient initially thought Saturday evening).   As last colonoscopy over 10 years ago and no prior EGD, will plan on Colonoscopy /EGD on 11/19. Although noting melena, unable to exclude occult lower GI lesion. Discussed with patient the risks and  benefits. We can start prep this afternoon.     Plan / Recommendations  PPI BID Clear liquids NPO after midnight Start colon prep this afternoon, tap water enemas in am Plan on colonoscopy/EGD tomorrow by Dr. Levon Hedger Transfuse for Hgb less than 7 Continue holding Xarelto     LOS: 1 day    11/01/2023, 9:37 AM  Gelene Mink, PhD, ANP-BC [facility]

## 2023-11-01 NOTE — Progress Notes (Addendum)
PROGRESS NOTE  Max Davenport QMV:[id number] DOB: 20-Jul-1946 DOA: 10/31/2023 PCP: Benita Stabile, MD  Brief History:  77 year old male with a history of permanent atrial fibrillation on Xarelto, hypertension, hyperlipidemia, CKD stage III, diabetes mellitus type 2, BPH, basal cell skin cancer, and chronic dizziness presenting with malaise and generalized weakness for the past week.  The patient was recently mated to the hospital from 10/03/2023 to 10/14/2023 at which time the patient was treated for pneumonia and COVID-19.  He was discharged home on antibiotics and steroids and nebulizers.  He states that his breathing has gradually improved since the time of discharge.  However he continues to have malaise and some dizziness.  He has followed up with his PCP.  Routine blood work on 10/26/2023 showed hemoglobin 9.8.  He had a repeat CBC on 10/29/2023 and his hemoglobin dropped to 9.0.  Notably, the patient had a hemoglobin of 14.4 on the day of discharge on 10/14/2023. The patient states that he has been having melanotic stool on a daily basis.  He did have 1 episode of hematochezia when he was straining to have a bowel movement.  He denies any NSAIDs.  He denies any alcohol use.  Besides antibiotics and nebulizer treatments, and steroids, there is no new medications.  He denies any fevers, chills, chest pain, cough, hemoptysis, headache, neck pain, abdominal pain, hematemesis, hematuria.  He has had some shortness of breath and dyspnea on exertion. In the ED, the patient was afebrile and hemodynamically stable with oxygen saturation 97% room air.  WBC 4.6, hemoglobin 8.6, platelets 214.  Sodium 130, potassium 4.5, bicarb 24, serum creatinine 1.63.  LFTs were unremarkable.  FOBT was positive.  GI was consulted and recommended admission for further evaluation and treatment.   Assessment/Plan: Acute blood loss anemia/melena -Holding rivaroxaban--last dose was 10/30/2023 at 6 PM -Start pantoprazole  IV twice daily -GI consulted -Clear liquid diet -Monitor H&H>>slight trend down to 7.8 -appreciate GI--plan EGD and colonoscopy on 11/19   CKD stage IIIb -Baseline creatinine 1.5-1.7 -Monitor BMP   Permanent atrial fibrillation -Rate controlled -Continue metoprolol succinate -Holding rivaroxaban   Uncontrolled diabetes mellitus type 2 with hyperglycemia -10/12/2023 hemoglobin A1c 8.0 -NovoLog sliding scale -No longer taking metformin -Holding Farxiga   Essential hypertension -Holding losartan -Continue metoprolol succinate -BP remains controlled   Mixed hyperlipidemia -Continue statin -Continue Zetia   BPH -Continue tamsulosin   Moderate Malnutrition -continue supplements        Family Communication:  no Family at bedside  Consultants:  GI  Code Status:  FULL   DVT Prophylaxis:  SCDs   Procedures: As Listed in Progress Note Above  Antibiotics: None     Subjective: Patient denies fevers, chills, headache, chest pain, dyspnea, nausea, vomiting, diarrhea, abdominal pain, dysuria, hematuria, hematochezia -still having some dark/black stool   Objective: Vitals:   10/31/23 2335 11/01/23 0545 11/01/23 0726 11/01/23 1402  BP: (!) 100/54 124/75  136/71  Pulse: 89 66  98  Resp: 18 16  17   Temp: 98.4 F (36.9 C) 98 F (36.7 C)  98.2 F (36.8 C)  TempSrc: Axillary Axillary    SpO2: 99% 98% 98% 100%  Weight:      Height:        Intake/Output Summary (Last 24 hours) at 11/01/2023 1654 Last data filed at 11/01/2023 1300 Gross per 24 hour  Intake 1480 ml  Output --  Net 1480 ml   Weight change:  Exam:  General:  Pt is alert, follows commands appropriately, not in acute distress HEENT: No icterus, No thrush, No neck mass, [place]/AT Cardiovascular: IRRR, S1/S2, no rubs, no gallops Respiratory: CTA bilaterally, no wheezing, no crackles, no rhonchi Abdomen: Soft/+BS, non tender, non distended, no guarding Extremities: No edema, No lymphangitis,  No petechiae, No rashes, no synovitis   Data Reviewed: I have personally reviewed following labs and imaging studies Basic Metabolic Panel: Recent Labs  Lab 10/31/23 0951 11/01/23 0437  NA 138 137  K 4.5 4.3  CL 105 104  CO2 24 27  GLUCOSE 220* 140*  BUN 22 15  CREATININE 1.63* 1.54*  CALCIUM 8.2* 8.6*   Liver Function Tests: Recent Labs  Lab 10/31/23 0951  AST 25  ALT 26  ALKPHOS 60  BILITOT 0.4  PROT 5.9*  ALBUMIN 3.0*   No results for input(s): "LIPASE", "AMYLASE" in the last 168 hours. No results for input(s): "AMMONIA" in the last 168 hours. Coagulation Profile: Recent Labs  Lab 10/31/23 0951  INR 1.0   CBC: Recent Labs  Lab 10/31/23 0951 11/01/23 0437  WBC 5.6 6.7  NEUTROABS 4.5  --   HGB 8.6* 7.8*  HCT 26.3* 23.8*  MCV 104.4* 104.8*  PLT 214 184   Cardiac Enzymes: No results for input(s): "CKTOTAL", "CKMB", "CKMBINDEX", "TROPONINI" in the last 168 hours. BNP: Invalid input(s): "POCBNP" CBG: Recent Labs  Lab 10/31/23 2028 11/01/23 0755 11/01/23 1116 11/01/23 1602 11/01/23 1637  GLUCAP 97 153* 293* 68* 88   HbA1C: No results for input(s): "HGBA1C" in the last 72 hours. Urine analysis:    Component Value Date/Time   COLORURINE YELLOW 07/12/2007 1730   APPEARANCEUR CLEAR 07/12/2007 1730   LABSPEC 1.014 07/12/2007 1730   PHURINE 7.5 07/12/2007 1730   GLUCOSEU NEGATIVE 07/12/2007 1730   HGBUR NEGATIVE 07/12/2007 1730   BILIRUBINUR NEGATIVE 07/12/2007 1730   KETONESUR NEGATIVE 07/12/2007 1730   PROTEINUR NEGATIVE 07/12/2007 1730   UROBILINOGEN 0.2 07/12/2007 1730   NITRITE NEGATIVE 07/12/2007 1730   LEUKOCYTESUR  07/12/2007 1730    NEGATIVE MICROSCOPIC NOT DONE ON URINES WITH NEGATIVE PROTEIN, BLOOD, LEUKOCYTES, NITRITE, OR GLUCOSE <1000 mg/dL.   Sepsis Labs: @LABRCNTIP (procalcitonin:4,lacticidven:4) )No results found for this or any previous visit (from the past 240 hour(s)).   Scheduled Meds:  atorvastatin  20 mg Oral QHS    ezetimibe  10 mg Oral Daily   feeding supplement  1 Container Oral TID BM   insulin aspart  0-9 Units Subcutaneous TID WC   ipratropium-albuterol  3 mL Nebulization BID   loratadine  10 mg Oral QHS   melatonin  4.5 mg Oral QHS   metoprolol succinate  12.5 mg Oral Daily   multivitamin with minerals  1 tablet Oral Daily   pantoprazole (PROTONIX) IV  40 mg Intravenous Q12H   tamsulosin  0.4 mg Oral QPC supper   traZODone  50 mg Oral QHS   Continuous Infusions:  Procedures/Studies: CT HEAD W & WO CONTRAST ( )  Result Date: 10/29/2023 CLINICAL DATA:  Confusion with dizziness, history of basal cell EXAM: CT HEAD WITHOUT AND WITH CONTRAST TECHNIQUE: Contiguous axial images were obtained from the base of the skull through the vertex without and with intravenous contrast. RADIATION DOSE REDUCTION: This exam was performed according to the departmental dose-optimization program which includes automated exposure control, adjustment of the mA and/or kV according to patient size and/or use of iterative reconstruction technique. CONTRAST:  75mL ISOVUE-300 IOPAMIDOL (ISOVUE-300) INJECTION 61% COMPARISON:  None Available. FINDINGS:  Brain: No abnormal parenchymal or meningeal enhancement. No evidence of acute infarction, hemorrhage, mass, mass effect, or midline shift. No hydrocephalus or extra-axial fluid collection. Periventricular white matter changes, likely the sequela of chronic small vessel ischemic disease. Vascular: No hyperdense vessel on precontrast images. Grossly normal arterial and venous enhancement Skull: Negative for fracture or focal lesion. Sinuses/Orbits: Minimal mucosal thickening in the paranasal sinuses. No acute finding in the orbits. Other: The mastoid air cells are well aerated. IMPRESSION: No acute intracranial process. Electronically Signed   By: Wiliam Ke M.D.   On: 10/29/2023 11:13   CT Chest W Contrast  Result Date: 10/12/2023 CLINICAL DATA:  Cough, chronic, persisting  greater than 8 weeks, failed empiric treatment EXAM: CT CHEST WITH CONTRAST TECHNIQUE: Multidetector CT imaging of the chest was performed during intravenous contrast administration. RADIATION DOSE REDUCTION: This exam was performed according to the departmental dose-optimization program which includes automated exposure control, adjustment of the mA and/or kV according to patient size and/or use of iterative reconstruction technique. CONTRAST:  75mL OMNIPAQUE IOHEXOL 300 MG/ML  SOLN COMPARISON:  Radiographs earlier today FINDINGS: Cardiovascular: Normal heart size. No pericardial effusion. Coronary artery and aortic atherosclerotic calcification. Mediastinum/Nodes: Trachea and esophagus are unremarkable. Borderline mediastinal and left hilar adenopathy. For example 1.1 cm pretracheal lymph node on 2/54 and 1.1 cm left hilar lymph node on 2/71. Lungs/Pleura: Bilateral peripheral and peribronchovascular predominant consolidation and ground-glass opacities greatest in the left lower lobe. Small cyst with cavitation in the posterior left lower lobe (3/88). No pleural effusion or pneumothorax. Upper Abdomen: No acute abnormality. Musculoskeletal: No acute fracture. IMPRESSION: Multifocal airspace opacities. This may represent multifocal pneumonia. In the setting of chronic symptoms, differential considerations include cryptogenic organizing pneumonia and chronic eosinophilic pneumonia. Aortic Atherosclerosis (ICD10-I70.0). Electronically Signed   By: Minerva Fester M.D.   On: 10/12/2023 03:54   DG Chest 2 View  Result Date: 10/12/2023 CLINICAL DATA:  Cough, weight loss EXAM: CHEST - 2 VIEW COMPARISON:  08/26/2006 FINDINGS: The basilar airspace opacities compatible with pneumonia. Suspected small left pleural effusion. No pneumothorax. Normal cardiomediastinal silhouette. IMPRESSION: Left lower lobe pneumonia. Follow-up in 6-8 weeks after treatment is recommended to ensure resolution. Electronically Signed   By:  Minerva Fester M.D.   On: 10/12/2023 00:19    Catarina Hartshorn, DO  [facility]  If 7PM-7AM, please contact night-coverage [email or web address] Password [email or web address] 11/01/2023, 4:54 PM   LOS: 1 day

## 2023-11-02 ENCOUNTER — Encounter (HOSPITAL_COMMUNITY)
Admission: EM | Disposition: A | Discharge status: Home / Self Care | Payer: Self-pay | Source: Home / Self Care | Attending: Internal Medicine

## 2023-11-02 ENCOUNTER — Inpatient Hospital Stay (HOSPITAL_COMMUNITY): Payer: HMO | Admitting: Anesthesiology

## 2023-11-02 ENCOUNTER — Ambulatory Visit: Payer: HMO | Admitting: Physician Assistant

## 2023-11-02 ENCOUNTER — Encounter (HOSPITAL_COMMUNITY): Payer: Self-pay | Admitting: Internal Medicine

## 2023-11-02 DIAGNOSIS — K2270 Barrett's esophagus without dysplasia: Secondary | ICD-10-CM | POA: Diagnosis not present

## 2023-11-02 DIAGNOSIS — D509 Iron deficiency anemia, unspecified: Secondary | ICD-10-CM

## 2023-11-02 DIAGNOSIS — K5730 Diverticulosis of large intestine without perforation or abscess without bleeding: Secondary | ICD-10-CM | POA: Diagnosis not present

## 2023-11-02 DIAGNOSIS — D124 Benign neoplasm of descending colon: Secondary | ICD-10-CM

## 2023-11-02 DIAGNOSIS — K449 Diaphragmatic hernia without obstruction or gangrene: Secondary | ICD-10-CM

## 2023-11-02 DIAGNOSIS — D500 Iron deficiency anemia secondary to blood loss (chronic): Secondary | ICD-10-CM

## 2023-11-02 DIAGNOSIS — K921 Melena: Secondary | ICD-10-CM | POA: Diagnosis not present

## 2023-11-02 DIAGNOSIS — K635 Polyp of colon: Secondary | ICD-10-CM

## 2023-11-02 DIAGNOSIS — N189 Chronic kidney disease, unspecified: Secondary | ICD-10-CM

## 2023-11-02 DIAGNOSIS — D123 Benign neoplasm of transverse colon: Secondary | ICD-10-CM

## 2023-11-02 DIAGNOSIS — E1122 Type 2 diabetes mellitus with diabetic chronic kidney disease: Secondary | ICD-10-CM | POA: Diagnosis not present

## 2023-11-02 DIAGNOSIS — E1165 Type 2 diabetes mellitus with hyperglycemia: Secondary | ICD-10-CM | POA: Diagnosis not present

## 2023-11-02 DIAGNOSIS — N1832 Chronic kidney disease, stage 3b: Secondary | ICD-10-CM | POA: Diagnosis not present

## 2023-11-02 DIAGNOSIS — I129 Hypertensive chronic kidney disease with stage 1 through stage 4 chronic kidney disease, or unspecified chronic kidney disease: Secondary | ICD-10-CM

## 2023-11-02 DIAGNOSIS — D62 Acute posthemorrhagic anemia: Secondary | ICD-10-CM | POA: Diagnosis not present

## 2023-11-02 HISTORY — PX: COLONOSCOPY WITH PROPOFOL: SHX5780

## 2023-11-02 HISTORY — PX: ESOPHAGOGASTRODUODENOSCOPY (EGD) WITH PROPOFOL: SHX5813

## 2023-11-02 HISTORY — PX: BIOPSY: SHX5522

## 2023-11-02 LAB — CBC
HCT: 24 % — ABNORMAL LOW (ref 39.0–52.0)
Hemoglobin: 7.8 g/dL — ABNORMAL LOW (ref 13.0–17.0)
MCH: 33.8 pg (ref 26.0–34.0)
MCHC: 32.5 g/dL (ref 30.0–36.0)
MCV: 103.9 fL — ABNORMAL HIGH (ref 80.0–100.0)
Platelets: 177 10*3/uL (ref 150–400)
RBC: 2.31 MIL/uL — ABNORMAL LOW (ref 4.22–5.81)
RDW: 16.8 % — ABNORMAL HIGH (ref 11.5–15.5)
WBC: 4 10*3/uL (ref 4.0–10.5)
nRBC: 0 % (ref 0.0–0.2)

## 2023-11-02 LAB — GLUCOSE, CAPILLARY
Glucose-Capillary: 133 mg/dL — ABNORMAL HIGH (ref 70–99)
Glucose-Capillary: 143 mg/dL — ABNORMAL HIGH (ref 70–99)
Glucose-Capillary: 134 mg/dL — ABNORMAL HIGH (ref 70–99)
Glucose-Capillary: 146 mg/dL — ABNORMAL HIGH (ref 70–99)
Glucose-Capillary: 119 mg/dL — ABNORMAL HIGH (ref 70–99)

## 2023-11-02 SURGERY — COLONOSCOPY WITH PROPOFOL
Anesthesia: General

## 2023-11-02 MED ORDER — PROPOFOL 500 MG/50ML IV EMUL
INTRAVENOUS | Status: DC | PRN
  Administered 2023-11-02: 100 ug/kg/min via INTRAVENOUS

## 2023-11-02 MED ORDER — PROPOFOL 10 MG/ML IV BOLUS
INTRAVENOUS | Status: DC | PRN
  Administered 2023-11-02: 100 mg via INTRAVENOUS
  Administered 2023-11-02: 50 mg via INTRAVENOUS

## 2023-11-02 MED ORDER — LACTATED RINGERS IV SOLN: INTRAVENOUS | Status: DC

## 2023-11-02 MED ORDER — SODIUM CHLORIDE 0.9 % IV SOLN: INTRAVENOUS | Status: DC

## 2023-11-02 MED ORDER — LIDOCAINE HCL (CARDIAC) PF 100 MG/5ML IV SOSY
PREFILLED_SYRINGE | INTRAVENOUS | Status: DC | PRN
  Administered 2023-11-02: 50 mg via INTRAVENOUS

## 2023-11-02 NOTE — Anesthesia Preprocedure Evaluation (Signed)
Anesthesia Evaluation  Patient identified by MRN, date of birth, ID band Patient awake    Reviewed: Allergy & Precautions, H&P , NPO status , Patient's Chart, lab work & pertinent test results, reviewed documented beta blocker date and time   Airway Mallampati: II  TM Distance: >3 FB Neck ROM: full    Dental no notable dental hx.    Pulmonary pneumonia   Pulmonary exam normal breath sounds clear to auscultation       Cardiovascular Exercise Tolerance: Good hypertension, + dysrhythmias Atrial Fibrillation  Rhythm:regular Rate:Normal     Neuro/Psych negative neurological ROS  negative psych ROS   GI/Hepatic negative GI ROS, Neg liver ROS,,,  Endo/Other  diabetes    Renal/GU Renal disease  negative genitourinary   Musculoskeletal   Abdominal   Peds  Hematology  (+) Blood dyscrasia, anemia   Anesthesia Other Findings   Reproductive/Obstetrics negative OB ROS                             Anesthesia Physical Anesthesia Plan  ASA: 4 and emergent  Anesthesia Plan: General   Post-op Pain Management:    Induction:   PONV Risk Score and Plan: Propofol infusion  Airway Management Planned:   Additional Equipment:   Intra-op Plan:   Post-operative Plan:   Informed Consent: I have reviewed the patients History and Physical, chart, labs and discussed the procedure including the risks, benefits and alternatives for the proposed anesthesia with the patient or authorized representative who has indicated his/her understanding and acceptance.     Dental Advisory Given  Plan Discussed with: CRNA  Anesthesia Plan Comments:        Anesthesia Quick Evaluation

## 2023-11-02 NOTE — Brief Op Note (Signed)
10/31/2023 - 11/02/2023  2:37 PM  PATIENT:  Max Davenport  77 y.o. male  PRE-OPERATIVE DIAGNOSIS:  acute blood loss anemia, melena  POST-OPERATIVE DIAGNOSIS:  EGD: hiatal hernia  PROCEDURE:  Procedure(s): COLONOSCOPY WITH PROPOFOL (N/A) ESOPHAGOGASTRODUODENOSCOPY (EGD) WITH PROPOFOL (N/A) BIOPSY  SURGEON:  Surgeons and Role:    * Dolores Frame, MD - Primary  Patient underwent EGD AND COLONOSCOPY under propofol sedation.  Tolerated the procedure adequately.   EGD FINDINGS: - Esophageal mucosal changes classified as Barrett's stage C0-M2 per [place] criteria.  Biopsied.  - 2 cm hiatal hernia.  - Normal stomach.  - Normal examined duodenum.   COLONOSCOPY FINDINGS: - The examined portion of the ileum was normal.  - Two 3 to 4 mm polyps in the transverse colon, removed with a cold snare.  Resected and retrieved.  - One 3 mm polyp in the descending colon, removed with a cold snare.  Resected and retrieved.  - Diverticulosis in the sigmoid colon and in the descending colon.  - The distal rectum and anal verge are normal on retroflexion view.   RECOMMENDATIONS - Await pathology results.  - Repeat colonoscopy for surveillance based on pathology results.  - Return patient to hospital ward for ongoing care.  - Clear liquid diet today. NPO after MN. - Proceed with capsule endoscopy tomorrow.  Katrinka Blazing, MD Gastroenterology and Hepatology [facility]

## 2023-11-02 NOTE — Progress Notes (Signed)
PROGRESS NOTE  Max Davenport ZOX:[id number] DOB: 1946-12-06 DOA: 10/31/2023 PCP: Benita Stabile, MD  Brief History:  77 year old male with a history of permanent atrial fibrillation on Xarelto, hypertension, hyperlipidemia, CKD stage III, diabetes mellitus type 2, BPH, basal cell skin cancer, and chronic dizziness presenting with malaise and generalized weakness for the past week.  The patient was recently mated to the hospital from 10/03/2023 to 10/14/2023 at which time the patient was treated for pneumonia and COVID-19.  He was discharged home on antibiotics and steroids and nebulizers.  He states that his breathing has gradually improved since the time of discharge.  However he continues to have malaise and some dizziness.  He has followed up with his PCP.  Routine blood work on 10/26/2023 showed hemoglobin 9.8.  He had a repeat CBC on 10/29/2023 and his hemoglobin dropped to 9.0.  Notably, the patient had a hemoglobin of 14.4 on the day of discharge on 10/14/2023. The patient states that he has been having melanotic stool on a daily basis.  He did have 1 episode of hematochezia when he was straining to have a bowel movement.  He denies any NSAIDs.  He denies any alcohol use.  Besides antibiotics and nebulizer treatments, and steroids, there is no new medications.  He denies any fevers, chills, chest pain, cough, hemoptysis, headache, neck pain, abdominal pain, hematemesis, hematuria.  He has had some shortness of breath and dyspnea on exertion. In the ED, the patient was afebrile and hemodynamically stable with oxygen saturation 97% room air.  WBC 4.6, hemoglobin 8.6, platelets 214.  Sodium 130, potassium 4.5, bicarb 24, serum creatinine 1.63.  LFTs were unremarkable.  FOBT was positive.  GI was consulted and recommended admission for further evaluation and treatment.   Assessment/Plan: Acute blood loss anemia/melena -Holding rivaroxaban--last dose was 10/30/2023 at 6 PM -continue  pantoprazole IV twice daily -GI consult appreciated -Clear liquid diet -Monitor H&H>>slight trend down to 7.8>>7.8 -11/02/23 EGD--Barrett's esophagus -11/02/23 colonoscopy--transv colon 2 polyps removed; desc colon one polyp removed; diverticulosis -11/03/23--planning capsule endo   CKD stage IIIb -Baseline creatinine 1.5-1.7 -Monitor BMP   Permanent atrial fibrillation -Rate controlled -Continue metoprolol succinate -Holding rivaroxaban   Uncontrolled diabetes mellitus type 2 with hyperglycemia -10/12/2023 hemoglobin A1c 8.0 -NovoLog sliding scale -No longer taking metformin -Holding Farxiga -CBGs controlled   Essential hypertension -Holding losartan -Continue metoprolol succinate -BP remains controlled   Mixed hyperlipidemia -Continue statin -Continue Zetia   BPH -Continue tamsulosin   Moderate Malnutrition -continue supplements             Family Communication:  son at bedside 11/19   Consultants:  GI   Code Status:  FULL    DVT Prophylaxis:  SCDs     Procedures: As Listed in Progress Note Above   Antibiotics: None          Subjective: Patient denies fevers, chills, headache, chest pain, dyspnea, nausea, vomiting, diarrhea, abdominal pain, dysuria, hematuria, hematochezia, and melena.   Objective: Vitals:   11/02/23 0818 11/02/23 1410 11/02/23 1506 11/02/23 1523  BP: 129/80 (!) 169/101 99/67 (!) 121/90  Pulse: (!) 102 99 67 93  Resp:  (!) 21 (!) 22 (!) 23  Temp:  98.3 F (36.8 C) 98.2 F (36.8 C) (!) 97.3 F (36.3 C)  TempSrc:  Oral    SpO2: 97% 99% 97% 98%  Weight:  88 kg    Height:  5\' 9"  (1.753 m)  Intake/Output Summary (Last 24 hours) at 11/02/2023 1537 Last data filed at 11/02/2023 1503 Gross per 24 hour  Intake 960 ml  Output --  Net 960 ml   Weight change:  Exam:  General:  Pt is alert, follows commands appropriately, not in acute distress HEENT: No icterus, No thrush, No neck mass, /AT Cardiovascular:  RRR, S1/S2, no rubs, no gallops Respiratory: CTA bilaterally, no wheezing, no crackles, no rhonchi Abdomen: Soft/+BS, non tender, non distended, no guarding Extremities: No edema, No lymphangitis, No petechiae, No rashes, no synovitis   Data Reviewed: I have personally reviewed following labs and imaging studies Basic Metabolic Panel: Recent Labs  Lab 10/31/23 0951 11/01/23 0437  NA 138 137  K 4.5 4.3  CL 105 104  CO2 24 27  GLUCOSE 220* 140*  BUN 22 15  CREATININE 1.63* 1.54*  CALCIUM 8.2* 8.6*   Liver Function Tests: Recent Labs  Lab 10/31/23 0951  AST 25  ALT 26  ALKPHOS 60  BILITOT 0.4  PROT 5.9*  ALBUMIN 3.0*   No results for input(s): "LIPASE", "AMYLASE" in the last 168 hours. No results for input(s): "AMMONIA" in the last 168 hours. Coagulation Profile: Recent Labs  Lab 10/31/23 0951  INR 1.0   CBC: Recent Labs  Lab 10/31/23 0951 11/01/23 0437 11/02/23 0412  WBC 5.6 6.7 4.0  NEUTROABS 4.5  --   --   HGB 8.6* 7.8* 7.8*  HCT 26.3* 23.8* 24.0*  MCV 104.4* 104.8* 103.9*  PLT 214 184 177   Cardiac Enzymes: No results for input(s): "CKTOTAL", "CKMB", "CKMBINDEX", "TROPONINI" in the last 168 hours. BNP: Invalid input(s): "POCBNP" CBG: Recent Labs  Lab 11/01/23 1602 11/01/23 1637 11/02/23 0735 11/02/23 1141 11/02/23 1357  GLUCAP 68* 88 133* 143* 134*   HbA1C: No results for input(s): "HGBA1C" in the last 72 hours. Urine analysis:    Component Value Date/Time   COLORURINE YELLOW 07/12/2007 1730   APPEARANCEUR CLEAR 07/12/2007 1730   LABSPEC 1.014 07/12/2007 1730   PHURINE 7.5 07/12/2007 1730   GLUCOSEU NEGATIVE 07/12/2007 1730   HGBUR NEGATIVE 07/12/2007 1730   BILIRUBINUR NEGATIVE 07/12/2007 1730   KETONESUR NEGATIVE 07/12/2007 1730   PROTEINUR NEGATIVE 07/12/2007 1730   UROBILINOGEN 0.2 07/12/2007 1730   NITRITE NEGATIVE 07/12/2007 1730   LEUKOCYTESUR  07/12/2007 1730    NEGATIVE MICROSCOPIC NOT DONE ON URINES WITH NEGATIVE PROTEIN,  BLOOD, LEUKOCYTES, NITRITE, OR GLUCOSE <1000 mg/dL.   Sepsis Labs: @LABRCNTIP (procalcitonin:4,lacticidven:4) )No results found for this or any previous visit (from the past 240 hour(s)).   Scheduled Meds:  atorvastatin  20 mg Oral QHS   ezetimibe  10 mg Oral Daily   feeding supplement  1 Container Oral TID BM   insulin aspart  0-9 Units Subcutaneous TID WC   ipratropium-albuterol  3 mL Nebulization BID   loratadine  10 mg Oral QHS   melatonin  4.5 mg Oral QHS   metoprolol succinate  12.5 mg Oral Daily   multivitamin with minerals  1 tablet Oral Daily   pantoprazole (PROTONIX) IV  40 mg Intravenous Q12H   tamsulosin  0.4 mg Oral QPC supper   traZODone  50 mg Oral QHS   Continuous Infusions:  Procedures/Studies: US Venous Img Lower Bilateral (DVT)  Result Date: 11/01/2023 CLINICAL DATA:  Bilateral lower extremity edema.  Evaluate for DVT. EXAM: BILATERAL LOWER EXTREMITY VENOUS DOPPLER ULTRASOUND TECHNIQUE: Gray-scale sonography with graded compression, as well as color Doppler and duplex ultrasound were performed to evaluate the lower extremity deep venous  systems from the level of the common femoral vein and including the common femoral, femoral, profunda femoral, popliteal and calf veins including the posterior tibial, peroneal and gastrocnemius veins when visible. The superficial great saphenous vein was also interrogated. Spectral Doppler was utilized to evaluate flow at rest and with distal augmentation maneuvers in the common femoral, femoral and popliteal veins. COMPARISON:  None Available. FINDINGS: RIGHT LOWER EXTREMITY Common Femoral Vein: No evidence of thrombus. Normal compressibility, respiratory phasicity and response to augmentation. Saphenofemoral Junction: No evidence of thrombus. Normal compressibility and flow on color Doppler imaging. Profunda Femoral Vein: No evidence of thrombus. Normal compressibility and flow on color Doppler imaging. Femoral Vein: No evidence of  thrombus. Normal compressibility, respiratory phasicity and response to augmentation. Popliteal Vein: No evidence of thrombus. Normal compressibility, respiratory phasicity and response to augmentation. Calf Veins: No evidence of thrombus. Normal compressibility and flow on color Doppler imaging. Superficial Great Saphenous Vein: No evidence of thrombus. Normal compressibility. Other Findings:  None. LEFT LOWER EXTREMITY Common Femoral Vein: No evidence of thrombus. Normal compressibility, respiratory phasicity and response to augmentation. Saphenofemoral Junction: No evidence of thrombus. Normal compressibility and flow on color Doppler imaging. Profunda Femoral Vein: No evidence of thrombus. Normal compressibility and flow on color Doppler imaging. Femoral Vein: No evidence of thrombus. Normal compressibility, respiratory phasicity and response to augmentation. Popliteal Vein: No evidence of thrombus. Normal compressibility, respiratory phasicity and response to augmentation. Calf Veins: No evidence of thrombus. Normal compressibility and flow on color Doppler imaging. Superficial Great Saphenous Vein: No evidence of thrombus. Normal compressibility. Other Findings: There is a minimal amount of subcutaneous edema at the level of the left calf. IMPRESSION: No evidence of DVT within either lower extremity. Electronically Signed   By: Simonne Come M.D.   On: 11/01/2023 16:58   CT HEAD W & WO CONTRAST ( )  Result Date: 10/29/2023 CLINICAL DATA:  Confusion with dizziness, history of basal cell EXAM: CT HEAD WITHOUT AND WITH CONTRAST TECHNIQUE: Contiguous axial images were obtained from the base of the skull through the vertex without and with intravenous contrast. RADIATION DOSE REDUCTION: This exam was performed according to the departmental dose-optimization program which includes automated exposure control, adjustment of the mA and/or kV according to patient size and/or use of iterative reconstruction  technique. CONTRAST:  75mL ISOVUE-300 IOPAMIDOL (ISOVUE-300) INJECTION 61% COMPARISON:  None Available. FINDINGS: Brain: No abnormal parenchymal or meningeal enhancement. No evidence of acute infarction, hemorrhage, mass, mass effect, or midline shift. No hydrocephalus or extra-axial fluid collection. Periventricular white matter changes, likely the sequela of chronic small vessel ischemic disease. Vascular: No hyperdense vessel on precontrast images. Grossly normal arterial and venous enhancement Skull: Negative for fracture or focal lesion. Sinuses/Orbits: Minimal mucosal thickening in the paranasal sinuses. No acute finding in the orbits. Other: The mastoid air cells are well aerated. IMPRESSION: No acute intracranial process. Electronically Signed   By: Wiliam Ke M.D.   On: 10/29/2023 11:13   CT Chest W Contrast  Result Date: 10/12/2023 CLINICAL DATA:  Cough, chronic, persisting greater than 8 weeks, failed empiric treatment EXAM: CT CHEST WITH CONTRAST TECHNIQUE: Multidetector CT imaging of the chest was performed during intravenous contrast administration. RADIATION DOSE REDUCTION: This exam was performed according to the departmental dose-optimization program which includes automated exposure control, adjustment of the mA and/or kV according to patient size and/or use of iterative reconstruction technique. CONTRAST:  75mL OMNIPAQUE IOHEXOL 300 MG/ML  SOLN COMPARISON:  Radiographs earlier today FINDINGS: Cardiovascular: Normal heart size.  No pericardial effusion. Coronary artery and aortic atherosclerotic calcification. Mediastinum/Nodes: Trachea and esophagus are unremarkable. Borderline mediastinal and left hilar adenopathy. For example 1.1 cm pretracheal lymph node on 2/54 and 1.1 cm left hilar lymph node on 2/71. Lungs/Pleura: Bilateral peripheral and peribronchovascular predominant consolidation and ground-glass opacities greatest in the left lower lobe. Small cyst with cavitation in the  posterior left lower lobe (3/88). No pleural effusion or pneumothorax. Upper Abdomen: No acute abnormality. Musculoskeletal: No acute fracture. IMPRESSION: Multifocal airspace opacities. This may represent multifocal pneumonia. In the setting of chronic symptoms, differential considerations include cryptogenic organizing pneumonia and chronic eosinophilic pneumonia. Aortic Atherosclerosis (ICD10-I70.0). Electronically Signed   By: Minerva Fester M.D.   On: 10/12/2023 03:54   DG Chest 2 View  Result Date: 10/12/2023 CLINICAL DATA:  Cough, weight loss EXAM: CHEST - 2 VIEW COMPARISON:  08/26/2006 FINDINGS: The basilar airspace opacities compatible with pneumonia. Suspected small left pleural effusion. No pneumothorax. Normal cardiomediastinal silhouette. IMPRESSION: Left lower lobe pneumonia. Follow-up in 6-8 weeks after treatment is recommended to ensure resolution. Electronically Signed   By: Minerva Fester M.D.   On: 10/12/2023 00:19    Catarina Hartshorn, DO  [facility]  If 7PM-7AM, please contact night-coverage [email or web address] Password [email or web address] 11/02/2023, 3:37 PM   LOS: 2 days

## 2023-11-02 NOTE — Plan of Care (Signed)

## 2023-11-02 NOTE — Anesthesia Procedure Notes (Signed)
Date/Time: 11/02/2023 2:21 PM  Performed by: Franco Nones, CRNAPre-anesthesia Checklist: Patient identified, Emergency Drugs available, Suction available, Timeout performed and Patient being monitored Patient Re-evaluated:Patient Re-evaluated prior to induction Oxygen Delivery Method: Nasal cannula Comments: Optiflow

## 2023-11-02 NOTE — Op Note (Signed)
[facility] Patient Name: Max Davenport Procedure Date: 11/02/2023 2:05 PM MRN: [id number] Date of Birth: [birth year] Attending MD: Katrinka Blazing , , [id number] CSN: [id number] Age: 77 Admit Type: Outpatient Procedure:                Colonoscopy Indications:              Rectal bleeding, Iron deficiency anemia Providers:                Katrinka Blazing, Francoise Ceo RN, RN, Sheran Fava Referring MD:              Medicines:                Monitored Anesthesia Care Complications:            No immediate complications. Estimated Blood Loss:     Estimated blood loss: none. Procedure:                Pre-Anesthesia Assessment:                           - Prior to the procedure, a History and Physical                            was performed, and patient medications, allergies                            and sensitivities were reviewed. The patient's                            tolerance of previous anesthesia was reviewed.                           - The risks and benefits of the procedure and the                            sedation options and risks were discussed with the                            patient. All questions were answered and informed                            consent was obtained.                           - ASA Grade Assessment: III - A patient with severe                            systemic disease.                           After obtaining informed consent, the colonoscope                            was passed under direct vision. Throughout the  procedure, the patient's blood pressure, pulse, and                            oxygen saturations were monitored continuously. The                            [id number]-[id number] ([id number]) scope was introduced through                            the anus and advanced to the the terminal ileum.                            The colonoscopy was performed without difficulty.                             The patient tolerated the procedure well. The                            quality of the bowel preparation was excellent. Scope In: 2:39:02 PM Scope Out: 2:59:37 PM Scope Withdrawal Time: 0 hours 14 minutes 50 seconds  Total Procedure Duration: 0 hours 20 minutes 35 seconds  Findings:      The perianal and digital rectal examinations were normal.      The terminal ileum appeared normal.      Two sessile polyps were found in the transverse colon. The polyps were 3       to 4 mm in size. These polyps were removed with a cold snare. Resection       and retrieval were complete.      A 3 mm polyp was found in the descending colon. The polyp was sessile.       The polyp was removed with a cold snare. Resection and retrieval were       complete.      Scattered medium-mouthed and small-mouthed diverticula were found in the       sigmoid colon and descending colon.      The retroflexed view of the distal rectum and anal verge was normal and       showed no anal or rectal abnormalities. Impression:               - The examined portion of the ileum was normal.                           - Two 3 to 4 mm polyps in the transverse colon,                            removed with a cold snare. Resected and retrieved.                           - One 3 mm polyp in the descending colon, removed                            with a cold snare. Resected and retrieved.                           -  Diverticulosis in the sigmoid colon and in the                            descending colon.                           - The distal rectum and anal verge are normal on                            retroflexion view. Moderate Sedation:      Per Anesthesia Care Recommendation:           - Await pathology results.                           - Repeat colonoscopy for surveillance based on                            pathology results.                           - Return patient to hospital ward for ongoing  care.                           - Clear liquid diet today. NPO after MN.                           - Proceed with capsule endoscopy tomorrow. Procedure Code(s):        --- Professional ---                           [phone], Colonoscopy, flexible; with removal of                            tumor(s), polyp(s), or other lesion(s) by snare                            technique Diagnosis Code(s):        --- Professional ---                           D12.3, Benign neoplasm of transverse colon (hepatic                            flexure or splenic flexure)                           D12.4, Benign neoplasm of descending colon                           K62.5, Hemorrhage of anus and rectum                           D50.9, Iron deficiency anemia, unspecified                           K57.30, Diverticulosis of large intestine without  perforation or abscess without bleeding CPT copyright 2022 American Medical Association. All rights reserved. The codes documented in this report are preliminary and upon coder review may  be revised to meet current compliance requirements. Katrinka Blazing, MD Katrinka Blazing,  11/02/2023 3:10:14 PM This report has been signed electronically. Number of Addenda: 0

## 2023-11-02 NOTE — Progress Notes (Signed)
We will proceed with Egd and colonoscopy as scheduled.  I thoroughly discussed with the patient the procedure, including the risks involved. Patient understands what the procedure involves including the benefits and any risks. Patient understands alternatives to the proposed procedure. Risks including (but not limited to) bleeding, tearing of the lining (perforation), rupture of adjacent organs, problems with heart and lung function, infection, and medication reactions. A small percentage of complications may require surgery, hospitalization, repeat endoscopic procedure, and/or transfusion.  Patient understood and agreed.  Katrinka Blazing, MD Gastroenterology and Hepatology [facility] Gastroenterology

## 2023-11-02 NOTE — Op Note (Signed)
[facility] Patient Name: Max Davenport Procedure Date: 11/02/2023 2:06 PM MRN: [id number] Date of Birth: [birth year] Attending MD: Katrinka Blazing , , [id number] CSN: [id number] Age: 77 Admit Type: Outpatient Procedure:                Upper GI endoscopy Indications:              Iron deficiency anemia, Melena Providers:                Katrinka Blazing, Edrick Kins, RN, Francoise Ceo RN,                            RN, Dyann Ruddle Referring MD:              Medicines:                Monitored Anesthesia Care Complications:            No immediate complications. Estimated Blood Loss:     Estimated blood loss: none. Procedure:                Pre-Anesthesia Assessment:                           - Prior to the procedure, a History and Physical                            was performed, and patient medications, allergies                            and sensitivities were reviewed. The patient's                            tolerance of previous anesthesia was reviewed.                           - The risks and benefits of the procedure and the                            sedation options and risks were discussed with the                            patient. All questions were answered and informed                            consent was obtained.                           - ASA Grade Assessment: III - A patient with severe                            systemic disease.                           After obtaining informed consent, the endoscope was                            passed under direct vision. Throughout  the                            procedure, the patient's blood pressure, pulse, and                            oxygen saturations were monitored continuously. The                            [id number]-[id number] ([id number]) scope was introduced through the                            mouth, and advanced to the third part of duodenum.                            The upper GI endoscopy was accomplished  without                            difficulty. The patient tolerated the procedure                            well. Scope In: 2:28:11 PM Scope Out: 2:33:11 PM Total Procedure Duration: 0 hours 5 minutes 0 seconds  Findings:      The esophagus and gastroesophageal junction were examined with white       light and narrow band imaging (NBI). There were esophageal mucosal       changes classified as Barrett's stage C0-M2 per Prague criteria. These       changes involved the mucosa at the upper extent of the gastric folds (39       cm from the incisors) extending to the Z-line (37 cm from the incisors).       Two tongues of salmon-colored mucosa were present from 37 to 39 cm. The       maximum longitudinal extent of these esophageal mucosal changes was 2 cm       in length. This was biopsied with a cold forceps for histology.      A 2 cm hiatal hernia was present.      The stomach was normal.      The examined duodenum was normal. Impression:               - Esophageal mucosal changes classified as                            Barrett's stage C0-M2 per Prague criteria. Biopsied.                           - 2 cm hiatal hernia.                           - Normal stomach.                           - Normal examined duodenum. Moderate Sedation:      Per Anesthesia Care Recommendation:           - Await pathology results.                           -  Proceed with colonoscopy. Procedure Code(s):        --- Professional ---                           [phone], Esophagogastroduodenoscopy, flexible,                            transoral; with biopsy, single or multiple Diagnosis Code(s):        --- Professional ---                           K22.70, Barrett's esophagus without dysplasia                           K44.9, Diaphragmatic hernia without obstruction or                            gangrene                           D50.9, Iron deficiency anemia, unspecified                           K92.1, Melena  (includes Hematochezia) CPT copyright 2022 American Medical Association. All rights reserved. The codes documented in this report are preliminary and upon coder review may  be revised to meet current compliance requirements. Katrinka Blazing, MD Katrinka Blazing,  11/02/2023 3:02:01 PM This report has been signed electronically. Number of Addenda: 0

## 2023-11-02 NOTE — Progress Notes (Signed)
Mobility Specialist Progress Note:    11/02/23 1200  Mobility  Activity Ambulated with assistance in hallway  Level of Assistance Standby assist, set-up cues, supervision of patient - no hands on  Assistive Device None  Distance Ambulated (ft) 100 ft  Range of Motion/Exercises Active;All extremities  Activity Response Tolerated well  Mobility Referral Yes  $Mobility charge 1 Mobility  Mobility Specialist Start Time (ACUTE ONLY) 1115  Mobility Specialist Stop Time (ACUTE ONLY) 1130  Mobility Specialist Time Calculation (min) (ACUTE ONLY) 15 min   Pt received in chair, agreeable to mobility. Required SBA to stand and ambulate with no AD. Tolerated well, asx throughout. Returned pt to room, left in chair. All needs met.   Lawerance Bach Mobility Specialist Please contact via Special educational needs teacher or  Rehab office at [phone]

## 2023-11-02 NOTE — Progress Notes (Signed)
Pt stools clear at 2300 with blood stains, no solids. Also clear at 0545.

## 2023-11-02 NOTE — Transfer of Care (Signed)
Immediate Anesthesia Transfer of Care Note  Patient: Max Davenport  Procedure(s) Performed: COLONOSCOPY WITH PROPOFOL ESOPHAGOGASTRODUODENOSCOPY (EGD) WITH PROPOFOL BIOPSY  Patient Location: PACU  Anesthesia Type:General  Level of Consciousness: awake and patient cooperative  Airway & Oxygen Therapy: Patient Spontanous Breathing  Post-op Assessment: Report given to RN and Post -op Vital signs reviewed and stable  Post vital signs: Reviewed and stable  Last Vitals:  Vitals Value Taken Time  BP 99/67 11/02/23  1507  Temp 98.2 11/02/23   1507  Pulse 67 11/02/23 1505  Resp 22 11/02/23 1506  SpO2 79 % 11/02/23 1505  Vitals shown include unfiled device data.  Last Pain:  Vitals:   11/02/23 1422  TempSrc:   PainSc: 0-No pain         Complications: No notable events documented.

## 2023-11-03 ENCOUNTER — Encounter (HOSPITAL_COMMUNITY)
Admission: EM | Disposition: A | Discharge status: Home / Self Care | Payer: Self-pay | Source: Home / Self Care | Attending: Internal Medicine

## 2023-11-03 ENCOUNTER — Encounter (HOSPITAL_COMMUNITY): Payer: Self-pay | Admitting: Internal Medicine

## 2023-11-03 DIAGNOSIS — N400 Enlarged prostate without lower urinary tract symptoms: Secondary | ICD-10-CM

## 2023-11-03 DIAGNOSIS — D62 Acute posthemorrhagic anemia: Secondary | ICD-10-CM | POA: Diagnosis not present

## 2023-11-03 DIAGNOSIS — I10 Essential (primary) hypertension: Secondary | ICD-10-CM

## 2023-11-03 DIAGNOSIS — E440 Moderate protein-calorie malnutrition: Secondary | ICD-10-CM | POA: Diagnosis not present

## 2023-11-03 DIAGNOSIS — N1832 Chronic kidney disease, stage 3b: Secondary | ICD-10-CM | POA: Diagnosis not present

## 2023-11-03 HISTORY — PX: GIVENS CAPSULE STUDY: SHX5432

## 2023-11-03 LAB — BASIC METABOLIC PANEL
Anion gap: 9 (ref 5–15)
BUN: 8 mg/dL (ref 8–23)
CO2: 25 mmol/L (ref 22–32)
Calcium: 8.5 mg/dL — ABNORMAL LOW (ref 8.9–10.3)
Chloride: 103 mmol/L (ref 98–111)
Creatinine, Ser: 1.54 mg/dL — ABNORMAL HIGH (ref 0.61–1.24)
GFR, Estimated: 46 mL/min — ABNORMAL LOW (ref >60)
Glucose, Bld: 155 mg/dL — ABNORMAL HIGH (ref 70–99)
Potassium: 4.2 mmol/L (ref 3.5–5.1)
Sodium: 137 mmol/L (ref 135–145)

## 2023-11-03 LAB — CBC
HCT: 24.8 % — ABNORMAL LOW (ref 39.0–52.0)
Hemoglobin: 8.2 g/dL — ABNORMAL LOW (ref 13.0–17.0)
MCH: 34.5 pg — ABNORMAL HIGH (ref 26.0–34.0)
MCHC: 33.1 g/dL (ref 30.0–36.0)
MCV: 104.2 fL — ABNORMAL HIGH (ref 80.0–100.0)
Platelets: 193 10*3/uL (ref 150–400)
RBC: 2.38 MIL/uL — ABNORMAL LOW (ref 4.22–5.81)
RDW: 16.7 % — ABNORMAL HIGH (ref 11.5–15.5)
WBC: 4.9 10*3/uL (ref 4.0–10.5)
nRBC: 0 % (ref 0.0–0.2)

## 2023-11-03 LAB — GLUCOSE, CAPILLARY
Glucose-Capillary: 136 mg/dL — ABNORMAL HIGH (ref 70–99)
Glucose-Capillary: 125 mg/dL — ABNORMAL HIGH (ref 70–99)
Glucose-Capillary: 101 mg/dL — ABNORMAL HIGH (ref 70–99)

## 2023-11-03 SURGERY — IMAGING PROCEDURE, GI TRACT, INTRALUMINAL, VIA CAPSULE

## 2023-11-03 MED ORDER — FLUTICASONE PROPIONATE 50 MCG/ACT NA SUSP
1.0000 | Freq: Every day | NASAL | Status: DC
  Administered 2023-11-03 – 2023-11-04 (×2): 1 via NASAL
  Filled 2023-11-03: qty 16

## 2023-11-03 NOTE — Progress Notes (Signed)
Briefly, patient with admission for GI bleed, EGD and Colonoscopy on 11/19 with 2cm hiatal hernia, esophageal mucosal changes classified as Barrett's stage C0-M2 prague criteria, 2 transverse colon polyps, 1 descending colon polyp, diverticulosis in sigmoid and descending colon, recommended to have givens capsule study placed this morning.   Hemoglobin is stable at 8.2 this morning, up from 7.8 yesterday   Patient feeling okay, tolerated swallowing of capsule this morning. He had a BM this morning. Denies BRB, stool appeared somewhat dark, reports BM was very small. Denies abdominal pain.  Patient may eat a light lunch later today.   Given's capsule study to be read by our team tomorrow.   Momin Misko L. Jeanmarie Hubert, MSN, APRN, AGNP-C Adult-Gerontology Nurse Practitioner [facility]

## 2023-11-03 NOTE — Progress Notes (Signed)
Mobility Specialist Progress Note:    11/03/23 1400  Mobility  Activity Ambulated with assistance in hallway  Level of Assistance Standby assist, set-up cues, supervision of patient - no hands on  Assistive Device None  Distance Ambulated (ft) 300 ft  Range of Motion/Exercises Active;All extremities  Activity Response Tolerated well  Mobility Referral Yes  $Mobility charge 1 Mobility  Mobility Specialist Start Time (ACUTE ONLY) 1345  Mobility Specialist Stop Time (ACUTE ONLY) 1400  Mobility Specialist Time Calculation (min) (ACUTE ONLY) 15 min   Pt received in bed, eager for mobility. Required supervision to stand and ambulate with no AD. Tolerated well, asx throughout. Returned to room, left pt in chair. All needs met.   Lawerance Bach Mobility Specialist Please contact via Special educational needs teacher or  Rehab office at [phone]

## 2023-11-03 NOTE — Plan of Care (Signed)
  Problem: Clinical Measurements: Goal: Will remain free from infection Outcome: Progressing Goal: Diagnostic test results will improve Outcome: Progressing   Problem: Activity: Goal: Risk for activity intolerance will decrease Outcome: Progressing   Problem: Nutrition: Goal: Adequate nutrition will be maintained Outcome: Progressing   Problem: Education: Goal: Knowledge of General Education information will improve Description: Including pain rating scale, medication(s)/side effects and non-pharmacologic comfort measures Outcome: Progressing

## 2023-11-03 NOTE — Progress Notes (Signed)
PROGRESS NOTE  Max Davenport ZOX:[id number] DOB: 1946/04/28 DOA: 10/31/2023 PCP: Benita Stabile, MD  Brief admission history:  As per H&P written by Dr. Onalee Hua TAT on 10/31/2023 Max Davenport is a 77 year old male with a history of permanent atrial fibrillation on Xarelto, hypertension, hyperlipidemia, CKD stage III, diabetes mellitus type 2, BPH, basal cell skin cancer, and chronic dizziness presenting with malaise and generalized weakness for the past week.  The patient was recently mated to the hospital from 10/03/2023 to 10/14/2023 at which time the patient was treated for pneumonia and COVID-19.  He was discharged home on antibiotics and steroids and nebulizers.  He states that his breathing has gradually improved since the time of discharge.  However he continues to have malaise and some dizziness.  He has followed up with his PCP.  Routine blood work on 10/26/2023 showed hemoglobin 9.8.  He had a repeat CBC on 10/29/2023 and his hemoglobin dropped to 9.0.  Notably, the patient had a hemoglobin of 14.4 on the day of discharge on 10/14/2023. The patient states that he has been having melanotic stool on a daily basis.  He did have 1 episode of hematochezia when he was straining to have a bowel movement.  He denies any NSAIDs.  He denies any alcohol use.  Besides antibiotics and nebulizer treatments, and steroids, there is no new medications.  He denies any fevers, chills, chest pain, cough, hemoptysis, headache, neck pain, abdominal pain, hematemesis, hematuria.  He has had some shortness of breath and dyspnea on exertion. In the ED, the patient was afebrile and hemodynamically stable with oxygen saturation 97% room air.  WBC 4.6, hemoglobin 8.6, platelets 214.  Sodium 130, potassium 4.5, bicarb 24, serum creatinine 1.63.  LFTs were unremarkable.  FOBT was positive.  GI was consulted and recommended admission for further evaluation and treatment.  Assessment/Plan: Acute blood loss  anemia/melena -Holding rivaroxaban--last dose was 10/30/2023 at 6 PM -Continue PPI. -Appreciate assistance and recommendation by GI service. -11/02/23 EGD--Barrett's esophagus -11/02/23 colonoscopy--transv colon 2 polyps removed; desc colon one polyp removed; diverticulosis -11/03/23--will follow GI service recommendations and results from capsule endoscopy. -Advance diet as per GI recommendations. -Hemoglobin stable and is slightly up to 8.2 this morning. -No overt bleeding currently appreciated.   CKD stage IIIb -Baseline creatinine 1.5-1.7 -Appears stable and at baseline -Continue to follow renal function trend.   Permanent atrial fibrillation -Rate controlled -Continue the use of metoprolol -Continue holding Xarelto while starting acute blood loss anemia and concern for GI bleed.   Uncontrolled diabetes mellitus type 2 with hyperglycemia -10/12/2023 hemoglobin A1c 8.0 -Continue NovoLog sliding scale -No longer taking metformin -Continue holding Farxiga -Follow CBGs fluctuation and adjust hypoglycemic regimen as needed.   Essential hypertension -Continue holding losartan -Continue metoprolol succinate -BP remains controlled and is stable -Follow vital signs.   Mixed hyperlipidemia -Continue statin -Continue Zetia -Heart healthy diet discussed with patient.   BPH -Continue tamsulosin -No complaints of urinary retention.   Moderate Malnutrition -continue supplements -Adequate nutrition discussed with patient.          Family Communication:  son at bedside 11/19   Consultants:  GI   Code Status:  FULL    DVT Prophylaxis: Continue SCDs     Procedures: As Listed in Progress Note Above   Antibiotics: None    Subjective: Afebrile, no chest pain, no nausea vomiting.  Reporting some nasal congestion.  Patient have a bowel movement this  morning; reports stool appears somewhat dark but there was no bright red blood per rectum.  He is  hungry.  Objective: Vitals:   11/02/23 1926 11/02/23 2053 11/03/23 0751 11/03/23 1504  BP:  (!) 143/93  (!) 139/113  Pulse:  93  (!) 103  Resp:  20  18  Temp:  98.2 F (36.8 C)  98.6 F (37 C)  TempSrc:  Oral  Oral  SpO2: 95% 99% 99% 99%  Weight:      Height:        Intake/Output Summary (Last 24 hours) at 11/03/2023 1814 Last data filed at 11/03/2023 0300 Gross per 24 hour  Intake 0 ml  Output --  Net 0 ml   Weight change:   Exam: General exam: Alert, awake, oriented x 3; afebrile, in no acute distress. Respiratory system: Clear to auscultation. Respiratory effort normal.  Good saturation on room air. Cardiovascular system:RRR. No rubs or gallops; no JVD. Gastrointestinal system: Abdomen is nondistended, soft and nontender. No organomegaly or masses felt. Normal bowel sounds heard. Central nervous system:  No focal neurological deficits. Extremities: No cyanosis or clubbing.  No edema appreciated. Skin: No rashes, no petechiae. Psychiatry: Judgement and insight appear normal. Mood & affect appropriate.    Data Reviewed: I have personally reviewed following labs and imaging studies  Basic Metabolic Panel: Recent Labs  Lab 10/31/23 0951 11/01/23 0437 11/03/23 0420  NA 138 137 137  K 4.5 4.3 4.2  CL 105 104 103  CO2 24 27 25   GLUCOSE 220* 140* 155*  BUN 22 15 8   CREATININE 1.63* 1.54* 1.54*  CALCIUM 8.2* 8.6* 8.5*   Liver Function Tests: Recent Labs  Lab 10/31/23 0951  AST 25  ALT 26  ALKPHOS 60  BILITOT 0.4  PROT 5.9*  ALBUMIN 3.0*   Coagulation Profile: Recent Labs  Lab 10/31/23 0951  INR 1.0   CBC: Recent Labs  Lab 10/31/23 0951 11/01/23 0437 11/02/23 0412 11/03/23 0420  WBC 5.6 6.7 4.0 4.9  NEUTROABS 4.5  --   --   --   HGB 8.6* 7.8* 7.8* 8.2*  HCT 26.3* 23.8* 24.0* 24.8*  MCV 104.4* 104.8* 103.9* 104.2*  PLT 214 184 177 193   CBG: Recent Labs  Lab 11/02/23 1357 11/02/23 1601 11/02/23 2055 11/03/23 0737 11/03/23 1111   GLUCAP 134* 146* 119* 136* 125*   Urine analysis:    Component Value Date/Time   COLORURINE YELLOW 07/12/2007 1730   APPEARANCEUR CLEAR 07/12/2007 1730   LABSPEC 1.014 07/12/2007 1730   PHURINE 7.5 07/12/2007 1730   GLUCOSEU NEGATIVE 07/12/2007 1730   HGBUR NEGATIVE 07/12/2007 1730   BILIRUBINUR NEGATIVE 07/12/2007 1730   KETONESUR NEGATIVE 07/12/2007 1730   PROTEINUR NEGATIVE 07/12/2007 1730   UROBILINOGEN 0.2 07/12/2007 1730   NITRITE NEGATIVE 07/12/2007 1730   LEUKOCYTESUR  07/12/2007 1730    NEGATIVE MICROSCOPIC NOT DONE ON URINES WITH NEGATIVE PROTEIN, BLOOD, LEUKOCYTES, NITRITE, OR GLUCOSE <1000 mg/dL.   Scheduled Meds:  atorvastatin  20 mg Oral QHS   ezetimibe  10 mg Oral Daily   feeding supplement  1 Container Oral TID BM   fluticasone  1 spray Each Nare Daily   insulin aspart  0-9 Units Subcutaneous TID WC   ipratropium-albuterol  3 mL Nebulization BID   loratadine  10 mg Oral QHS   melatonin  4.5 mg Oral QHS   metoprolol succinate  12.5 mg Oral Daily   multivitamin with minerals  1 tablet Oral Daily   pantoprazole (PROTONIX)  IV  40 mg Intravenous Q12H   tamsulosin  0.4 mg Oral QPC supper   traZODone  50 mg Oral QHS   Continuous Infusions:  Procedures/Studies: US Venous Img Lower Bilateral (DVT)  Result Date: 11/01/2023 CLINICAL DATA:  Bilateral lower extremity edema.  Evaluate for DVT. EXAM: BILATERAL LOWER EXTREMITY VENOUS DOPPLER ULTRASOUND TECHNIQUE: Gray-scale sonography with graded compression, as well as color Doppler and duplex ultrasound were performed to evaluate the lower extremity deep venous systems from the level of the common femoral vein and including the common femoral, femoral, profunda femoral, popliteal and calf veins including the posterior tibial, peroneal and gastrocnemius veins when visible. The superficial great saphenous vein was also interrogated. Spectral Doppler was utilized to evaluate flow at rest and with distal augmentation maneuvers  in the common femoral, femoral and popliteal veins. COMPARISON:  None Available. FINDINGS: RIGHT LOWER EXTREMITY Common Femoral Vein: No evidence of thrombus. Normal compressibility, respiratory phasicity and response to augmentation. Saphenofemoral Junction: No evidence of thrombus. Normal compressibility and flow on color Doppler imaging. Profunda Femoral Vein: No evidence of thrombus. Normal compressibility and flow on color Doppler imaging. Femoral Vein: No evidence of thrombus. Normal compressibility, respiratory phasicity and response to augmentation. Popliteal Vein: No evidence of thrombus. Normal compressibility, respiratory phasicity and response to augmentation. Calf Veins: No evidence of thrombus. Normal compressibility and flow on color Doppler imaging. Superficial Great Saphenous Vein: No evidence of thrombus. Normal compressibility. Other Findings:  None. LEFT LOWER EXTREMITY Common Femoral Vein: No evidence of thrombus. Normal compressibility, respiratory phasicity and response to augmentation. Saphenofemoral Junction: No evidence of thrombus. Normal compressibility and flow on color Doppler imaging. Profunda Femoral Vein: No evidence of thrombus. Normal compressibility and flow on color Doppler imaging. Femoral Vein: No evidence of thrombus. Normal compressibility, respiratory phasicity and response to augmentation. Popliteal Vein: No evidence of thrombus. Normal compressibility, respiratory phasicity and response to augmentation. Calf Veins: No evidence of thrombus. Normal compressibility and flow on color Doppler imaging. Superficial Great Saphenous Vein: No evidence of thrombus. Normal compressibility. Other Findings: There is a minimal amount of subcutaneous edema at the level of the left calf. IMPRESSION: No evidence of DVT within either lower extremity. Electronically Signed   By: Simonne Come M.D.   On: 11/01/2023 16:58   CT HEAD W & WO CONTRAST ( )  Result Date: 10/29/2023 CLINICAL  DATA:  Confusion with dizziness, history of basal cell EXAM: CT HEAD WITHOUT AND WITH CONTRAST TECHNIQUE: Contiguous axial images were obtained from the base of the skull through the vertex without and with intravenous contrast. RADIATION DOSE REDUCTION: This exam was performed according to the departmental dose-optimization program which includes automated exposure control, adjustment of the mA and/or kV according to patient size and/or use of iterative reconstruction technique. CONTRAST:  75mL ISOVUE-300 IOPAMIDOL (ISOVUE-300) INJECTION 61% COMPARISON:  None Available. FINDINGS: Brain: No abnormal parenchymal or meningeal enhancement. No evidence of acute infarction, hemorrhage, mass, mass effect, or midline shift. No hydrocephalus or extra-axial fluid collection. Periventricular white matter changes, likely the sequela of chronic small vessel ischemic disease. Vascular: No hyperdense vessel on precontrast images. Grossly normal arterial and venous enhancement Skull: Negative for fracture or focal lesion. Sinuses/Orbits: Minimal mucosal thickening in the paranasal sinuses. No acute finding in the orbits. Other: The mastoid air cells are well aerated. IMPRESSION: No acute intracranial process. Electronically Signed   By: Wiliam Ke M.D.   On: 10/29/2023 11:13   CT Chest W Contrast  Result Date: 10/12/2023 CLINICAL DATA:  Cough,  chronic, persisting greater than 8 weeks, failed empiric treatment EXAM: CT CHEST WITH CONTRAST TECHNIQUE: Multidetector CT imaging of the chest was performed during intravenous contrast administration. RADIATION DOSE REDUCTION: This exam was performed according to the departmental dose-optimization program which includes automated exposure control, adjustment of the mA and/or kV according to patient size and/or use of iterative reconstruction technique. CONTRAST:  75mL OMNIPAQUE IOHEXOL 300 MG/ML  SOLN COMPARISON:  Radiographs earlier today FINDINGS: Cardiovascular: Normal heart  size. No pericardial effusion. Coronary artery and aortic atherosclerotic calcification. Mediastinum/Nodes: Trachea and esophagus are unremarkable. Borderline mediastinal and left hilar adenopathy. For example 1.1 cm pretracheal lymph node on 2/54 and 1.1 cm left hilar lymph node on 2/71. Lungs/Pleura: Bilateral peripheral and peribronchovascular predominant consolidation and ground-glass opacities greatest in the left lower lobe. Small cyst with cavitation in the posterior left lower lobe (3/88). No pleural effusion or pneumothorax. Upper Abdomen: No acute abnormality. Musculoskeletal: No acute fracture. IMPRESSION: Multifocal airspace opacities. This may represent multifocal pneumonia. In the setting of chronic symptoms, differential considerations include cryptogenic organizing pneumonia and chronic eosinophilic pneumonia. Aortic Atherosclerosis (ICD10-I70.0). Electronically Signed   By: Minerva Fester M.D.   On: 10/12/2023 03:54   DG Chest 2 View  Result Date: 10/12/2023 CLINICAL DATA:  Cough, weight loss EXAM: CHEST - 2 VIEW COMPARISON:  08/26/2006 FINDINGS: The basilar airspace opacities compatible with pneumonia. Suspected small left pleural effusion. No pneumothorax. Normal cardiomediastinal silhouette. IMPRESSION: Left lower lobe pneumonia. Follow-up in 6-8 weeks after treatment is recommended to ensure resolution. Electronically Signed   By: Minerva Fester M.D.   On: 10/12/2023 00:19    Vassie Loll, MD  [facility]  If 7PM-7AM, please contact night-coverage [email or web address] Password [email or web address] 11/03/2023, 6:14 PM   LOS: 3 days

## 2023-11-03 NOTE — Plan of Care (Signed)

## 2023-11-04 ENCOUNTER — Telehealth: Payer: Self-pay | Admitting: Gastroenterology

## 2023-11-04 DIAGNOSIS — K921 Melena: Secondary | ICD-10-CM | POA: Diagnosis not present

## 2023-11-04 DIAGNOSIS — K922 Gastrointestinal hemorrhage, unspecified: Secondary | ICD-10-CM | POA: Diagnosis not present

## 2023-11-04 DIAGNOSIS — Z7901 Long term (current) use of anticoagulants: Secondary | ICD-10-CM | POA: Diagnosis not present

## 2023-11-04 DIAGNOSIS — D62 Acute posthemorrhagic anemia: Secondary | ICD-10-CM | POA: Diagnosis not present

## 2023-11-04 DIAGNOSIS — N400 Enlarged prostate without lower urinary tract symptoms: Secondary | ICD-10-CM | POA: Diagnosis not present

## 2023-11-04 DIAGNOSIS — N1832 Chronic kidney disease, stage 3b: Secondary | ICD-10-CM | POA: Diagnosis not present

## 2023-11-04 LAB — CBC
HCT: 30.9 % — ABNORMAL LOW (ref 39.0–52.0)
Hemoglobin: 9.7 g/dL — ABNORMAL LOW (ref 13.0–17.0)
MCH: 33.4 pg (ref 26.0–34.0)
MCHC: 31.4 g/dL (ref 30.0–36.0)
MCV: 106.6 fL — ABNORMAL HIGH (ref 80.0–100.0)
Platelets: 224 10*3/uL (ref 150–400)
RBC: 2.9 MIL/uL — ABNORMAL LOW (ref 4.22–5.81)
RDW: 16.2 % — ABNORMAL HIGH (ref 11.5–15.5)
WBC: 4.6 10*3/uL (ref 4.0–10.5)
nRBC: 0 % (ref 0.0–0.2)

## 2023-11-04 LAB — GLUCOSE, CAPILLARY
Glucose-Capillary: 149 mg/dL — ABNORMAL HIGH (ref 70–99)
Glucose-Capillary: 140 mg/dL — ABNORMAL HIGH (ref 70–99)
Glucose-Capillary: 162 mg/dL — ABNORMAL HIGH (ref 70–99)
Glucose-Capillary: 195 mg/dL — ABNORMAL HIGH (ref 70–99)

## 2023-11-04 LAB — SURGICAL PATHOLOGY

## 2023-11-04 MED ORDER — RIVAROXABAN 15 MG PO TABS: 15.0000 mg | ORAL_TABLET | Freq: Every day | ORAL | Status: DC

## 2023-11-04 MED ORDER — FLUTICASONE PROPIONATE 50 MCG/ACT NA SUSP: 1.0000 | Freq: Every day | NASAL | 1 refills | Status: DC

## 2023-11-04 MED ORDER — LOSARTAN POTASSIUM 50 MG PO TABS: 50.0000 mg | ORAL_TABLET | Freq: Every day | ORAL | Status: AC

## 2023-11-04 MED ORDER — PANTOPRAZOLE SODIUM 40 MG PO TBEC: 40.0000 mg | DELAYED_RELEASE_TABLET | Freq: Two times a day (BID) | ORAL | 3 refills | Status: DC

## 2023-11-04 NOTE — Progress Notes (Signed)
IV removed and DC instructions reviewed.  Scripts sent to pharmacy and to call MD for follow up appt.  Son here to give ride home. Transported by WC to main entrance.

## 2023-11-04 NOTE — Discharge Summary (Signed)
Physician Discharge Summary   Patient: Max Davenport MRN: [id number] DOB: 1946-04-28  Admit date:     10/31/2023  Discharge date: 11/04/23  Discharge Physician: Vassie Loll   PCP: Benita Stabile, MD   Recommendations at discharge:  Repeat CBC to follow hemoglobin trend/stability Repeat basic metabolic panel to follow electrolytes and renal function Reassess blood pressure and adjust antihypertensive regimen as required Make sure patient follow-up with gastroenterology service as instructed.  Discharge Diagnoses: Principal Problem:   Acute blood loss anemia Active Problems:   Persistent atrial fibrillation (HCC)   HTN (hypertension)   BPH (benign prostatic hyperplasia)   Uncontrolled type 2 diabetes mellitus with hyperglycemia, without long-term current use of insulin (HCC)   Chronic kidney disease, stage 3b (HCC)   Upper GI bleed   Current use of long term anticoagulation   Malnutrition of moderate degree   Iron deficiency anemia  Brief admission history:  As per H&P written by Dr. Onalee Hua Davenport on 10/31/2023 Max Davenport is a 77 year old male with a history of permanent atrial fibrillation on Xarelto, hypertension, hyperlipidemia, CKD stage III, diabetes mellitus type 2, BPH, basal cell skin cancer, and chronic dizziness presenting with malaise and generalized weakness for the past week.  The patient was recently mated to the hospital from 10/03/2023 to 10/14/2023 at which time the patient was treated for pneumonia and COVID-19.  He was discharged home on antibiotics and steroids and nebulizers.  He states that his breathing has gradually improved since the time of discharge.  However he continues to have malaise and some dizziness.  He has followed up with his PCP.  Routine blood work on 10/26/2023 showed hemoglobin 9.8.  He had a repeat CBC on 10/29/2023 and his hemoglobin dropped to 9.0.  Notably, the patient had a hemoglobin of 14.4 on the day of discharge on 10/14/2023. The patient  states that he has been having melanotic stool on a daily basis.  He did have 1 episode of hematochezia when he was straining to have a bowel movement.  He denies any NSAIDs.  He denies any alcohol use.  Besides antibiotics and nebulizer treatments, and steroids, there is no new medications.  He denies any fevers, chills, chest pain, cough, hemoptysis, headache, neck pain, abdominal pain, hematemesis, hematuria.  He has had some shortness of breath and dyspnea on exertion. In the ED, the patient was afebrile and hemodynamically stable with oxygen saturation 97% room air.  WBC 4.6, hemoglobin 8.6, platelets 214.  Sodium 130, potassium 4.5, bicarb 24, serum creatinine 1.63.  LFTs were unremarkable.  FOBT was positive.  GI was consulted and recommended admission for further evaluation and treatment.  Assessment and Plan: Acute blood loss anemia/melena -Safe to resume anticoagulation as per GI service recommendation. -11/02/23 EGD--Barrett's esophagus -11/02/23 colonoscopy--transv colon 2 polyps removed; desc colon one polyp removed; diverticulosis -11/03/23--capsule endoscopy failing to demonstrate any active bleeding, angiodysplastic lesion or any masses. -Okay to advance diet and continue treatment with PPI twice a day. -GI will follow patient up after discharge. -Avoid the use of NSAIDs -Follow hemoglobin trend/stability -No further active bleeding appreciated at discharge. -Hemoglobin 9.7 at discharge.   CKD stage IIIb -Baseline creatinine 1.5-1.7 -Appears stable and at baseline -Continue to follow renal function trend. -Patient vies to maintain adequate hydration.   Permanent atrial fibrillation -Rate controlled -Continue the use of metoprolol -Safe to resume the use of Xarelto as recommended by GI service.   Uncontrolled diabetes mellitus type 2 with hyperglycemia -10/12/2023 hemoglobin A1c  8.0 -Resume the use of Farxiga and home hypoglycemic regimen -No longer taking  metformin. -Continue holding [name] -Outpatient follow up of his CBGs fluctuation and further adjust hypoglycemic medication by PCP as required.  Essential hypertension -Continue metoprolol succinate and resume adjusted dose of losartan -Reassess blood sugar and adjust antihypertensive regimen as needed.   Mixed hyperlipidemia -Continue statin -Continue Zetia -Heart healthy diet discussed with patient.   BPH -Continue tamsulosin -No complaints of urinary retention.   Moderate Malnutrition -continue as needed supplements -Adequate nutrition discussed with patient. -Patient advised to maintain adequate hydration.  Consultants: Gastroenterology service Procedures performed: See below for x-ray report; endoscopy, colonoscopy and capsule endoscopy (refer to epic results for interpretation/findings). Disposition: Home Diet recommendation: Heart healthy/modified carbohydrate diet.  DISCHARGE MEDICATION: Allergies as of 11/04/2023       Reactions   Ativan [lorazepam] Other (See Comments)   States it makes him "crazy"        Medication List     STOP taking these medications    Mucinex Sinus-Max Clear & Cool 0.05 % nasal spray Generic drug: oxymetazoline   sodium chloride 0.65 % Soln nasal spray Commonly known as: OCEAN       TAKE these medications    acetaminophen 500 MG tablet Commonly known as: TYLENOL Take 500 mg by mouth every 6 (six) hours as needed for mild pain (pain score 1-3).   atorvastatin 20 MG tablet Commonly known as: LIPITOR Take 20 mg by mouth at bedtime.   benzonatate 200 MG capsule Commonly known as: TESSALON Take 1 capsule (200 mg total) by mouth 3 (three) times daily as needed (uncontrolled/refractionary coughing spells).   dextromethorphan-guaiFENesin 30-600 MG 12hr tablet Commonly known as: MUCINEX DM Take 1 tablet by mouth 2 (two) times daily.   ezetimibe 10 MG tablet Commonly known as: ZETIA TAKE 1 TABLET BY MOUTH AT BEDTIME FOR  CHOLESTEROL.   Farxiga 10 MG Tabs tablet Generic drug: dapagliflozin propanediol Take 10 mg by mouth daily.   fluticasone 50 MCG/ACT nasal spray Commonly known as: FLONASE Place 1 spray into both nostrils daily. Start taking on: November 05, 2023   ipratropium-albuterol 0.5-2.5 (3) MG/3ML Soln Commonly known as: DUONEB Take 3 mLs by nebulization every 4 (four) hours as needed (SOB/cough). What changed: when to take this   levocetirizine 5 MG tablet Commonly known as: XYZAL Take 5 mg by mouth at bedtime.   losartan 50 MG tablet Commonly known as: COZAAR Take 1 tablet (50 mg total) by mouth daily. Hold if bp is low Start taking on: November 05, 2023 What changed: when to take this   meclizine 25 MG tablet Commonly known as: ANTIVERT Take 25 mg by mouth 3 (three) times daily as needed for dizziness.   metoprolol succinate 25 MG 24 hr tablet Commonly known as: TOPROL-XL TAKE 1/2 TABLET BY MOUTH DAILY.   multivitamin tablet Take 1 tablet by mouth daily.   pantoprazole 40 MG tablet Commonly known as: Protonix Take 1 tablet (40 mg total) by mouth 2 (two) times daily.   PROBIOTIC ACIDOPHILUS PO Take 1 capsule by mouth daily.   Rivaroxaban 15 MG Tabs tablet Commonly known as: XARELTO Take 1 tablet (15 mg total) by mouth daily with supper. Start taking on: November 08, 2023 What changed: These instructions start on November 08, 2023. If you are unsure what to do until then, ask your doctor or other care provider.   tamsulosin 0.4 MG Caps capsule Commonly known as: FLOMAX Take 0.4 mg by mouth  daily after supper.   traZODone 50 MG tablet Commonly known as: DESYREL Take 50 mg by mouth at bedtime.        Follow-up Information     Benita Stabile, MD. Schedule an appointment as soon as possible for a visit in 10 day(s).   Specialty: Internal Medicine Contact information: [address] [place] [address] [phone]                Discharge  Exam: Filed Weights   10/31/23 0841 11/02/23 1410  Weight: 88 kg 88 kg    General exam: Alert, awake, oriented x 3; afebrile, in no acute distress.  Feeling ready to go home.  No overt bleeding appreciated. Respiratory system: Clear to auscultation. Respiratory effort normal.  Good saturation on room air. Cardiovascular system:RRR. No rubs or gallops; no JVD. Gastrointestinal system: Abdomen is nondistended, soft and nontender. No organomegaly or masses felt. Normal bowel sounds heard. Central nervous system:  No focal neurological deficits. Extremities: No cyanosis or clubbing.  No edema appreciated. Skin: No rashes, no petechiae. Psychiatry: Judgement and insight appear normal. Mood & affect appropriate.   Condition at discharge: Stable and improved.  The results of significant diagnostics from this hospitalization (including imaging, microbiology, ancillary and laboratory) are listed below for reference.   Imaging Studies: US Venous Img Lower Bilateral (DVT)  Result Date: 11/01/2023 CLINICAL DATA:  Bilateral lower extremity edema.  Evaluate for DVT. EXAM: BILATERAL LOWER EXTREMITY VENOUS DOPPLER ULTRASOUND TECHNIQUE: Gray-scale sonography with graded compression, as well as color Doppler and duplex ultrasound were performed to evaluate the lower extremity deep venous systems from the level of the common femoral vein and including the common femoral, femoral, profunda femoral, popliteal and calf veins including the posterior tibial, peroneal and gastrocnemius veins when visible. The superficial great saphenous vein was also interrogated. Spectral Doppler was utilized to evaluate flow at rest and with distal augmentation maneuvers in the common femoral, femoral and popliteal veins. COMPARISON:  None Available. FINDINGS: RIGHT LOWER EXTREMITY Common Femoral Vein: No evidence of thrombus. Normal compressibility, respiratory phasicity and response to augmentation. Saphenofemoral Junction: No  evidence of thrombus. Normal compressibility and flow on color Doppler imaging. Profunda Femoral Vein: No evidence of thrombus. Normal compressibility and flow on color Doppler imaging. Femoral Vein: No evidence of thrombus. Normal compressibility, respiratory phasicity and response to augmentation. Popliteal Vein: No evidence of thrombus. Normal compressibility, respiratory phasicity and response to augmentation. Calf Veins: No evidence of thrombus. Normal compressibility and flow on color Doppler imaging. Superficial Great Saphenous Vein: No evidence of thrombus. Normal compressibility. Other Findings:  None. LEFT LOWER EXTREMITY Common Femoral Vein: No evidence of thrombus. Normal compressibility, respiratory phasicity and response to augmentation. Saphenofemoral Junction: No evidence of thrombus. Normal compressibility and flow on color Doppler imaging. Profunda Femoral Vein: No evidence of thrombus. Normal compressibility and flow on color Doppler imaging. Femoral Vein: No evidence of thrombus. Normal compressibility, respiratory phasicity and response to augmentation. Popliteal Vein: No evidence of thrombus. Normal compressibility, respiratory phasicity and response to augmentation. Calf Veins: No evidence of thrombus. Normal compressibility and flow on color Doppler imaging. Superficial Great Saphenous Vein: No evidence of thrombus. Normal compressibility. Other Findings: There is a minimal amount of subcutaneous edema at the level of the left calf. IMPRESSION: No evidence of DVT within either lower extremity. Electronically Signed   By: Simonne Come M.D.   On: 11/01/2023 16:58   CT HEAD W & WO CONTRAST ( )  Result Date: 10/29/2023  CLINICAL DATA:  Confusion with dizziness, history of basal cell EXAM: CT HEAD WITHOUT AND WITH CONTRAST TECHNIQUE: Contiguous axial images were obtained from the base of the skull through the vertex without and with intravenous contrast. RADIATION DOSE REDUCTION: This exam  was performed according to the departmental dose-optimization program which includes automated exposure control, adjustment of the mA and/or kV according to patient size and/or use of iterative reconstruction technique. CONTRAST:  75mL ISOVUE-300 IOPAMIDOL (ISOVUE-300) INJECTION 61% COMPARISON:  None Available. FINDINGS: Brain: No abnormal parenchymal or meningeal enhancement. No evidence of acute infarction, hemorrhage, mass, mass effect, or midline shift. No hydrocephalus or extra-axial fluid collection. Periventricular white matter changes, likely the sequela of chronic small vessel ischemic disease. Vascular: No hyperdense vessel on precontrast images. Grossly normal arterial and venous enhancement Skull: Negative for fracture or focal lesion. Sinuses/Orbits: Minimal mucosal thickening in the paranasal sinuses. No acute finding in the orbits. Other: The mastoid air cells are well aerated. IMPRESSION: No acute intracranial process. Electronically Signed   By: Wiliam Ke M.D.   On: 10/29/2023 11:13   CT Chest W Contrast  Result Date: 10/12/2023 CLINICAL DATA:  Cough, chronic, persisting greater than 8 weeks, failed empiric treatment EXAM: CT CHEST WITH CONTRAST TECHNIQUE: Multidetector CT imaging of the chest was performed during intravenous contrast administration. RADIATION DOSE REDUCTION: This exam was performed according to the departmental dose-optimization program which includes automated exposure control, adjustment of the mA and/or kV according to patient size and/or use of iterative reconstruction technique. CONTRAST:  75mL OMNIPAQUE IOHEXOL 300 MG/ML  SOLN COMPARISON:  Radiographs earlier today FINDINGS: Cardiovascular: Normal heart size. No pericardial effusion. Coronary artery and aortic atherosclerotic calcification. Mediastinum/Nodes: Trachea and esophagus are unremarkable. Borderline mediastinal and left hilar adenopathy. For example 1.1 cm pretracheal lymph node on 2/54 and 1.1 cm left  hilar lymph node on 2/71. Lungs/Pleura: Bilateral peripheral and peribronchovascular predominant consolidation and ground-glass opacities greatest in the left lower lobe. Small cyst with cavitation in the posterior left lower lobe (3/88). No pleural effusion or pneumothorax. Upper Abdomen: No acute abnormality. Musculoskeletal: No acute fracture. IMPRESSION: Multifocal airspace opacities. This may represent multifocal pneumonia. In the setting of chronic symptoms, differential considerations include cryptogenic organizing pneumonia and chronic eosinophilic pneumonia. Aortic Atherosclerosis (ICD10-I70.0). Electronically Signed   By: Minerva Fester M.D.   On: 10/12/2023 03:54   DG Chest 2 View  Result Date: 10/12/2023 CLINICAL DATA:  Cough, weight loss EXAM: CHEST - 2 VIEW COMPARISON:  08/26/2006 FINDINGS: The basilar airspace opacities compatible with pneumonia. Suspected small left pleural effusion. No pneumothorax. Normal cardiomediastinal silhouette. IMPRESSION: Left lower lobe pneumonia. Follow-up in 6-8 weeks after treatment is recommended to ensure resolution. Electronically Signed   By: Minerva Fester M.D.   On: 10/12/2023 00:19    Microbiology: Results for orders placed or performed during the hospital encounter of 10/11/23  SARS Coronavirus 2 by RT PCR (hospital order, performed in [facility] hospital lab) *cepheid single result test* Anterior Nasal Swab     Status: Abnormal   Collection Time: 10/12/23  7:11 AM   Specimen: Anterior Nasal Swab  Result Value Ref Range Status   SARS Coronavirus 2 by RT PCR POSITIVE (A) NEGATIVE Final    Comment: (NOTE) SARS-CoV-2 target nucleic acids are DETECTED  SARS-CoV-2 RNA is generally detectable in upper respiratory specimens  during the acute phase of infection.  Positive results are indicative  of the presence of the identified virus, but do not rule out bacterial infection or co-infection with  other pathogens not detected by the test.  Clinical  correlation with patient history and  other diagnostic information is necessary to determine patient infection status.  The expected result is negative.  Fact Sheet for Patients:   RoadLapTop.co.za   Fact Sheet for Healthcare Providers:   http://kim-miller.com/    This test is not yet approved or cleared by the [place] FDA and  has been authorized for detection and/or diagnosis of SARS-CoV-2 by FDA under an Emergency Use Authorization (EUA).  This EUA will remain in effect (meaning this test can be used) for the duration of  the COVID-19 declaration under Section 564(b)(1)  of the Act, 21 U.S.C. section 360-bbb-3(b)(1), unless the authorization is terminated or revoked sooner.   Performed at [facility], [address], [place], [place] [address]   Respiratory (~20 pathogens) panel by PCR     Status: None   Collection Time: 10/12/23  8:18 AM   Specimen: Nasopharyngeal Swab; Respiratory  Result Value Ref Range Status   Adenovirus NOT DETECTED NOT DETECTED Final   Coronavirus 229E NOT DETECTED NOT DETECTED Final    Comment: (NOTE) The Coronavirus on the Respiratory Panel, DOES NOT test for the novel  Coronavirus (2019 nCoV)    Coronavirus HKU1 NOT DETECTED NOT DETECTED Final   Coronavirus NL63 NOT DETECTED NOT DETECTED Final   Coronavirus OC43 NOT DETECTED NOT DETECTED Final   Metapneumovirus NOT DETECTED NOT DETECTED Final   Rhinovirus / Enterovirus NOT DETECTED NOT DETECTED Final   Influenza A NOT DETECTED NOT DETECTED Final   Influenza B NOT DETECTED NOT DETECTED Final   Parainfluenza Virus 1 NOT DETECTED NOT DETECTED Final   Parainfluenza Virus 2 NOT DETECTED NOT DETECTED Final   Parainfluenza Virus 3 NOT DETECTED NOT DETECTED Final   Parainfluenza Virus 4 NOT DETECTED NOT DETECTED Final   Respiratory Syncytial Virus NOT DETECTED NOT DETECTED Final   Bordetella pertussis NOT DETECTED NOT DETECTED Final   Bordetella  Parapertussis NOT DETECTED NOT DETECTED Final   Chlamydophila pneumoniae NOT DETECTED NOT DETECTED Final   Mycoplasma pneumoniae NOT DETECTED NOT DETECTED Final    Comment: Performed at [facility], [address] [place], [place] [address]    Labs: CBC: Recent Labs  Lab 10/31/23 [phone] 11/01/23 0437 11/02/23 0412 11/03/23 0420 11/04/23 0819  WBC 5.6 6.7 4.0 4.9 4.6  NEUTROABS 4.5  --   --   --   --   HGB 8.6* 7.8* 7.8* 8.2* 9.7*  HCT 26.3* 23.8* 24.0* 24.8* 30.9*  MCV 104.4* 104.8* 103.9* 104.2* 106.6*  PLT 214 184 177 193 224   Basic Metabolic Panel: Recent Labs  Lab 10/31/23 0951 11/01/23 0437 11/03/23 0420  NA 138 137 137  K 4.5 4.3 4.2  CL 105 104 103  CO2 24 27 25   GLUCOSE 220* 140* 155*  BUN 22 15 8   CREATININE 1.63* 1.54* 1.54*  CALCIUM 8.2* 8.6* 8.5*   Liver Function Tests: Recent Labs  Lab 10/31/23 0951  AST 25  ALT 26  ALKPHOS 60  BILITOT 0.4  PROT 5.9*  ALBUMIN 3.0*   CBG: Recent Labs  Lab 11/03/23 1111 11/03/23 1618 11/03/23 2119 11/04/23 0750 11/04/23 1109  GLUCAP 125* 149* 101* 140* 162*    Discharge time spent: greater than 30 minutes.  Signed: Vassie Loll, MD [facility] 11/04/2023

## 2023-11-04 NOTE — Anesthesia Postprocedure Evaluation (Signed)
Anesthesia Post Note  Patient: Ustin Tennenbaum Puffenbarger  Procedure(s) Performed: COLONOSCOPY WITH PROPOFOL ESOPHAGOGASTRODUODENOSCOPY (EGD) WITH PROPOFOL BIOPSY  Patient location during evaluation: Phase II Anesthesia Type: General Level of consciousness: awake Pain management: pain level controlled Vital Signs Assessment: post-procedure vital signs reviewed and stable Respiratory status: spontaneous breathing and respiratory function stable Cardiovascular status: blood pressure returned to baseline and stable Postop Assessment: no headache and no apparent nausea or vomiting Anesthetic complications: no Comments: Late entry   No notable events documented.   Last Vitals:  Vitals:   11/04/23 0542 11/04/23 0735  BP: 114/76   Pulse: 93   Resp: 18   Temp: 36.8 C   SpO2: 97% 97%    Last Pain:  Vitals:   11/04/23 0915  TempSrc:   PainSc: 0-No pain                 Windell Norfolk

## 2023-11-04 NOTE — Progress Notes (Signed)
Mobility Specialist Progress Note:    11/04/23 1100  Mobility  Activity Ambulated with assistance in hallway  Level of Assistance Independent  Assistive Device None  Distance Ambulated (ft) 500 ft  Range of Motion/Exercises Active;All extremities  Activity Response Tolerated well  Mobility Referral Yes  $Mobility charge 1 Mobility  Mobility Specialist Start Time (ACUTE ONLY) 1030  Mobility Specialist Stop Time (ACUTE ONLY) 1045  Mobility Specialist Time Calculation (min) (ACUTE ONLY) 15 min   Pt received in bed, family in room. Eager for mobility, independently able to stand and ambulate with no AD. Tolerated well, asx throughout. Returned to room, all needs met.   Lawerance Bach Mobility Specialist Please contact via Special educational needs teacher or  Rehab office at [phone]

## 2023-11-04 NOTE — Plan of Care (Signed)

## 2023-11-04 NOTE — Telephone Encounter (Signed)
Patient needs hospital follow up in 2-3 weeks with Dr. Marletta Lor or me for gi bleeding.   Please arrange for CBC in one week. Patient likely to be discharged from hospital today.

## 2023-11-04 NOTE — Op Note (Addendum)
  Small Bowel Givens Capsule Study Procedure date:  11/03/23  Referring Provider:  Roetta Sessions, MD  PCP:  [name], Kathleene Hazel, MD  Indication for procedure:  Acute blood loss anemia and melena in the setting of Xarelto for Afib. Reports intermittent black stool, paper hematochezia after straining. Stool heme positive this admission. Patient also notes frequent nose bleeds lasting for up to 15 minutes at a time, one day last week he had three episodes the same day. This admission, EGD and colonoscopy with Barrett's esophagus without dysplasia, three colon adenomatous polyps removed, diverticulosis, normal terminal ileum. No findings to explain degree of blood loss, therefore capsule study being performed to further evaluate the small bowel.   Patient data:  Wt: 88 kg   Findings:  Small bowel capsule complete with passage of capsule into colon by the end of the study. Normal appearing small bowel with no overt bleeding. No masses, ulcerations, or angiodysplastic lesions seen.   First Gastric image: 2 minutes 10 seconds First Duodenal image: 12 minutes 18 seconds First Ileo-Cecal Valve image: 5 hours 7 minutes 3 seconds First Cecal image: 5 hours 8 minutes 41 seconds Gastric Passage time: 10 minutes  Small Bowel Passage time: 4 hours 56 minutes   Summary & Recommendations: No active bleeding noted on small bowel capsule study. Small bowel appeared normal. Etiology for acute blood loss anemia may be multifactorial in setting of Xarelto, frequent nosebleeds and possible unidentified GI source.   Would recommend continuing pantoprazole 40mg  twice daily for now. Would transition to once daily in 12 weeks. He will need to stay on PPI chronically with Barrett's esophagus.   He will need close monitoring of H/H as outpatient, initially in one week. Monitor for overt bleeding.   Weigh risks vs benefits for ongoing anticoagulation but no contraindication for continuing at this time if clinically  indicated. If patient displays ongoing/recurrent bleeding or persistent significant anemia, use of anticoagulation will need to be reconsidered.   Leanna Battles. Dixon Boos [facility] [phone] 11/21/202412:53 PM

## 2023-11-05 ENCOUNTER — Encounter (HOSPITAL_COMMUNITY): Payer: Self-pay | Admitting: Gastroenterology

## 2023-11-05 ENCOUNTER — Other Ambulatory Visit: Payer: Self-pay

## 2023-11-05 DIAGNOSIS — K922 Gastrointestinal hemorrhage, unspecified: Secondary | ICD-10-CM

## 2023-11-05 NOTE — Telephone Encounter (Signed)
Pt was made aware, appt was made and lab was ordered

## 2023-11-05 NOTE — Telephone Encounter (Signed)
Lmom for return call.

## 2023-11-07 ENCOUNTER — Other Ambulatory Visit: Payer: Self-pay | Admitting: Cardiology

## 2023-11-08 NOTE — Telephone Encounter (Signed)
Prescription refill request for Xarelto received.  Indication: AF Last office visit: 09/09/23  Max Bible MD Weight: 96.1kg Age: 77 Scr: 1.54 on 11/03/23  Epic CrCl: 54.60  Continue Xarelto 15mg  daily per MD.  Refill approved.

## 2023-11-10 ENCOUNTER — Ambulatory Visit: Payer: HMO | Attending: Nurse Practitioner | Admitting: Nurse Practitioner

## 2023-11-10 ENCOUNTER — Encounter: Payer: Self-pay | Admitting: Nurse Practitioner

## 2023-11-10 VITALS — BP 112/62 | HR 72 | Ht 69.0 in | Wt 199.6 lb

## 2023-11-10 DIAGNOSIS — I10 Essential (primary) hypertension: Secondary | ICD-10-CM | POA: Diagnosis not present

## 2023-11-10 DIAGNOSIS — E782 Mixed hyperlipidemia: Secondary | ICD-10-CM

## 2023-11-10 DIAGNOSIS — R42 Dizziness and giddiness: Secondary | ICD-10-CM | POA: Diagnosis not present

## 2023-11-10 DIAGNOSIS — K922 Gastrointestinal hemorrhage, unspecified: Secondary | ICD-10-CM | POA: Diagnosis not present

## 2023-11-10 DIAGNOSIS — N1832 Chronic kidney disease, stage 3b: Secondary | ICD-10-CM

## 2023-11-10 DIAGNOSIS — I4821 Permanent atrial fibrillation: Secondary | ICD-10-CM | POA: Diagnosis not present

## 2023-11-10 LAB — CBC WITH DIFFERENTIAL/PLATELET
Absolute Lymphocytes: 1168 {cells}/uL (ref 850–3900)
Absolute Monocytes: 442 {cells}/uL (ref 200–950)
Basophils Absolute: 18 {cells}/uL (ref 0–200)
Basophils Relative: 0.4 %
Eosinophils Absolute: 120 {cells}/uL (ref 15–500)
Eosinophils Relative: 2.6 %
HCT: 30.1 % — ABNORMAL LOW (ref 38.5–50.0)
Hemoglobin: 9.7 g/dL — ABNORMAL LOW (ref 13.2–17.1)
MCH: 32.1 pg (ref 27.0–33.0)
MCHC: 32.2 g/dL (ref 32.0–36.0)
MCV: 99.7 fL (ref 80.0–100.0)
MPV: 9.9 fL (ref 7.5–12.5)
Monocytes Relative: 9.6 %
Neutro Abs: 2852 {cells}/uL (ref 1500–7800)
Neutrophils Relative %: 62 %
Platelets: 288 10*3/uL (ref 140–400)
RBC: 3.02 10*6/uL — ABNORMAL LOW (ref 4.20–5.80)
RDW: 14 % (ref 11.0–15.0)
Total Lymphocyte: 25.4 %
WBC: 4.6 10*3/uL (ref 3.8–10.8)

## 2023-11-10 NOTE — Progress Notes (Signed)
Cardiology Office Note:  .   Date: 11/10/2023 ID:  WEIR CROFF, DOB Feb 07, 1946, MRN [id number] PCP: Benita Stabile, MD  [facility] Providers Cardiologist:  Nona Dell, MD    History of Present Illness: .   Max Davenport is a 77 y.o. male with a PMH of permanent A-fib, chronic recurring dizziness/unsteadiness, exertional fatigue, hypertension, mixed hyperlipidemia, type 2 diabetes, CKD stage IIIb, history of upper GI bleed, who presents today for hospital follow-up.   Last seen by Dr. Diona Browner on September 09, 2023.  Patient stated he underwent PT evaluation for recurring unsteadiness/dizziness and was evaluated for positional vertigo, did not report any significant improvement in his symptoms.  Patient mended to increase fatigue with typical exertion, denied any chest pain.  There was not a strong indication that symptoms are cardiac, was recommended to follow-up with PCP regarding his symptoms and to have possible brain imaging performed.  Was instructed to do a temporary hold of Flomax and then [name] as separate times to see if this would improve his symptoms.   Hospital admission in October 24 for multifocal pneumonia, tested positive for COVID.  Readmitted in November 2024 for malaise and dizziness.  Hemoglobin was found to be dropping, patient did have 1 noted episode of hematochezia when straining to have a bowel movement.  FOBT was positive.  GI was consulted.  Underwent EGD on November 02, 2023 that revealed Barrett's esophagus.  Also underwent colonoscopy that day, 2 polyps were removed along transverse colon, descending colon had 1 polyp removed, noted to have diverticulosis.  The following day, underwent capsule endoscopy with no evidence of active bleeding, masses, or lesions.  Today he presents for hospital follow-up.  He states he is feeling better and doing better since leaving the hospital.  Still has some nasal congestion/phlegm, but says he is breathing better.  His  fatigue is improving per his report.  Denies any recurrence in bleeding, denies any chest pain. Denies any palpitations, syncope, presyncope, dizziness, orthopnea, PND, swelling or significant weight changes, acute bleeding, or claudication.  ROS: Negative. See HPI.   Studies Reviewed: Marland Kitchen    Lexiscan 09/2023:   The study is normal. Findings are consistent with no ischemia. The study is low risk.   No ST deviation was noted. The ECG was negative for ischemia.   LV perfusion is normal.  No significant myocardial perfusion defects to indicate scar or ischemia.   Left ventricular function is normal. Nuclear stress EF: 61%.   Low risk study with no significant ischemia and LVEF 61%.  Echo 10/2020:  1. Left ventricular ejection fraction, by estimation, is 65 to 70%. The  left ventricle has normal function. The left ventricle has no regional  wall motion abnormalities. Left ventricular diastolic parameters are  indeterminate.   2. Right ventricular systolic function is normal. The right ventricular  size is normal. There is normal pulmonary artery systolic pressure.   3. Left atrial size was moderately dilated.   4. Right atrial size was moderately dilated.   5. The mitral valve is abnormal. Trivial mitral valve regurgitation.   6. The aortic valve is abnormal. Aortic valve regurgitation is mild. Mild  aortic valve sclerosis is present, with no evidence of aortic valve  stenosis.   7. The inferior vena cava is normal in size with greater than 50%  respiratory variability, suggesting right atrial pressure of 3 mmHg.  Physical Exam:   VS:  BP 112/62   Pulse 72   Ht  5\' 9"  (1.753 m)   Wt 199 lb 9.6 oz (90.5 kg)   SpO2 100%   BMI 29.48 kg/m    Wt Readings from Last 3 Encounters:  11/10/23 199 lb 9.6 oz (90.5 kg)  11/02/23 194 lb 0.1 oz (88 kg)  10/12/23 194 lb 10.7 oz (88.3 kg)    GEN: Well nourished, well developed in no acute distress NECK: No JVD; No carotid bruits CARDIAC: S1/S2,  irregularly irregular rhythm, no murmurs, rubs, gallops RESPIRATORY:  Clear to auscultation without rales, wheezing or rhonchi  ABDOMEN: Soft, non-tender, non-distended EXTREMITIES:  No edema; No deformity   ASSESSMENT AND PLAN: .    Permanent A-fib Denies any tachycardia or palpitations.  Heart rate is well-controlled today.  Continue metoprolol succinate and Xarelto.  He is on appropriate dosage of Xarelto and denies any recent bleeding issues.   HTN Blood pressure is stable. Discussed to monitor BP at home at least 2 hours after medications and sitting for 5-10 minutes.  No medication changes at this time. Heart healthy diet and regular cardiovascular exercise encouraged.   Mixed HLD LDL earlier this year was 616.  Continue atorvastatin and Zetia.  Appears his atorvastatin is being managed by PCP.  Recommend increasing atorvastatin if tolerated to 40 mg daily and repeating FLP/LFT in 6 to 8 weeks per protocol. Heart healthy diet and regular cardiovascular exercise encouraged. Continue to follow with PCP.  CKD stage 3b Most recent labs are stable.  Avoid nephrotoxic agents.  No medication changes at this time.  Continue follow-up with PCP.  Chronic dizziness This remains stable, and says he feels better since hospital discharge.  No medication changes at this time.  Continue follow-up with PCP for further evaluation.      Dispo: Follow-up with Dr. Diona Browner or APP in 6 months or sooner if anything changes.  Signed, Sharlene Dory, NP

## 2023-11-10 NOTE — Patient Instructions (Addendum)

## 2023-11-15 ENCOUNTER — Other Ambulatory Visit: Payer: Self-pay

## 2023-11-15 ENCOUNTER — Telehealth: Payer: Self-pay | Admitting: *Deleted

## 2023-11-15 DIAGNOSIS — E1110 Type 2 diabetes mellitus with ketoacidosis without coma: Secondary | ICD-10-CM

## 2023-11-15 NOTE — Consult Note (Signed)
[facility] Liaison Note  11/15/2023  Max Davenport 1946/05/25 [id number]  Location: RN Hospital Liaison screened the patient remotely at [facility].  Insurance: [employer]   Max Davenport is a 77 y.o. male who is a Primary Care Patient of Margo Aye, Kathleene Hazel, MD. The patient was screened for 30 day readmission hospitalization with noted medium risk score for unplanned readmission risk with 2 IP/1 ED in 6 months.  The patient was assessed for potential Care Management service needs for post hospital transition for care coordination. Review of patient's electronic medical record reveals patient was admitted for GI Bleed. Pt discharged home with self care. Due to his history of uncontrolled diabetes will reach out to pt's for VBCI services. Spoke with pt and introduced VBCI care management services and offered a care coordinator for a post hospital prevention readmission outreach call to assist pt in managing his diabetes (pt receptive). Will make a referral for services.  VBCI Care Management/Population Health does not replace or interfere with any arrangements made by the Inpatient Transition of Care team.   For questions contact:   Elliot Cousin, RN, [facility] Liaison [place]   [facility], Population Health Office Hours MTWF  8:00 am-6:00 pm Direct Dial: [phone] mobile [phone] [Office toll free line] Office Hours are M-F 8:30 - 5 pm [email or web address]

## 2023-11-15 NOTE — Progress Notes (Signed)
  Care Coordination   Note   11/15/2023 Name: Max Davenport MRN: [id number] DOB: 08/23/1946  Max Davenport is a 77 y.o. year old male who sees Margo Aye, Kathleene Hazel, MD for primary care. I reached out to Max Davenport by phone today to offer care coordination services.  [name] was given information about Care Coordination services today including:   The Care Coordination services include support from the care team which includes your Nurse Coordinator, Clinical Social Worker, or Pharmacist.  The Care Coordination team is here to help remove barriers to the health concerns and goals most important to you. Care Coordination services are voluntary, and the patient may decline or stop services at any time by request to their care team member.   Care Coordination Consent Status: Patient agreed to services and verbal consent obtained.   Follow up plan:  Telephone appointment with care coordination team member scheduled for:  11/16/23  Encounter Outcome:  Patient Scheduled  [facility] Coordination Care Guide  Direct Dial: [phone]

## 2023-11-16 ENCOUNTER — Ambulatory Visit: Payer: Self-pay | Admitting: *Deleted

## 2023-11-16 NOTE — Patient Outreach (Signed)
Care Coordination   Initial Visit Note   11/16/2023 Name: Max Davenport MRN: [id number] DOB: 1946-10-18  Max Davenport is a 77 y.o. year old male who sees Max Davenport, Max Hazel, MD for primary care. I spoke with  Max Davenport by phone today.  What matters to the patients health and wellness today?  Recent pneumonia & covid positive with dark stools & low hemoglobin values  Last hospital admission 10/31/23- 11/04/23 at discharge his Hgb was 9.7  + on 09/16/23 was 14.4   Voiced understanding of foods that helps with increasing hemoglobin   All major conditions borderline per patient He reports his recent worsening symptoms started as allergies. His daughter in law had him see a Max Davenport to confirm he had a bad sinus infection. This lead to his admission for Pneumonia and covid. Then internal bleeding and a low hemoglobin was noted during admission. An endoscopy & colonoscopy identified polyps & GERD (gastroesophageal reflux disease)  He is recuperating but still has a cough with some fatigue. His stools are no longer dark & he has productive white secretions (no longer blood tinged nasal secretions)  Diabetes Cbg 154 this am was 154  Values varies low 123, 150' s last 3 days Voiced understanding of Carbohydrate planning/intake education   Support system His sister was  nurse and his daughter in law is an active nurse   Hypotension episodes until his MD decreased his losartan in half Had some dizziness but resolved with decrease of losartan average systolic BP at 140-150   Chronic Kidney disease (CKD) Urine previously gold but is now clear No further with start & stopping urination streams  Provider visits He reports he is trying to give more info to MD related to his symptoms to help with assessments. To be seen at his pcp office on 11/17/23 by Max Davenport for his hospital follow up    Goals Addressed             This Visit's Progress    Patient will have improved  hemaglobin and respiratory symptoms RN CM care coordinator       Interventions Today    Flowsheet Row Most Recent Value  Chronic Disease   Chronic disease during today's visit Diabetes, Atrial Fibrillation (AFib), Chronic Kidney Disease/End Stage Renal Disease (ESRD), Other  [recent covid positive, pneumonia, occult blood, hypotension]  General Interventions   General Interventions Discussed/Reviewed General Interventions Discussed  Doctor Visits Discussed/Reviewed Doctor Visits Discussed, PCP  Durable Medical Equipment (DME) Glucomoter  PCP/Specialist Visits Compliance with follow-up visit  Exercise Interventions   Exercise Discussed/Reviewed Exercise Discussed, Physical Activity  [remains weak at intervals]  Physical Activity Discussed/Reviewed Physical Activity Reviewed  Education Interventions   Education Provided --  [carbohydrate intake, food labels, portion sizes, urine & sputum color changes,, hypotension, cbg surges with illnesses]  Provided Verbal Education On Nutrition, Labs, Blood Sugar Monitoring, Medication, When to see the doctor, Insurance Plans  Labs Reviewed --  [CBC's hemaglobin value]  Nutrition Interventions   Nutrition Discussed/Reviewed Nutrition Discussed, Carbohydrate meal planning, Fluid intake, Portion sizes, Decreasing sugar intake, Increasing proteins  Pharmacy Interventions   Pharmacy Dicussed/Reviewed Pharmacy Topics Discussed, Affording Medications, Medications and their functions              SDOH assessments and interventions completed:  Yes  SDOH Interventions Today    Flowsheet Row Most Recent Value  SDOH Interventions   Food Insecurity Interventions Intervention Not Indicated  Housing Interventions Intervention Not Indicated  Utilities  Interventions Intervention Not Indicated  Stress Interventions Intervention Not Indicated  Health Literacy Interventions Intervention Not Indicated        Care Coordination Interventions:  Yes, provided    Follow up plan: Follow up call scheduled for 11/30/23    Encounter Outcome:  Patient Visit Completed   Cala Bradford L. Noelle Penner, RN, BSN, [facility]  VBCI Care Management Coordinator  [phone]  Fax: [phone]

## 2023-11-16 NOTE — Patient Instructions (Addendum)
Visit Information  Thank you for taking time to visit with me today. Please don't hesitate to contact me if I can be of assistance to you.   Make changes to your diet. You may need more iron and protein. This can help your body make more red blood cells.  Following are the goals we discussed today:   Goals Addressed             This Visit's Progress    Patient will have improved hemaglobin and respiratory symptoms RN CM care coordinator       Interventions Today    Flowsheet Row Most Recent Value  Chronic Disease   Chronic disease during today's visit Diabetes, Atrial Fibrillation (AFib), Chronic Kidney Disease/End Stage Renal Disease (ESRD), Other  [recent covid positive, pneumonia, occult blood, hypotension]  General Interventions   General Interventions Discussed/Reviewed General Interventions Discussed  Doctor Visits Discussed/Reviewed Doctor Visits Discussed, PCP  Durable Medical Equipment (DME) Glucomoter  PCP/Specialist Visits Compliance with follow-up visit  Exercise Interventions   Exercise Discussed/Reviewed Exercise Discussed, Physical Activity  [remains weak at intervals]  Physical Activity Discussed/Reviewed Physical Activity Reviewed  Education Interventions   Education Provided --  [carbohydrate intake, food labels, portion sizes, urine & sputum color changes,, hypotension, cbg surges with illnesses]  Provided Verbal Education On Nutrition, Labs, Blood Sugar Monitoring, Medication, When to see the doctor, Insurance Plans  Labs Reviewed --  [CBC's hemaglobin value]  Nutrition Interventions   Nutrition Discussed/Reviewed Nutrition Discussed, Carbohydrate meal planning, Fluid intake, Portion sizes, Decreasing sugar intake, Increasing proteins  Pharmacy Interventions   Pharmacy Dicussed/Reviewed Pharmacy Topics Discussed, Affording Medications, Medications and their functions              Our next appointment is by telephone on 11/30/23 at 1000  Please call  the care guide team at [phone] if you need to cancel or reschedule your appointment.   If you are experiencing a Mental Health or Behavioral Health Crisis or need someone to talk to, please call the Suicide and Crisis Lifeline: 988 call the [place] National Suicide Prevention Lifeline: 580-460-2073 or TTY: (929)692-7475 TTY 763 176 9148) to talk to a trained counselor call 1-800-273-TALK (toll free, 24 hour hotline) call the [facility]: 331-420-3393 call 911   Patient verbalizes understanding of instructions and care plan provided today and agrees to view in MyChart. Active MyChart status and patient understanding of how to access instructions and care plan via MyChart confirmed with patient.     The patient has been provided with contact information for the care management team and has been advised to call with any health related questions or concerns.   Antionio Negron L. Noelle Penner, RN, BSN, [facility]  VBCI Care Management Coordinator  [phone]  Fax: [phone]

## 2023-11-17 DIAGNOSIS — D751 Secondary polycythemia: Secondary | ICD-10-CM | POA: Diagnosis not present

## 2023-11-17 DIAGNOSIS — J189 Pneumonia, unspecified organism: Secondary | ICD-10-CM | POA: Diagnosis not present

## 2023-11-17 DIAGNOSIS — R42 Dizziness and giddiness: Secondary | ICD-10-CM | POA: Diagnosis not present

## 2023-11-17 DIAGNOSIS — N189 Chronic kidney disease, unspecified: Secondary | ICD-10-CM | POA: Diagnosis not present

## 2023-11-17 DIAGNOSIS — Z7689 Persons encountering health services in other specified circumstances: Secondary | ICD-10-CM | POA: Diagnosis not present

## 2023-11-17 DIAGNOSIS — E1169 Type 2 diabetes mellitus with other specified complication: Secondary | ICD-10-CM | POA: Diagnosis not present

## 2023-11-17 DIAGNOSIS — R809 Proteinuria, unspecified: Secondary | ICD-10-CM | POA: Diagnosis not present

## 2023-11-17 DIAGNOSIS — Z8719 Personal history of other diseases of the digestive system: Secondary | ICD-10-CM | POA: Diagnosis not present

## 2023-11-17 DIAGNOSIS — I129 Hypertensive chronic kidney disease with stage 1 through stage 4 chronic kidney disease, or unspecified chronic kidney disease: Secondary | ICD-10-CM | POA: Diagnosis not present

## 2023-11-17 DIAGNOSIS — R5383 Other fatigue: Secondary | ICD-10-CM | POA: Diagnosis not present

## 2023-11-17 DIAGNOSIS — E1122 Type 2 diabetes mellitus with diabetic chronic kidney disease: Secondary | ICD-10-CM | POA: Diagnosis not present

## 2023-11-17 DIAGNOSIS — I4891 Unspecified atrial fibrillation: Secondary | ICD-10-CM | POA: Diagnosis not present

## 2023-11-19 ENCOUNTER — Ambulatory Visit (INDEPENDENT_AMBULATORY_CARE_PROVIDER_SITE_OTHER): Payer: HMO | Admitting: Gastroenterology

## 2023-11-19 ENCOUNTER — Encounter: Payer: Self-pay | Admitting: Gastroenterology

## 2023-11-19 VITALS — BP 131/77 | HR 96 | Temp 97.5°F | Ht 69.0 in | Wt 202.2 lb

## 2023-11-19 DIAGNOSIS — K922 Gastrointestinal hemorrhage, unspecified: Secondary | ICD-10-CM | POA: Diagnosis not present

## 2023-11-19 DIAGNOSIS — R040 Epistaxis: Secondary | ICD-10-CM

## 2023-11-19 DIAGNOSIS — Z860101 Personal history of adenomatous and serrated colon polyps: Secondary | ICD-10-CM | POA: Diagnosis not present

## 2023-11-19 DIAGNOSIS — D62 Acute posthemorrhagic anemia: Secondary | ICD-10-CM

## 2023-11-19 DIAGNOSIS — K2270 Barrett's esophagus without dysplasia: Secondary | ICD-10-CM | POA: Insufficient documentation

## 2023-11-19 MED ORDER — PANTOPRAZOLE SODIUM 40 MG PO TBEC: 40.0000 mg | DELAYED_RELEASE_TABLET | Freq: Every day | ORAL | 3 refills | Status: DC

## 2023-11-19 NOTE — Patient Instructions (Addendum)
Update labs at [facility] in 2-3 weeks or sooner if you are feeling more tired or more short of breath.  Call if you have any change in symptoms or see more black or bloody stools. Start ferrous sulfate 325mg  every other day (oral iron). Please note, your stools may turn very dark on iron.  You have a condition called Barrett's esophagus which puts in at slighter risk than the average person for developing esophageal cancer. Because of this, we will consider updated endoscopy in five years. You had three polyps removed from your colon, which had potential of developing into cancer. Because of this, we will consider updated colonoscopy in five years.  You will continue pantoprazole (Protonix) twice daily for 3 months, then continue once daily before breakfast. I will go ahead and send RX for your pharmacy to keep on file.

## 2023-11-19 NOTE — Progress Notes (Signed)
GI Office Note    Referring Provider: Benita Stabile, MD Primary Care Physician:  Benita Stabile, MD  Primary Gastroenterologist: Hennie Duos. Marletta Lor, DO   Chief Complaint   Chief Complaint  Patient presents with   Follow-up    Pt here for hospital followup    History of Present Illness   Max Davenport is a 77 y.o. male presenting today for hospital follow-up.  He was hospitalized back in November for GI bleeding.  Patient was having black stools and intermittent bright red blood per rectum.  Chronically on Xarelto.  On presentation his hemoglobin was 8.6 down from 14.4 one month prior.  At time of discharge (November 21) his hemoglobin was 9.7.  Stable on repeat 1 week later.  Completed extensive evaluation while inpatient including colonoscopy, EGD, small bowel capsule study.  No lesions with active bleeding noted.  Of potential clinical significance, patient had mention frequent nosebleeds.  Today: Patient feeling a lot better but not back at baseline.  Energy has improved.  Appetite much better.  Bowel movements normal.  Stool normal color now.  No abdominal pain.  No reflux, vomiting, dysphagia.  EGD November 2024: -Esophageal mucosal changes classified as Barrett's stage CO-M2 per Prague criteria -2 cm hiatal hernia -Esophageal biopsies consistent with Barrett's, negative for dysplasia -Repeat EGD in 5 years  Colonoscopy November 2024: -Normal examined portion of ileum -two 3 to 4 mm polyps in the transverse colon, tubular adenoma -3 mm polyp in the descending colon, tubular adenoma -Diverticulosis -Repeat EGD in 5 years  Capsule endoscopy completed November 04, 2023: -Normal-appearing small bowel with no overt bleeding   Medications   Current Outpatient Medications  Medication Sig Dispense Refill   acetaminophen (TYLENOL) 500 MG tablet Take 500 mg by mouth every 6 (six) hours as needed for mild pain (pain score 1-3).     atorvastatin (LIPITOR) 20 MG tablet Take  20 mg by mouth at bedtime.     benzonatate (TESSALON) 200 MG capsule Take 1 capsule (200 mg total) by mouth 3 (three) times daily as needed (uncontrolled/refractionary coughing spells). 20 capsule 0   dextromethorphan-guaiFENesin (MUCINEX DM) 30-600 MG 12hr tablet Take 1 tablet by mouth 2 (two) times daily.     ezetimibe (ZETIA) 10 MG tablet TAKE 1 TABLET BY MOUTH AT BEDTIME FOR CHOLESTEROL. 90 tablet 1   FARXIGA 10 MG TABS tablet Take 10 mg by mouth daily.     fluticasone (FLONASE) 50 MCG/ACT nasal spray Place 1 spray into both nostrils daily. 11.1 mL 1   ipratropium-albuterol (DUONEB) 0.5-2.5 (3) MG/3ML SOLN Take 3 mLs by nebulization every 4 (four) hours as needed (SOB/cough). (Patient taking differently: Take 3 mLs by nebulization in the morning and at bedtime.) 200 mL 0   Lactobacillus (PROBIOTIC ACIDOPHILUS PO) Take 1 capsule by mouth daily.     levocetirizine (XYZAL) 5 MG tablet Take 5 mg by mouth at bedtime.     losartan (COZAAR) 50 MG tablet Take 1 tablet (50 mg total) by mouth daily. Hold if bp is low     metoprolol succinate (TOPROL-XL) 25 MG 24 hr tablet TAKE 1/2 TABLET BY MOUTH DAILY. 45 tablet 1   Multiple Vitamin (MULTIVITAMIN) tablet Take 1 tablet by mouth daily.     pantoprazole (PROTONIX) 40 MG tablet Take 1 tablet (40 mg total) by mouth 2 (two) times daily. 60 tablet 3   Rivaroxaban (XARELTO) 15 MG TABS tablet TAKE (1) TABLET BY MOUTH ONCE DAILY WITH SUPPER.  30 tablet 5   tamsulosin (FLOMAX) 0.4 MG CAPS capsule Take 0.4 mg by mouth daily after supper.     traZODone (DESYREL) 50 MG tablet Take 50 mg by mouth at bedtime.     No current facility-administered medications for this visit.    Allergies   Allergies as of 11/19/2023 - Review Complete 11/19/2023  Allergen Reaction Noted   Ativan [lorazepam] Other (See Comments) 10/31/2023      Review of Systems   General: Negative for anorexia, weight loss, fever, chills, fatigue, weakness. ENT: Negative for hoarseness,  difficulty swallowing , nasal congestion. CV: Negative for chest pain, angina, palpitations, dyspnea on exertion, peripheral edema.  Respiratory: Negative for dyspnea at rest, dyspnea on exertion, cough, sputum, wheezing.  GI: See history of present illness. GU:  Negative for dysuria, hematuria, urinary incontinence, urinary frequency, nocturnal urination.  Endo: Negative for unusual weight change.     Physical Exam   BP 131/77   Pulse 96   Temp (!) 97.5 F (36.4 C)   Ht 5\' 9"  (1.753 m)   Wt 202 lb 3.2 oz (91.7 kg)   BMI 29.86 kg/m    General: Well-nourished, well-developed in no acute distress.  Eyes: No icterus. Mouth: Oropharyngeal mucosa moist and pink  Abdomen: Bowel sounds are normal, nontender, nondistended, no hepatosplenomegaly or masses,  no abdominal bruits or hernia , no rebound or guarding.  Rectal: not Performed Extremities: No lower extremity edema. No clubbing or deformities. Neuro: Alert and oriented x 4   Skin: Warm and dry, no jaundice.   Psych: Alert and cooperative, normal mood and affect.  Labs   Lab Results  Component Value Date   NA 137 11/03/2023   CL 103 11/03/2023   K 4.2 11/03/2023   CO2 25 11/03/2023   BUN 8 11/03/2023   CREATININE 1.54 (H) 11/03/2023   GFRNONAA 46 (L) 11/03/2023   CALCIUM 8.5 (L) 11/03/2023   PHOS 2.5 10/12/2023   ALBUMIN 3.0 (L) 10/31/2023   GLUCOSE 155 (H) 11/03/2023   Lab Results  Component Value Date   ALT 26 10/31/2023   AST 25 10/31/2023   ALKPHOS 60 10/31/2023   BILITOT 0.4 10/31/2023   Lab Results  Component Value Date   WBC 4.6 11/10/2023   HGB 9.7 (L) 11/10/2023   HCT 30.1 (L) 11/10/2023   MCV 99.7 11/10/2023   PLT 288 11/10/2023   No results found for: "IRON", "TIBC", "FERRITIN" No results found for: "VITAMINB12" No results found for: "FOLATE"  Imaging Studies   US Venous Img Lower Bilateral (DVT)  Result Date: 11/01/2023 CLINICAL DATA:  Bilateral lower extremity edema.  Evaluate for DVT.  EXAM: BILATERAL LOWER EXTREMITY VENOUS DOPPLER ULTRASOUND TECHNIQUE: Gray-scale sonography with graded compression, as well as color Doppler and duplex ultrasound were performed to evaluate the lower extremity deep venous systems from the level of the common femoral vein and including the common femoral, femoral, profunda femoral, popliteal and calf veins including the posterior tibial, peroneal and gastrocnemius veins when visible. The superficial great saphenous vein was also interrogated. Spectral Doppler was utilized to evaluate flow at rest and with distal augmentation maneuvers in the common femoral, femoral and popliteal veins. COMPARISON:  None Available. FINDINGS: RIGHT LOWER EXTREMITY Common Femoral Vein: No evidence of thrombus. Normal compressibility, respiratory phasicity and response to augmentation. Saphenofemoral Junction: No evidence of thrombus. Normal compressibility and flow on color Doppler imaging. Profunda Femoral Vein: No evidence of thrombus. Normal compressibility and flow on color Doppler imaging. Femoral Vein:  No evidence of thrombus. Normal compressibility, respiratory phasicity and response to augmentation. Popliteal Vein: No evidence of thrombus. Normal compressibility, respiratory phasicity and response to augmentation. Calf Veins: No evidence of thrombus. Normal compressibility and flow on color Doppler imaging. Superficial Great Saphenous Vein: No evidence of thrombus. Normal compressibility. Other Findings:  None. LEFT LOWER EXTREMITY Common Femoral Vein: No evidence of thrombus. Normal compressibility, respiratory phasicity and response to augmentation. Saphenofemoral Junction: No evidence of thrombus. Normal compressibility and flow on color Doppler imaging. Profunda Femoral Vein: No evidence of thrombus. Normal compressibility and flow on color Doppler imaging. Femoral Vein: No evidence of thrombus. Normal compressibility, respiratory phasicity and response to augmentation.  Popliteal Vein: No evidence of thrombus. Normal compressibility, respiratory phasicity and response to augmentation. Calf Veins: No evidence of thrombus. Normal compressibility and flow on color Doppler imaging. Superficial Great Saphenous Vein: No evidence of thrombus. Normal compressibility. Other Findings: There is a minimal amount of subcutaneous edema at the level of the left calf. IMPRESSION: No evidence of DVT within either lower extremity. Electronically Signed   By: Simonne Come M.D.   On: 11/01/2023 16:58   CT HEAD W & WO CONTRAST ( )  Result Date: 10/29/2023 CLINICAL DATA:  Confusion with dizziness, history of basal cell EXAM: CT HEAD WITHOUT AND WITH CONTRAST TECHNIQUE: Contiguous axial images were obtained from the base of the skull through the vertex without and with intravenous contrast. RADIATION DOSE REDUCTION: This exam was performed according to the departmental dose-optimization program which includes automated exposure control, adjustment of the mA and/or kV according to patient size and/or use of iterative reconstruction technique. CONTRAST:  75mL ISOVUE-300 IOPAMIDOL (ISOVUE-300) INJECTION 61% COMPARISON:  None Available. FINDINGS: Brain: No abnormal parenchymal or meningeal enhancement. No evidence of acute infarction, hemorrhage, mass, mass effect, or midline shift. No hydrocephalus or extra-axial fluid collection. Periventricular white matter changes, likely the sequela of chronic small vessel ischemic disease. Vascular: No hyperdense vessel on precontrast images. Grossly normal arterial and venous enhancement Skull: Negative for fracture or focal lesion. Sinuses/Orbits: Minimal mucosal thickening in the paranasal sinuses. No acute finding in the orbits. Other: The mastoid air cells are well aerated. IMPRESSION: No acute intracranial process. Electronically Signed   By: Wiliam Ke M.D.   On: 10/29/2023 11:13    Assessment/plan:   Acute blood loss anemia/GI bleeding: Extensive  workup as outlined above without any bleeding lesion noted on evaluation.  Likely multifactorial including nosebleeds in the setting of Xarelto. -Continue to monitor for black or bloody stools, frequent nosebleeds -Monitor for worsening fatigue, lightheadedness or shortness of breath -Add ferrous sulfate 325 mg every other day, patient aware that stools may be dark on iron -Continue pantoprazole twice daily for 12 weeks, then continue once daily.  Prescription provided for daily dosing to start after he completes twice daily dosing. -CBC, iron/TIBC/ferritin in 2 to 3 weeks -Return office visit as needed, depending on stability of labs and any new symptoms.  Will plan to see at least once yearly. -Consider EGD and colonoscopy in 5 years for history of adenomatous colon polyps and Barrett's esophagus health permitting.  Leanna Battles. Melvyn Neth, MHS, PA-C [facility]

## 2023-11-21 NOTE — Progress Notes (Unsigned)
Max Davenport, male    DOB: August 12, 1946    MRN: [id number]   Brief patient profile:  45 yowm  never smoker exp to smoke as child with   spring and fall cough/ congestion   referred to pulmonary clinic in [place]  11/22/2023 by Randie Heinz DO  for cough in setting of URI  late August/early September  > no response to allergy rx so saw ENT in [place] severe sinus infection > abx/pred then  hosp with covid [facility] > dc on prednisone / abx      History of Present Illness  11/22/2023  Pulmonary/ 1st office eval/ Garry Bochicchio / [place] [facility] last pred  around 2 week s. prior to OV   Chief Complaint  Patient presents with   Establish Care   Cough  Dyspnea:  back walking downhill to [place] and uphill back to landing 50 y s stopping  Cough: sporadic / has flutter but not using  much  Sleep: fine p trazadone/ tyl pm / melatonin @ bed at 30-45 degrees sleeps fine SABA use: none  02: none   No obvious day to day or daytime pattern/variability or assoc excess/ purulent sputum or mucus plugs or hemoptysis or cp or chest tightness, subjective wheeze or overt   hb symptoms.    Also denies any obvious fluctuation of symptoms with weather or environmental changes or other aggravating or alleviating factors except as outlined above   No unusual exposure hx or h/o childhood pna/ asthma or knowledge of premature birth.  Current Allergies, Complete Past Medical History, Past Surgical History, Family History, and Social History were reviewed in [employer] record.  ROS  The following are not active complaints unless bolded Hoarseness, sore throat, dysphagia, dental problems, itching, sneezing,  nasal congestion or discharge of excess mucus or purulent secretions, ear ache,   fever, chills, sweats, unintended wt loss or wt gain, classically pleuritic or exertional cp,  orthopnea pnd or arm/hand swelling  or leg swelling, presyncope, palpitations, abdominal pain, anorexia, nausea, vomiting,  diarrhea  or change in bowel habits or change in bladder habits, change in stools or change in urine, dysuria, hematuria,  rash, arthralgias, visual complaints, headache, numbness, weakness or ataxia or problems with walking or coordination,  change in mood or  memory.              Outpatient Medications Prior to Visit  Medication Sig Dispense Refill   acetaminophen (TYLENOL) 500 MG tablet Take 500 mg by mouth every 6 (six) hours as needed for mild pain (pain score 1-3).     atorvastatin (LIPITOR) 20 MG tablet Take 20 mg by mouth at bedtime.     dextromethorphan-guaiFENesin (MUCINEX DM) 30-600 MG 12hr tablet Take 1 tablet by mouth 2 (two) times daily.     ezetimibe (ZETIA) 10 MG tablet TAKE 1 TABLET BY MOUTH AT BEDTIME FOR CHOLESTEROL. 90 tablet 1   FARXIGA 10 MG TABS tablet Take 10 mg by mouth daily.     ferrous sulfate 325 (65 FE) MG tablet Take 325 mg by mouth daily with breakfast.     fluticasone (FLONASE) 50 MCG/ACT nasal spray Place 1 spray into both nostrils daily. 11.1 mL 1   ipratropium-albuterol (DUONEB) 0.5-2.5 (3) MG/3ML SOLN Take 3 mLs by nebulization every 4 (four) hours as needed (SOB/cough). (Patient taking differently: Take 3 mLs by nebulization in the morning and at bedtime.) 200 mL 0   Lactobacillus (PROBIOTIC ACIDOPHILUS PO) Take 1 capsule by mouth daily.  levocetirizine (XYZAL) 5 MG tablet Take 5 mg by mouth at bedtime.     losartan (COZAAR) 50 MG tablet Take 1 tablet (50 mg total) by mouth daily. Hold if bp is low     meclizine (ANTIVERT) 25 MG tablet Take 25 mg by mouth 3 (three) times daily as needed for dizziness.     metoprolol succinate (TOPROL-XL) 25 MG 24 hr tablet TAKE 1/2 TABLET BY MOUTH DAILY. 45 tablet 1   Multiple Vitamin (MULTIVITAMIN) tablet Take 1 tablet by mouth daily.     pantoprazole (PROTONIX) 40 MG tablet Take 1 tablet (40 mg total) by mouth 2 (two) times daily. 60 tablet 3   Rivaroxaban (XARELTO) 15 MG TABS tablet TAKE (1) TABLET BY MOUTH ONCE  DAILY WITH SUPPER. 30 tablet 5   tamsulosin (FLOMAX) 0.4 MG CAPS capsule Take 0.4 mg by mouth daily after supper.     traZODone (DESYREL) 50 MG tablet Take 50 mg by mouth at bedtime.     pantoprazole (PROTONIX) 40 MG tablet Take 1 tablet (40 mg total) by mouth daily before breakfast. Start after you complete your 3 months of twice daily dosing. (Patient not taking: Reported on 11/22/2023) 90 tablet 3   benzonatate (TESSALON) 200 MG capsule Take 1 capsule (200 mg total) by mouth 3 (three) times daily as needed (uncontrolled/refractionary coughing spells). 20 capsule 0   No facility-administered medications prior to visit.    Past Medical History:  Diagnosis Date   Atrial fibrillation [facility])    Dysrhythmia    Essential hypertension    Hyperlipidemia    Skin cancer, basal cell    Type 2 diabetes mellitus (HCC)       Objective:     BP 136/81   Pulse 93   Ht 5\' 9"  (1.753 m)   Wt 202 lb (91.6 kg)   SpO2 97%   BMI 29.83 kg/m   SpO2: 97 % RA   Somber amb wm slt nasal tone to voice    HEENT : Oropharynx  clear      Nasal turbinates increase in MP secretions    NECK :  without  apparent JVD/ palpable Nodes/TM    LUNGS: no acc muscle use,  Nl contour chest which is clear to A and P bilaterally without cough on insp or exp maneuvers   CV:  RRR  no s3 or murmur or increase in P2, and no edema   ABD:  soft and nontender with nl inspiratory excursion in the supine position. No bruits or organomegaly appreciated   MS:  Nl gait/ ext warm without deformities Or obvious joint restrictions  calf tenderness, cyanosis or clubbing    SKIN: warm and dry without lesions    NEURO:  alert, approp, nl sensorium with  no motor or cerebellar deficits apparent.       Assessment   No problem-specific Assessment & Plan notes found for this encounter.     Sandrea Hughs, MD 11/22/2023

## 2023-11-22 ENCOUNTER — Encounter: Payer: Self-pay | Admitting: Internal Medicine

## 2023-11-22 ENCOUNTER — Ambulatory Visit (INDEPENDENT_AMBULATORY_CARE_PROVIDER_SITE_OTHER): Payer: HMO | Admitting: Internal Medicine

## 2023-11-22 VITALS — BP 136/81 | HR 93 | Ht 69.0 in | Wt 202.0 lb

## 2023-11-22 DIAGNOSIS — R0609 Other forms of dyspnea: Secondary | ICD-10-CM

## 2023-11-22 NOTE — Patient Instructions (Signed)
Continue protonix  Take 30- 60 min before your first and last meals of the day   GERD (REFLUX)  is an extremely common cause of respiratory symptoms just like yours , many times with no obvious heartburn at all.    It can be treated with medication, but also with lifestyle changes including elevation of the head of your bed (ideally with 6 -8inch blocks under the headboard of your bed),  Smoking cessation, avoidance of late meals, excessive alcohol, and avoid fatty foods, chocolate, peppermint, colas, red wine, and acidic juices such as orange juice.  NO MINT OR MENTHOL PRODUCTS SO NO COUGH DROPS  USE SUGARLESS CANDY INSTEAD (Jolley ranchers or Stover's or Life Savers) or even ice chips will also do - the key is to swallow to prevent all throat clearing. NO OIL BASED VITAMINS - use powdered substitutes.  Avoid fish oil when coughing.   To get the most out of exercise, you need to be continuously aware that you are short of breath, but never out of breath, for at least 30 minutes daily. As you improve, it will actually be easier for you to do the same amount of exercise  in  30 minutes so always push to the level where you are short of breath.     For cough/ congestion > mucinex or mucinex dm  up to maximum of  1200 mg every 12 hours and use the flutter valve as much as you can    Please remember to go to the lab department   for your tests - we will call you with the results when they are available.      Please schedule a follow up office visit in 6 weeks, call sooner if needed

## 2023-11-22 NOTE — Assessment & Plan Note (Signed)
Onset with ? URI/ sinusitis fall 2024 then covid  pna  - CT chest 10/12/23  Multifocal airspace opacities. This may represent multifocal pneumonia. In the setting of chronic symptoms, differential considerations include cryptogenic organizing pneumonia and chronic eosinophilic pneumonia. - Sinus CT  10/27/23 mild mucosal  thickening only  - 11/22/2023   Walked on RA  x  3  lap(s) =  approx 450  ft  @ mod pace, stopped due to end of study  with lowest 02 sats 95% and no sob   Agree with ddx considerations but since trending toward improvement even off prednisone I strongly suspect organizing pna from covid which occurred in setting of ongoing poorly controlled rhinitis/ pnds   For now rec For cough/ congestion > mucinex or mucinex dm  up to maximum of  1200 mg every 12 hours and use the flutter valve as much as you can    Discussed in detail all the  indications, usual  risks and alternatives  relative to the benefits with patient who agrees to proceed with conservative rx and f/u as follows    Sub max ex and monitor sats at peak ex  Return in 6 weeks, sooner if needed for final pulmonary eval  Keep appts with ENT/allergy as planned  Each maintenance medication was reviewed in detail including emphasizing most importantly the difference between maintenance and prns and under what circumstances the prns are to be triggered using an action plan format where appropriate.  Total time for H and P, chart review, counseling, reviewing hfa/neb/pulse ox/flutter device(s) , directly observing portions of ambulatory 02 saturation study/ and generating customized AVS unique to this office visit / same day charting = complex new pt eval

## 2023-11-23 DIAGNOSIS — R0609 Other forms of dyspnea: Secondary | ICD-10-CM | POA: Diagnosis not present

## 2023-11-25 LAB — CBC WITH DIFFERENTIAL/PLATELET
Basophils Absolute: 0.1 10*3/uL (ref 0.0–0.2)
Basos: 1 %
EOS (ABSOLUTE): 0.1 10*3/uL (ref 0.0–0.4)
Eos: 1 %
Hematocrit: 34.1 % — ABNORMAL LOW (ref 37.5–51.0)
Hemoglobin: 11 g/dL — ABNORMAL LOW (ref 13.0–17.7)
Immature Grans (Abs): 0 10*3/uL (ref 0.0–0.1)
Immature Granulocytes: 0 %
Lymphocytes Absolute: 1.4 10*3/uL (ref 0.7–3.1)
Lymphs: 21 %
MCH: 30.6 pg (ref 26.6–33.0)
MCHC: 32.3 g/dL (ref 31.5–35.7)
MCV: 95 fL (ref 79–97)
Monocytes Absolute: 0.5 10*3/uL (ref 0.1–0.9)
Monocytes: 8 %
Neutrophils Absolute: 4.7 10*3/uL (ref 1.4–7.0)
Neutrophils: 69 %
Platelets: 280 10*3/uL (ref 150–450)
RBC: 3.6 x10E6/uL — ABNORMAL LOW (ref 4.14–5.80)
RDW: 13.7 % (ref 11.6–15.4)
WBC: 6.8 10*3/uL (ref 3.4–10.8)

## 2023-11-25 LAB — BASIC METABOLIC PANEL
BUN/Creatinine Ratio: 12 (ref 10–24)
BUN: 20 mg/dL (ref 8–27)
CO2: 21 mmol/L (ref 20–29)
Calcium: 9.1 mg/dL (ref 8.6–10.2)
Chloride: 106 mmol/L (ref 96–106)
Creatinine, Ser: 1.7 mg/dL — ABNORMAL HIGH (ref 0.76–1.27)
Glucose: 150 mg/dL — ABNORMAL HIGH (ref 70–99)
Potassium: 4.4 mmol/L (ref 3.5–5.2)
Sodium: 144 mmol/L (ref 134–144)
eGFR: 41 mL/min/{1.73_m2} — ABNORMAL LOW (ref >59)

## 2023-11-25 LAB — IGE: IgE (Immunoglobulin E), Serum: 414 [IU]/mL (ref 6–495)

## 2023-11-25 LAB — SEDIMENTATION RATE: Sed Rate: 23 mm/h (ref 0–30)

## 2023-11-25 LAB — BRAIN NATRIURETIC PEPTIDE: BNP: 147.3 pg/mL — ABNORMAL HIGH (ref 0.0–100.0)

## 2023-11-30 ENCOUNTER — Ambulatory Visit: Payer: Self-pay | Admitting: *Deleted

## 2023-11-30 NOTE — Patient Outreach (Signed)
  Care Coordination   11/30/2023 Name: Max Davenport MRN: [id number] DOB: 1946/01/07   Care Coordination Outreach Attempts:  An unsuccessful outreach was attempted for an appointment today.  Follow Up Plan:  Additional outreach attempts will be made to offer the patient complex care management information and services.   Encounter Outcome:  No Answer   Care Coordination Interventions:  No, not indicated    Serine Kea L. Noelle Penner, RN, BSN, [facility]  [facility] Care Management Coordinator  [phone]  Fax: [phone]

## 2023-12-12 ENCOUNTER — Other Ambulatory Visit: Payer: Self-pay | Admitting: Student

## 2023-12-14 DIAGNOSIS — K2270 Barrett's esophagus without dysplasia: Secondary | ICD-10-CM | POA: Diagnosis not present

## 2023-12-14 DIAGNOSIS — Z860101 Personal history of adenomatous and serrated colon polyps: Secondary | ICD-10-CM | POA: Diagnosis not present

## 2023-12-14 DIAGNOSIS — K922 Gastrointestinal hemorrhage, unspecified: Secondary | ICD-10-CM | POA: Diagnosis not present

## 2023-12-14 DIAGNOSIS — D62 Acute posthemorrhagic anemia: Secondary | ICD-10-CM | POA: Diagnosis not present

## 2023-12-15 LAB — CBC WITH DIFFERENTIAL/PLATELET
Absolute Lymphocytes: 1276 {cells}/uL (ref 850–3900)
Absolute Monocytes: 523 {cells}/uL (ref 200–950)
Basophils Absolute: 39 {cells}/uL (ref 0–200)
Basophils Relative: 0.7 %
Eosinophils Absolute: 209 {cells}/uL (ref 15–500)
Eosinophils Relative: 3.8 %
HCT: 40.9 % (ref 38.5–50.0)
Hemoglobin: 12.8 g/dL — ABNORMAL LOW (ref 13.2–17.1)
MCH: 29 pg (ref 27.0–33.0)
MCHC: 31.3 g/dL — ABNORMAL LOW (ref 32.0–36.0)
MCV: 92.7 fL (ref 80.0–100.0)
MPV: 10.3 fL (ref 7.5–12.5)
Monocytes Relative: 9.5 %
Neutro Abs: 3454 {cells}/uL (ref 1500–7800)
Neutrophils Relative %: 62.8 %
Platelets: 214 10*3/uL (ref 140–400)
RBC: 4.41 10*6/uL (ref 4.20–5.80)
RDW: 13.7 % (ref 11.0–15.0)
Total Lymphocyte: 23.2 %
WBC: 5.5 10*3/uL (ref 3.8–10.8)

## 2023-12-15 LAB — IRON,TIBC AND FERRITIN PANEL
%SAT: 12 % — ABNORMAL LOW (ref 20–48)
Ferritin: 24 ng/mL (ref 24–380)
Iron: 49 ug/dL — ABNORMAL LOW (ref 50–180)
TIBC: 396 ug/dL (ref 250–425)

## 2023-12-16 ENCOUNTER — Other Ambulatory Visit: Payer: Self-pay

## 2023-12-16 DIAGNOSIS — D62 Acute posthemorrhagic anemia: Secondary | ICD-10-CM

## 2023-12-17 ENCOUNTER — Ambulatory Visit: Payer: Self-pay | Admitting: *Deleted

## 2023-12-17 NOTE — Patient Instructions (Addendum)
 Visit Information  Thank you for taking time to visit with me today. Please don't hesitate to contact me if I can be of assistance to you.    To increase your hemoglobin levels, you can: Eat iron-rich foods Iron is a key nutrient for hemoglobin production. Foods that are high in iron include:  Red meat, pork, and poultry  Seafood  Beans, lentils, and tofu  Dark green leafy vegetables, such as spinach, kale, collard greens, and chard  Dried fruits such as raisins, apricots, and peaches  Iron-fortified cereals, breads, and pastas    Take an iron supplement An oral iron supplement can increase the levels of iron and hemoglobin in your body.    Exercise Moderate to high intensity workouts can increase hemoglobin levels. When you exercise, your body produces more hemoglobin to meet the increasing demand for oxygen throughout the body.    Eat foods rich in vitamin A and vitamin E Vitamin A is important for hemoglobin synthesis, and vitamin E helps protect the integrity of red blood cells. Foods that are rich in vitamin A and vitamin E include:  Almonds  Sunflower seeds  Spinach and broccoli  Kiwi and mango   Drink plenty of water Drink plenty of water unless your doctor tells you to limit your fluids  Following are the goals we discussed today:   Goals Addressed             This Visit's Progress    Patient will have improved hemaglobin and respiratory symptoms RN CM care coordinator   On track    Interventions Today    Flowsheet Row Most Recent Value  Chronic Disease   Chronic disease during today's visit Atrial Fibrillation (AFib), Other  [Anemia, constipation, atrial fibrillation, medicines, urinary flow]  General Interventions   General Interventions Discussed/Reviewed General Interventions Reviewed, Doctor Visits  [discussed the benefit of care coordination & MD visits]  Doctor Visits Discussed/Reviewed PCP, Specialist, Doctor Visits Reviewed  PCP/Specialist Visits  Compliance with follow-up visit  Exercise Interventions   Exercise Discussed/Reviewed Exercise Reviewed, Physical Activity  Physical Activity Discussed/Reviewed Physical Activity Reviewed, Types of exercise, Home Exercise Program (HEP)  Education Interventions   Education Provided Provided Web-based Education, Provided Education  [Anemia, atrial fibrillation, medicines, urinary flow, vitamin A & E to help absorption of iron]  Provided Verbal Education On Nutrition, Exercise, Medication, Other  Mental Health Interventions   Mental Health Discussed/Reviewed Mental Health Reviewed, Coping Strategies  Nutrition Interventions   Nutrition Discussed/Reviewed Nutrition Reviewed, Fluid intake, Increasing proteins  Pharmacy Interventions   Pharmacy Dicussed/Reviewed Pharmacy Topics Reviewed, Medications and their functions, Affording Medications  [medicines side effects, the best time to take cholesterol medicine. medicine interactions, developing a relationship with local and office pharmacists]  Safety Interventions   Safety Discussed/Reviewed Safety Discussed, Fall Risk  [Disussed that standing to complete some exercises needs to changed to chair exercise to prevent dizziness that may lead to falls]              Our next appointment is by telephone on 01/19/24 at 2:30 pm  Please call the care guide team at [phone] if you need to cancel or reschedule your appointment.   If you are experiencing a Mental Health or Behavioral Health Crisis or need someone to talk to, please call the Suicide and Crisis Lifeline: 988 call the [place]  National Suicide Prevention Lifeline: 902-530-8331 or TTY: 720-208-5423 TTY 805 057 7455) to talk to a trained counselor call 1-800-273-TALK (toll free, 24 hour hotline) call the [place]  [facility]: (408) 071-0655 call 911   Patient verbalizes understanding of instructions and care plan provided today and agrees to view in MyChart. Active MyChart status  and patient understanding of how to access instructions and care plan via MyChart confirmed with patient.     The patient has been provided with contact information for the care management team and has been advised to call with any health related questions or concerns.   Jaiona Simien L. Ramonita, RN, BSN, [facility]  VBCI Care Management Coordinator  [phone]  Fax: [phone]

## 2023-12-17 NOTE — Patient Outreach (Signed)
 Care Coordination   Follow Up Visit Note   12/17/2023 Name: Max Davenport MRN: [id number] DOB: [birth year]  Max CHRISTELLA Mathia is a 78 y.o. year old male who sees Shona, Norleen PEDLAR, MD for primary care. I spoke with  Max Davenport by phone today.  What matters to the patients health and wellness today?  Anemia, constipation, atrial fibrillation, medicines, urinary flow     Since December 2024's outreach, Mr Born confirms that he is gradually beginning to feel better. He completed labs on December 14, 2023, that indicates his hemoglobin has increased to 12.8. It was previously as low as the 7's. He will continue to take his prescribed iron every other day for three months and will have another follow up. He reports that his Constipation has resolved. He is no longer having irregular, dark stools.  Various interventions to incorporate at home were discussed to help with improving his anemia to include taking his prescribed iron, monitoring for Constipation, increase activity, increasing fluid intake plus taking vitamins A & E to help his iron absorb better.  Atrial Fibrillation- Mr. Max Davenport with no chest pain or fluttering. He does confirm episodes of shortness of breath & fatigue when he goes out of the home generally once a week. He will continue to monitor for chest pain & fluttering symptoms plus sweating and difficulty swallowing. He voices understanding that each patient may have individual symptoms. He did have cardiac rehab exercises that he does in his chair but is cautious when standing to complete these as the sometime lead to dizziness. He does not want to fall  Mr. Max Davenport voiced appreciation of care coordination services. He appreciates the time provided to ask questions about his medical conditions. He also has access to his telephonic insurance care coordination provider services. He reports being released by the nurse recently.   He agrees to follow up to review urine retention, stop and go  urine streams.  Goals Addressed             This Visit's Progress    Patient will have improved hemaglobin and respiratory symptoms RN CM care coordinator   On track    Interventions Today    Flowsheet Row Most Recent Value  Chronic Disease   Chronic disease during today's visit Atrial Fibrillation (AFib), Other  [Anemia, constipation, atrial fibrillation, medicines, urinary flow]  General Interventions   General Interventions Discussed/Reviewed General Interventions Reviewed, Doctor Visits  [discussed the benefit of care coordination & MD visits]  Doctor Visits Discussed/Reviewed PCP, Specialist, Doctor Visits Reviewed  PCP/Specialist Visits Compliance with follow-up visit  Exercise Interventions   Exercise Discussed/Reviewed Exercise Reviewed, Physical Activity  Physical Activity Discussed/Reviewed Physical Activity Reviewed, Types of exercise, Home Exercise Program (HEP)  Education Interventions   Education Provided Provided Web-based Education, Provided Education  [Anemia, atrial fibrillation, medicines, urinary flow, vitamin A & E to help absorption of iron]  Provided Verbal Education On Nutrition, Exercise, Medication, Other  Mental Health Interventions   Mental Health Discussed/Reviewed Mental Health Reviewed, Coping Strategies  Nutrition Interventions   Nutrition Discussed/Reviewed Nutrition Reviewed, Fluid intake, Increasing proteins  Pharmacy Interventions   Pharmacy Dicussed/Reviewed Pharmacy Topics Reviewed, Medications and their functions, Affording Medications  [medicines side effects, the best time to take cholesterol medicine. medicine interactions, developing a relationship with local and office pharmacists]  Safety Interventions   Safety Discussed/Reviewed Safety Discussed, Fall Risk  [Disussed that standing to complete some exercises needs to changed to chair exercise to prevent dizziness that  may lead to falls]              SDOH assessments and  interventions completed:  No     Care Coordination Interventions:  Yes, provided   Follow up plan: Follow up call scheduled for 01/19/24 2:30 pm    Encounter Outcome:  Patient Visit Completed   Yania Bogie L. Ramonita, RN, BSN, [facility]  VBCI Care Management Coordinator  [phone]  Fax: [phone]

## 2023-12-23 DIAGNOSIS — Z23 Encounter for immunization: Secondary | ICD-10-CM | POA: Diagnosis not present

## 2024-01-06 ENCOUNTER — Other Ambulatory Visit: Payer: Self-pay | Admitting: Cardiology

## 2024-01-10 ENCOUNTER — Ambulatory Visit: Payer: HMO | Admitting: Internal Medicine

## 2024-01-13 ENCOUNTER — Other Ambulatory Visit: Payer: Self-pay

## 2024-01-13 MED ORDER — PANTOPRAZOLE SODIUM 40 MG PO TBEC: 40.0000 mg | DELAYED_RELEASE_TABLET | Freq: Two times a day (BID) | ORAL | 0 refills | Status: DC

## 2024-01-19 ENCOUNTER — Ambulatory Visit: Payer: Self-pay | Admitting: *Deleted

## 2024-01-19 NOTE — Patient Instructions (Signed)
Visit Information  Thank you for taking time to visit with me today. Please don't hesitate to contact me if I can be of assistance to you.   Following are the goals we discussed today:   Goals Addressed             This Visit's Progress    Patient will have improved hemaglobin and respiratory symptoms RN CM care coordinator   On track    Interventions Today    Flowsheet Row Most Recent Value  Chronic Disease   Chronic disease during today's visit Diabetes, Hypertension (HTN), Atrial Fibrillation (AFib), Other  [anemia, fatigue- assessed for any worsening symptoms Encouraged to moinitor cbg values & discussed checking when he has noted symptoms (dizziness, thrist, sleepiness, increase voiding, confusion, listliness, increase fatique+)]  General Interventions   General Interventions Discussed/Reviewed General Interventions Reviewed  Doctor Visits Discussed/Reviewed Doctor Visits Reviewed, PCP  PCP/Specialist Visits Compliance with follow-up visit  Exercise Interventions   Exercise Discussed/Reviewed Exercise Reviewed, Physical Activity, Weight Managment  [reports becoming more active]  Physical Activity Discussed/Reviewed Physical Activity Reviewed, Types of exercise  [increased walking in stores or out of the house]  Weight Management Weight maintenance  [discussed monitoring of increased weight]  Education Interventions   Education Provided Provided Education, Provided Web-based Education  Provided Verbal Education On Labs, Blood Sugar Monitoring, Exercise, When to see the doctor  Labs Reviewed Hgb A1c  [iron studies]  Mental Health Interventions   Mental Health Discussed/Reviewed Mental Health Reviewed, Coping Strategies  Nutrition Interventions   Nutrition Discussed/Reviewed Nutrition Reviewed, Fluid intake, Decreasing sugar intake  Pharmacy Interventions   Pharmacy Dicussed/Reviewed Pharmacy Topics Reviewed, Affording Medications  Advanced Directive Interventions   Advanced  Directives Discussed/Reviewed Advanced Directives Discussed  [no interested at this time]              Our next appointment is by telephone on 02/02/24 at 2:30 pm  Please call the care guide team at [phone] if you need to cancel or reschedule your appointment.   If you are experiencing a Mental Health or Behavioral Health Crisis or need someone to talk to, please call the Suicide and Crisis Lifeline: 988 call the [place] National Suicide Prevention Lifeline: 727 793 1060 or TTY: 780-055-1536 TTY 878-609-8754) to talk to a trained counselor call 1-800-273-TALK (toll free, 24 hour hotline) call the [facility]: 215-844-3469 call 911   Patient verbalizes understanding of instructions and care plan provided today and agrees to view in MyChart. Active MyChart status and patient understanding of how to access instructions and care plan via MyChart confirmed with patient.     The patient has been provided with contact information for the care management team and has been advised to call with any health related questions or concerns.   Nandi Tonnesen L. Noelle Penner, RN, BSN, CCM [place]  [facility], [facility] Health RN Care Manager Direct Dial: [phone]  Fax: [phone] Mailing Address: [address]  [place] [address] Website: [email or web address]

## 2024-01-19 NOTE — Patient Outreach (Addendum)
  Care Coordination   Follow Up Visit Note   01/19/2024 Name: Max Davenport MRN: [id number] DOB: 1946/05/21  Max Davenport is a 78 y.o. year old male who sees Shona, Norleen PEDLAR, MD for primary care. I spoke with  Max Davenport by phone today.  What matters to the patients health and wellness today?  Fatigue,  home iron management He states he is gaining energy & weight   [name] reports that he does not have any worsening symptoms today. Confirrms he is not checking his cbg values as I should  He reports that he continues to have some fatigue but notice that it's getting better. He denies concerns or worsening symptoms like elevated blood sugars. He denies chest pain, fluttering in his chest. No nausea, vomiting or heartburn symptoms. He states that he is becoming more active as evidence by increase walking throughout stores. He is getting out of the house more often. He has a follow up appointment with his PCP on February the 14 th 2025 to complete labs and then a follow up visit with the provider the week after that.  He will outreach to RN CM if he begins to have any worsening symptoms.   Goals Addressed             This Visit's Progress    Patient will have improved hemaglobin and respiratory symptoms RN CM care coordinator   On track    Interventions Today    Flowsheet Row Most Recent Value  Chronic Disease   Chronic disease during today's visit Diabetes, Hypertension (HTN), Atrial Fibrillation (AFib), Other  [anemia, fatigue- assessed for any worsening symptoms Encouraged to moinitor cbg values & discussed checking when he has noted symptoms (dizziness, thrist, sleepiness, increase voiding, confusion, listliness, increase fatique+)]  General Interventions   General Interventions Discussed/Reviewed General Interventions Reviewed  Doctor Visits Discussed/Reviewed Doctor Visits Reviewed, PCP  PCP/Specialist Visits Compliance with follow-up visit  Exercise Interventions   Exercise  Discussed/Reviewed Exercise Reviewed, Physical Activity, Weight Managment  [reports becoming more active]  Physical Activity Discussed/Reviewed Physical Activity Reviewed, Types of exercise  [increased walking in stores or out of the house]  Weight Management Weight maintenance  [discussed monitoring of increased weight]  Education Interventions   Education Provided Provided Education, Provided Web-based Education  Provided Verbal Education On Labs, Blood Sugar Monitoring, Exercise, When to see the doctor  Labs Reviewed Hgb A1c  [iron studies]  Mental Health Interventions   Mental Health Discussed/Reviewed Mental Health Reviewed, Coping Strategies  Nutrition Interventions   Nutrition Discussed/Reviewed Nutrition Reviewed, Fluid intake, Decreasing sugar intake  Pharmacy Interventions   Pharmacy Dicussed/Reviewed Pharmacy Topics Reviewed, Affording Medications  Advanced Directive Interventions   Advanced Directives Discussed/Reviewed Advanced Directives Discussed  [no interested at this time]              SDOH assessments and interventions completed:  No     Care Coordination Interventions:  Yes, provided   Follow up plan: Follow up call scheduled for 02/02/24    Encounter Outcome:  Patient Visit Completed   Suzen L. Ramonita, RN, BSN, CCM [place]  [facility], [facility] Health RN Care Manager Direct Dial: [phone]  Fax: [phone] Mailing Address: [address]  [place] [address] Website: [email or web address]

## 2024-01-28 DIAGNOSIS — E782 Mixed hyperlipidemia: Secondary | ICD-10-CM | POA: Diagnosis not present

## 2024-01-28 DIAGNOSIS — E1169 Type 2 diabetes mellitus with other specified complication: Secondary | ICD-10-CM | POA: Diagnosis not present

## 2024-01-28 DIAGNOSIS — R809 Proteinuria, unspecified: Secondary | ICD-10-CM | POA: Diagnosis not present

## 2024-02-02 ENCOUNTER — Ambulatory Visit: Payer: Self-pay | Admitting: *Deleted

## 2024-02-02 NOTE — Patient Instructions (Addendum)
 Visit Information  Thank you for taking time to visit with me today. Please don't hesitate to contact me if I can be of assistance to you.   Following are the goals we discussed today:   Goals Addressed             This Visit's Progress    Patient will have improved hemaglobin and respiratory symptoms RN CM care coordinator   On track    Patient will have a hemaglobin within normal limits Patient will maintain a within normal weight and report weight gains of 3-5 lbs     Interventions Today    Flowsheet Row Most Recent Value  Chronic Disease   Chronic disease during today's visit Diabetes, Congestive Heart Failure (CHF)  General Interventions   General Interventions Discussed/Reviewed General Interventions Reviewed, Labs, Doctor Visits  Labs Hgb A1c every 3 months  Doctor Visits Discussed/Reviewed Doctor Visits Reviewed, PCP  [to be seen at pcp office on 02/03/24]  Durable Medical Equipment (DME) Other  [Scales assessed he has gained weight Reports a weight of 205 lbs]  PCP/Specialist Visits Compliance with follow-up visit  Exercise Interventions   Exercise Discussed/Reviewed Exercise Reviewed, Physical Activity  Physical Activity Discussed/Reviewed Physical Activity Reviewed  Weight Management Weight maintenance  Education Interventions   Education Provided Provided Education  [importance of compression socks]  Provided Verbal Education On Nutrition, Foot Care, Medication              Our next appointment is by telephone on 03/01/24 at 1:15 pm  Please call the care guide team at [phone] if you need to cancel or reschedule your appointment.   If you are experiencing a Mental Health or Behavioral Health Crisis or need someone to talk to, please call the Suicide and Crisis Lifeline: 988 call the [place] National Suicide Prevention Lifeline: 254 051 7983 or TTY: (415)502-3379 TTY (220) 069-8144) to talk to a trained counselor call 1-800-273-TALK (toll free, 24 hour  hotline) call the [facility] (782)036-9667 call 911   Patient verbalizes understanding of instructions and care plan provided today and agrees to view in MyChart. Active MyChart status and patient understanding of how to access instructions and care plan via MyChart confirmed with patient.     The patient has been provided with contact information for the care management team and has been advised to call with any health related questions or concerns.   Kathreen Cosier. Noelle Penner, RN, BSN, CCM [place]  [facility], [facility] Health RN Care Manager Direct Dial: [phone]  Fax: [phone] Mailing Address: [address]  [place] [address] Website: [email or web address]

## 2024-02-02 NOTE — Patient Outreach (Signed)
  Care Coordination   Follow Up Visit Note   02/02/2024 Name: Max Davenport MRN: [id number] DOB: 14-Sep-1946  Max Davenport is a 78 y.o. year old male who sees Margo Aye, Kathleene Hazel, MD for primary care. I spoke with  Max Ano Halabi by phone today.  What matters to the patients health and wellness today?  Improvements in Hemaglobin, HgA1c, fatigue/activity     Goals Addressed             This Visit's Progress    Patient will have improved hemaglobin and respiratory symptoms RN CM care coordinator       Patient will have a hemaglobin within normal limits Patient will maintain a within normal weight and report weight gains of 3-5 lbs     Interventions Today    Flowsheet Row Most Recent Value  Chronic Disease   Chronic disease during today's visit Diabetes, Congestive Heart Failure (CHF)  General Interventions   General Interventions Discussed/Reviewed General Interventions Reviewed, Labs, Doctor Visits  Labs Hgb A1c every 3 months  Doctor Visits Discussed/Reviewed Doctor Visits Reviewed, PCP  [to be seen at pcp office on 02/03/24]  Durable Medical Equipment (DME) Other  [Scales assessed he has gained weight Reports a weight of 205 lbs]  PCP/Specialist Visits Compliance with follow-up visit  Exercise Interventions   Exercise Discussed/Reviewed Exercise Reviewed, Physical Activity  Physical Activity Discussed/Reviewed Physical Activity Reviewed  Weight Management Weight maintenance  Education Interventions   Education Provided Provided Education  [importance of compression socks]  Provided Verbal Education On Nutrition, Foot Care, Medication              SDOH assessments and interventions completed:  No     Care Coordination Interventions:  Yes, provided   Follow up plan: Follow up call scheduled for 03/01/24    Encounter Outcome:  Patient Visit Completed   Cala Bradford L. Noelle Penner, RN, BSN, CCM [place]  [facility], [facility] Health RN Care Manager Direct  Dial: [phone]  Fax: [phone] Mailing Address: [address]  [place] [address] Website: [email or web address]

## 2024-02-03 DIAGNOSIS — E782 Mixed hyperlipidemia: Secondary | ICD-10-CM | POA: Diagnosis not present

## 2024-02-03 DIAGNOSIS — N189 Chronic kidney disease, unspecified: Secondary | ICD-10-CM | POA: Diagnosis not present

## 2024-02-03 DIAGNOSIS — E785 Hyperlipidemia, unspecified: Secondary | ICD-10-CM | POA: Diagnosis not present

## 2024-02-03 DIAGNOSIS — E1122 Type 2 diabetes mellitus with diabetic chronic kidney disease: Secondary | ICD-10-CM | POA: Diagnosis not present

## 2024-02-03 DIAGNOSIS — E669 Obesity, unspecified: Secondary | ICD-10-CM | POA: Diagnosis not present

## 2024-02-03 DIAGNOSIS — E1169 Type 2 diabetes mellitus with other specified complication: Secondary | ICD-10-CM | POA: Diagnosis not present

## 2024-02-03 DIAGNOSIS — N400 Enlarged prostate without lower urinary tract symptoms: Secondary | ICD-10-CM | POA: Diagnosis not present

## 2024-02-03 DIAGNOSIS — I129 Hypertensive chronic kidney disease with stage 1 through stage 4 chronic kidney disease, or unspecified chronic kidney disease: Secondary | ICD-10-CM | POA: Diagnosis not present

## 2024-02-03 DIAGNOSIS — R809 Proteinuria, unspecified: Secondary | ICD-10-CM | POA: Diagnosis not present

## 2024-02-03 DIAGNOSIS — D751 Secondary polycythemia: Secondary | ICD-10-CM | POA: Diagnosis not present

## 2024-02-03 DIAGNOSIS — G4700 Insomnia, unspecified: Secondary | ICD-10-CM | POA: Diagnosis not present

## 2024-02-03 DIAGNOSIS — I4819 Other persistent atrial fibrillation: Secondary | ICD-10-CM | POA: Diagnosis not present

## 2024-03-01 ENCOUNTER — Encounter: Payer: Self-pay | Admitting: *Deleted

## 2024-03-01 ENCOUNTER — Ambulatory Visit: Payer: Self-pay | Admitting: *Deleted

## 2024-03-01 NOTE — Patient Instructions (Addendum)
 Visit Information  Thank you for taking time to visit with me today. Please don't hesitate to contact me if I can be of assistance to you.   Following are the goals we discussed today:   Goals Addressed             This Visit's Progress    Patient will have improved sleep and respiratory symptoms RN CM care coordinator   On track    Patient will have a hemaglobin within normal limits - goal met Hemoglobin (15.3 on 01/28/24) and weight. He is no longer prescribed iron pills.- 03/01/24  Patient will maintain a within normal weight and report weight gains of 3-5 lbs 03/01/24 Patient will have improvement in sleep 4+ hours of sleep reported 03/01/24     Interventions Today    Flowsheet Row Most Recent Value  Chronic Disease   Chronic disease during today's visit Other  [poor sleep, medicines, lifestyle & medicine changes]  General Interventions   General Interventions Discussed/Reviewed General Interventions Reviewed, Communication with, Doctor Visits  Doctor Visits Discussed/Reviewed Doctor Visits Reviewed, Specialist, PCP  Annabell Sabal apointment with Cardiology & needed appointments with pcp/ ENT/pulmonology]  PCP/Specialist Visits Compliance with follow-up visit  Communication with PCP/Specialists, Pharmacists, RN  Exercise Interventions   Exercise Discussed/Reviewed Exercise Reviewed, Physical Activity, Weight Managment  Physical Activity Discussed/Reviewed Physical Activity Reviewed  Weight Management Weight maintenance  Education Interventions   Education Provided Provided Web-based Education, Provided Education  [insomina, medicines with side effect of fatigue/drowsiness, administration times of medicines, lifestyle changes to assist with sleep]  Provided Verbal Education On --  [fatigue]  Mental Health Interventions   Mental Health Discussed/Reviewed Mental Health Reviewed, Coping Strategies  Pharmacy Interventions   Pharmacy Dicussed/Reviewed Pharmacy Topics Reviewed,  Medications and their functions              Our next appointment is by telephone on 05/04/24 at 2 pm  Please call the care guide team at [phone] if you need to cancel or reschedule your appointment.   If you are experiencing a Mental Health or Behavioral Health Crisis or need someone to talk to, please call the Suicide and Crisis Lifeline: 988 call the [place] National Suicide Prevention Lifeline: 9472517081 or TTY: (254)692-0080 TTY 534-793-8171) to talk to a trained counselor call 1-800-273-TALK (toll free, 24 hour hotline) call the [facility]: 575-811-2659 call 911   Patient verbalizes understanding of instructions and care plan provided today and agrees to view in MyChart. Active MyChart status and patient understanding of how to access instructions and care plan via MyChart confirmed with patient.     The patient has been provided with contact information for the care management team and has been advised to call with any health related questions or concerns.   Zakyria Metzinger L. Noelle Penner, RN, BSN, CCM [place]  [facility], [facility] Health RN Care Manager Direct Dial: [phone]  Fax: [phone]

## 2024-03-01 NOTE — Patient Outreach (Addendum)
 Care Coordination   Follow Up Visit Note   03/01/2024 Name: Max Davenport MRN: [id number] DOB: 05-12-1946  Max Davenport is a 78 y.o. year old male who sees Max Davenport, Max Hazel, MD for primary care. I spoke with  Max Davenport by phone today.  What matters to the patients health and wellness today?  Not Sleeping Well -even with improvements in his  Hemoglobin (15.3 on 01/28/24) and weight. He is no longer prescribed iron pills.  [name] noted with some audible clearing of his throat, question sinus symptoms He has not reschedule with Max Davenport since 11/2023. Recommended a 6 week follow up. History of Community acquired pneumonia, upper respiratory infection, chronic sinusitis He reports he will schedule a follow up with pcp between 5/10 & 05/02/24 as recommended  He discusses some triggers for poor sleep may be related to  Stressed about being on time for appointments  Aches on right side during sleep.  History of s/p procedure for a deviated  septum Increased stuffiness in the evenings.  mold found in his home   Dust mites confirmed as an allergy trigger  No longer seeing an allergist ENT.  Has trouble sleeping in different environments.   He reports interventions he has already started to help with sleep and sinus symptoms include  purchase of a new adjustable bed with dust mite coverings on bed & pillows Removal of old molded furniture Takes medicines with side effects of fatigue/drowsiness- Mucinex, flonase, xyzal, meclizine, Toprol, Flomax as needed trazodone  ? Zetia with side effects of flu like symptoms  Patient and RN CM agreed to a plan of care to include   Changing the administration times of his diuretic to earlier in the day to prevent late night voiding, taking cholesterol medicine earlier in the evening *Life style & behavioral changes like setting multiple alarms, discontinue viewing television shows earlier *Going to bed earlier, a consistent schedule Make bedroom conducive,  free from distractions for sleep *Not napping during the day *Working with his pcp's pharmacist on reconciling his medication list, help obtain a night medicine ritual   Goals Addressed             This Visit's Progress    Patient will have improved sleep and respiratory symptoms RN CM care coordinator   On track    Patient will have a hemaglobin within normal limits - goal met Hemoglobin (15.3 on 01/28/24) and weight. He is no longer prescribed iron pills.- 03/01/24  Patient will maintain a within normal weight and report weight gains of 3-5 lbs 03/01/24 Patient will have improvement in sleep 4+ hours of sleep reported 03/01/24     Interventions Today    Flowsheet Row Most Recent Value  Chronic Disease   Chronic disease during today's visit Other  [poor sleep, medicines, lifestyle & medicine changes]  General Interventions   General Interventions Discussed/Reviewed General Interventions Reviewed, Communication with, Doctor Visits  Doctor Visits Discussed/Reviewed Doctor Visits Reviewed, Specialist, PCP  Max Davenport apointment with Cardiology & needed appointments with pcp/ ENT/pulmonology]  PCP/Specialist Visits Compliance with follow-up visit  Communication with PCP/Specialists, Pharmacists, RN  Exercise Interventions   Exercise Discussed/Reviewed Exercise Reviewed, Physical Activity, Weight Managment  Physical Activity Discussed/Reviewed Physical Activity Reviewed  Weight Management Weight maintenance  Education Interventions   Education Provided Provided Web-based Education, Provided Education  [insomina, medicines with side effect of fatigue/drowsiness, administration times of medicines, lifestyle changes to assist with sleep]  Provided Verbal Education On --  [fatigue]  Mental Health Interventions   Mental Health Discussed/Reviewed Mental Health Reviewed, Coping Strategies  Pharmacy Interventions   Pharmacy Dicussed/Reviewed Pharmacy Topics Reviewed, Medications and their  functions              SDOH assessments and interventions completed:  Yes  SDOH Interventions Today    Flowsheet Row Most Recent Value  SDOH Interventions   Food Insecurity Interventions Intervention Not Indicated  Housing Interventions Intervention Not Indicated  Transportation Interventions Intervention Not Indicated  Financial Strain Interventions Intervention Not Indicated  Stress Interventions Intervention Not Indicated  Social Connections Interventions Intervention Not Indicated  Health Literacy Interventions Intervention Not Indicated        Care Coordination Interventions:  Yes, provided   Follow up plan: Follow up call scheduled for 05/04/24    Encounter Outcome:  Patient Visit Completed   Cala Bradford L. Noelle Penner, RN, BSN, CCM [place]  [facility], [facility] RN Care Manager Direct Dial: [phone]  Fax: [phone]

## 2024-03-07 ENCOUNTER — Other Ambulatory Visit: Payer: Self-pay

## 2024-03-07 DIAGNOSIS — D62 Acute posthemorrhagic anemia: Secondary | ICD-10-CM

## 2024-03-21 ENCOUNTER — Telehealth: Payer: Self-pay

## 2024-03-21 NOTE — Progress Notes (Signed)
   03/21/2024  Patient ID: Max Davenport, male   DOB: October 08, 1946, 78 y.o.   MRN: [id number]   Patient appeared on insurance report for not passing the quality metrics in 2024:  Medication Adherence for Cholesterol (MAC) Medication Adherence for Diabetes (MAD)   Outreach to the patient was Successful.   Meds Tracking:  -Atorvastatin 40 mg - Last fill 100DS on 03/16/24, LDL 63 on 01/28/24. Does not qualify for metric yet, next fill due 06/24/24.  -Farxiga 10 mg - Last fill 90DS on 02/15/24, A1C 7.8 on 01/28/24. Does not qualify for metric yet, next PCP visit 05/11/24 and next fill due 05/15/24. -No longer on metformin  -Losartan 50 mg - Last fill 90DS on 10/25/24 however the directions were incorrect. He has been taking once daily but the prescription says twice daily. His supply should run out around 04/23/24. BP today was 126/69 at home so I recommend continuing with daily dosing.   Plan:  Up to date on all meds, chronic conditions fairly well controlled. Recommended continuing with current regimen and reassessing at next follow up with PCP in May. Since his losartan has incorrect directions I called [facility] to cancel his refill and coordinated with PCP to send in prescription with correct directions. If any dose changes are made in future visits I will do the same.   Fayette Pho, PharmD

## 2024-05-04 ENCOUNTER — Ambulatory Visit: Payer: Self-pay | Admitting: *Deleted

## 2024-05-04 DIAGNOSIS — E782 Mixed hyperlipidemia: Secondary | ICD-10-CM | POA: Diagnosis not present

## 2024-05-04 DIAGNOSIS — Z125 Encounter for screening for malignant neoplasm of prostate: Secondary | ICD-10-CM | POA: Diagnosis not present

## 2024-05-04 DIAGNOSIS — E1169 Type 2 diabetes mellitus with other specified complication: Secondary | ICD-10-CM | POA: Diagnosis not present

## 2024-05-04 NOTE — Patient Outreach (Signed)
 Complex Care Management   Visit Note  05/10/2024 updated entry for 05/04/24  Name:  Max Davenport MRN: [id number] DOB: Sep 11, 1946  Situation: Referral received for Complex Care Management related to Diabetes with Complications and GI bleed I obtained verbal consent from Patient.  Visit completed with Max Davenport  on the phone  Not taking 2 meds now melatonin  05/04/24 had pcp labs  Concern today Was given a bill for blood work/labs by Max Davenport  He had pcp office staff to bill him the charge of $71 but wants to understand if there was a change since 2024 with his lab coverage, co pays  He was informed by pcp staff that the charge was for all [facility] staff   Background:   Past Medical History:  Diagnosis Date   Atrial fibrillation (HCC)    Dysrhythmia    Essential hypertension    Hyperlipidemia    Skin cancer, basal cell    Type 2 diabetes mellitus (HCC)     Assessment: Patient Reported Symptoms:  Cognitive Cognitive Status: Alert and oriented to person, place, and time, Able to follow simple commands, Normal speech and language skills, Insightful and able to interpret abstract concepts Cognitive/Intellectual Conditions Management [RPT]: None reported or documented in medical history or problem list   Health Maintenance Behaviors: Annual physical exam, Exercise, Healthy diet, Immunizations, Hobbies, Sleep adequate, Social activities Healing Pattern: Average Health Facilitated by: Rest, Healthy diet  Neurological Neurological Review of Symptoms: No symptoms reported Neurological Management Strategies: Routine screening, Adequate rest Neurological Self-Management Outcome: 4 (good)  HEENT HEENT Symptoms Reported: Nasal discharge HEENT Management Strategies: Routine screening, Medication therapy, Fluid modification HEENT Self-Management Outcome: 3 (uncertain)    Cardiovascular Cardiovascular Symptoms Reported: No symptoms reported Does patient have uncontrolled Hypertension?:  No Cardiovascular Conditions: Dysrhythmia, Hypertension, High blood cholesterol Cardiovascular Management Strategies: Activity, Adequate rest, Diet modification, Exercise, Medical device, Medication therapy, Routine screening, Weight management Do You Have a Working Readable Scale?: Yes Cardiovascular Self-Management Outcome: 4 (good)  Respiratory Respiratory Symptoms Reported: Productive cough Respiratory Conditions: Seasonal allergies, Cough Respiratory Self-Management Outcome: 3 (uncertain) Respiratory Comment: hx pneumonia, coarse respiratory crackles, URIs  Endocrine Patient reports the following symptoms related to hypoglycemia or hyperglycemia : No symptoms reported Is patient diabetic?: Yes Is patient checking blood sugars at home?: Yes Endocrine Conditions: Diabetes Endocrine Management Strategies: Adequate rest, Activity, Exercise, Medication therapy, Routine screening, Weight management, Medical device, Diet modification, Fluid modification Endocrine Self-Management Outcome: 4 (good)  Gastrointestinal Gastrointestinal Symptoms Reported: Obesity Gastrointestinal Management Strategies: Adequate rest, Activity, Exercise Gastrointestinal Self-Management Outcome: 4 (good) Gastrointestinal Comment: reports he is gaining weight also reports he is doing more walking Nutrition Risk Screen (CP): No indicators present  Genitourinary Genitourinary Symptoms Reported: No symptoms reported Genitourinary Conditions: Chronic kidney disease Genitourinary Management Strategies: Activity, Adequate rest, Diet modification, Fluid modification, Exercise Genitourinary Self-Management Outcome: 4 (good)  Integumentary Integumentary Symptoms Reported: No symptoms reported Skin Management Strategies: Adequate rest, Routine screening Skin Self-Management Outcome: 4 (good)  Musculoskeletal Musculoskelatal Symptoms Reviewed: No symptoms reported Musculoskeletal Conditions: Mobility limited Musculoskeletal  Management Strategies: Activity, Adequate rest, Exercise, Medication therapy, Routine screening Musculoskeletal Self-Management Outcome: 4 (good) Falls in the past year?: No Number of falls in past year: 1 or less Was there an injury with Fall?: No Fall Risk Category Calculator: 0 Patient Fall Risk Level: Low Fall Risk Patient at Risk for Falls Due to: Impaired mobility Fall risk Follow up: Falls evaluation completed  Psychosocial Psychosocial Symptoms Reported: No symptoms reported Behavioral Management Strategies:  Activity, Adequate rest, Exercise, Support system, Medication therapy Behavioral Health Self-Management Outcome: 4 (good) Major Change/Loss/Stressor/Fears (CP): Medical condition, self Techniques to Cope with Loss/Stress/Change: Diversional activities, Medication, Exercise Quality of Family Relationships: helpful, supportive Do you feel physically threatened by others?: No      05/04/2024    4:06 PM  Depression screen PHQ 2/9  Decreased Interest 0  Down, Depressed, Hopeless 0  PHQ - 2 Score 0    There were no vitals filed for this visit.  Medications Reviewed Today   Medications were not reviewed in this encounter     Recommendation:   5/29/25PCP Follow-up  Follow Up Plan:   Telephone follow up appointment date/time:  05/10/24 1100  .Shanesha Bednarz L. Mcarthur Speedy, RN, BSN, CCM [place]  [facility], [facility] RN Care Manager Direct Dial: [phone]  Fax: [phone]

## 2024-05-10 ENCOUNTER — Other Ambulatory Visit: Payer: Self-pay | Admitting: *Deleted

## 2024-05-10 ENCOUNTER — Other Ambulatory Visit: Payer: Self-pay

## 2024-05-10 NOTE — Patient Instructions (Signed)
 Visit Information  Thank you for taking time to visit with me today. Please don't hesitate to contact me if I can be of assistance to you before our next scheduled appointment.  Your next care management appointment is by telephone on 05/31/24 at 1 pm  Please outreach if assistance is needed prior to your scheduled ap[pointment  Please call the care guide team at [phone] if you need to cancel, schedule, or reschedule an appointment.   Please call the Suicide and Crisis Lifeline: 988 call the [place]  National Suicide Prevention Lifeline: (606) 464-8549 or TTY: (562)520-1456 TTY 754 094 6883) to talk to a trained counselor call 1-800-273-TALK (toll free, 24 hour hotline) call the [facility]: [phone] call 911 if you are experiencing a Mental Health or Behavioral Health Crisis or need someone to talk to.  Max Bevill L. Mcarthur Speedy, RN, BSN, CCM [place]  [facility], [facility] RN Care Manager Direct Dial: [phone]  Fax: [phone]

## 2024-05-10 NOTE — Patient Outreach (Signed)
 RN CM call patient to reschedule later today related to assistance to a patient prior to outreach to him.   Max Reich L. Mcarthur Speedy, RN, BSN, CCM [place]  [facility], [facility] RN Care Manager Direct Dial: [phone]  Fax: [phone]

## 2024-05-11 DIAGNOSIS — E1122 Type 2 diabetes mellitus with diabetic chronic kidney disease: Secondary | ICD-10-CM | POA: Diagnosis not present

## 2024-05-11 DIAGNOSIS — E782 Mixed hyperlipidemia: Secondary | ICD-10-CM | POA: Diagnosis not present

## 2024-05-11 DIAGNOSIS — D751 Secondary polycythemia: Secondary | ICD-10-CM | POA: Diagnosis not present

## 2024-05-11 DIAGNOSIS — I4819 Other persistent atrial fibrillation: Secondary | ICD-10-CM | POA: Diagnosis not present

## 2024-05-11 DIAGNOSIS — R809 Proteinuria, unspecified: Secondary | ICD-10-CM | POA: Diagnosis not present

## 2024-05-11 DIAGNOSIS — N1832 Chronic kidney disease, stage 3b: Secondary | ICD-10-CM | POA: Diagnosis not present

## 2024-05-11 DIAGNOSIS — G4700 Insomnia, unspecified: Secondary | ICD-10-CM | POA: Diagnosis not present

## 2024-05-11 DIAGNOSIS — E6609 Other obesity due to excess calories: Secondary | ICD-10-CM | POA: Diagnosis not present

## 2024-05-11 DIAGNOSIS — R5383 Other fatigue: Secondary | ICD-10-CM | POA: Diagnosis not present

## 2024-05-11 DIAGNOSIS — E1169 Type 2 diabetes mellitus with other specified complication: Secondary | ICD-10-CM | POA: Diagnosis not present

## 2024-05-11 DIAGNOSIS — N400 Enlarged prostate without lower urinary tract symptoms: Secondary | ICD-10-CM | POA: Diagnosis not present

## 2024-05-11 DIAGNOSIS — I129 Hypertensive chronic kidney disease with stage 1 through stage 4 chronic kidney disease, or unspecified chronic kidney disease: Secondary | ICD-10-CM | POA: Diagnosis not present

## 2024-05-17 ENCOUNTER — Telehealth: Payer: Self-pay

## 2024-05-17 NOTE — Progress Notes (Signed)
   05/17/2024  Patient ID: Max Davenport, male   DOB: 1946/01/03, 78 y.o.   MRN: [id number]  Adherence Monitoring:  Up to date on meds, documentation in [employer].   Flint Hummer, PharmD

## 2024-05-18 ENCOUNTER — Encounter: Payer: Self-pay | Admitting: Cardiology

## 2024-05-18 ENCOUNTER — Ambulatory Visit: Payer: HMO | Attending: Cardiology | Admitting: Cardiology

## 2024-05-18 VITALS — BP 138/88 | HR 84 | Ht 69.0 in | Wt 207.0 lb

## 2024-05-18 DIAGNOSIS — E782 Mixed hyperlipidemia: Secondary | ICD-10-CM

## 2024-05-18 DIAGNOSIS — I10 Essential (primary) hypertension: Secondary | ICD-10-CM

## 2024-05-18 DIAGNOSIS — I4821 Permanent atrial fibrillation: Secondary | ICD-10-CM | POA: Diagnosis not present

## 2024-05-18 MED ORDER — METOPROLOL SUCCINATE ER 25 MG PO TB24: 25.0000 mg | ORAL_TABLET | Freq: Every day | ORAL | 3 refills | Status: DC

## 2024-05-18 NOTE — Patient Instructions (Addendum)
Medication Instructions:  Your physician has recommended you make the following change in your medication:  Increase metoprolol succinate to 25 mg daily Continue all other medications as prescribed  Labwork: none  Testing/Procedures: none  Follow-Up: Your physician recommends that you schedule a follow-up appointment in: 6 months  Any Other Special Instructions Will Be Listed Below (If Applicable).  If you need a refill on your cardiac medications before your next appointment, please call your pharmacy.

## 2024-05-18 NOTE — Progress Notes (Signed)
    Cardiology Office Note  Date: 05/18/2024   ID: RAMESES OU, DOB Mar 15, 1946, MRN [id number]  History of Present Illness: Max Davenport is a 78 y.o. male last seen in November 2024 by Ms. Clementine Cutting NP, I reviewed her note.  He is here today for a routine visit.  Still reports general lack of stamina, but feels better in general over the last 6 months.  He has been trying to exercise as tolerated, does not report any sense of palpitations or exertional chest pain.  We went over his medications.  We did discuss increasing his Toprol -XL to 25 mg daily, resting heart rate in the 80s to 90s in atrial fibrillation.  Antihypertensive regimen has been stable.  He does not report any spontaneous bleeding problems on Xarelto  and his most recent hemoglobin was 17.4.  Physical Exam: VS:  BP 138/88 (BP Location: Left Arm)   Pulse 84   Ht 5\' 9"  (1.753 m)   Wt 207 lb (93.9 kg)   SpO2 96%   BMI 30.57 kg/m , BMI Body mass index is 30.57 kg/m.  Wt Readings from Last 3 Encounters:  05/18/24 207 lb (93.9 kg)  02/02/24 205 lb (93 kg)  11/22/23 202 lb (91.6 kg)    General: Patient appears comfortable at rest. HEENT: Conjunctiva and lids normal. Neck: Supple, no elevated JVP or carotid bruits. Lungs: Clear to auscultation, nonlabored breathing at rest. Cardiac: Irregularly irregular, 1/6 systolic murmur, no gallop.  ECG:  An ECG dated 10/12/2023 was personally reviewed today and demonstrated:  Atrial fibrillation with decreased R wave progression.  Labwork: 10/12/2023: Magnesium 1.9 10/31/2023: ALT 26; AST 25 11/23/2023: BNP 147.3; BUN 20; Creatinine, Ser 1.70; Potassium 4.4; Sodium 144 12/14/2023: Hemoglobin 12.8; Platelets 214  May 2025: Hemoglobin 17.4, platelets 180, BUN 30, creatinine 1.86, potassium 4.3, GFR 37, AST 22, ALT 15, cholesterol 129, triglycerides 178, HDL 45, LDL 54  Other Studies Reviewed Today:  No interval cardiac testing for review today.  Assessment and Plan:  1.   Permanent atrial fibrillation with CHA2DS2-VASc score of 3.  Plan to increase Toprol -XL to 25 mg daily to try and attain somewhat better heart rate control, hopefully this will help with some of his exertional symptoms.  Continue Xarelto  15 mg daily for stroke prophylaxis.  2.  Exertional fatigue, no definite angina.  Lexiscan  Myoview  in October 2024 was low risk with no evidence of ischemia and LVEF 61%.   4.  Primary hypertension.  Continue Cozaar  50 mg daily.   5.  Mixed hyperlipidemia.  LDL 54 in May.  Continue Zetia  10 mg daily and Lipitor 20 mg daily.  6.  CKD stage IIIb, creatinine 1.86 with GFR 37 in May.  Disposition:  Follow up 6 months.  Signed, Gerard Knight, M.D., F.A.C.C. [facility]

## 2024-05-31 ENCOUNTER — Other Ambulatory Visit: Payer: Self-pay | Admitting: *Deleted

## 2024-05-31 NOTE — Patient Instructions (Addendum)
 Visit Information  Thank you for taking time to visit with me today. Please don't hesitate to contact me if I can be of assistance to you before our next scheduled appointment.  Your next care management appointment is by telephone on 07/12/24 at 1 pm  So Proud of the work your are doing!   Please call the care guide team at [phone] if you need to cancel, schedule, or reschedule an appointment.   Please call the Suicide and Crisis Lifeline: 988 call the [place]  National Suicide Prevention Lifeline: 734-146-2839 or TTY: 708-881-9066 TTY (564)595-0121) to talk to a trained counselor call 1-800-273-TALK (toll free, 24 hour hotline) call the [facility]: [phone] call 911 if you are experiencing a Mental Health or Behavioral Health Crisis or need someone to talk to.  Ericca Labra L. Mcarthur Speedy, RN, BSN, CCM [place]  [facility], [facility] Health RN Care Manager Direct Dial: [phone]  Fax: [phone]

## 2024-06-06 DIAGNOSIS — E1142 Type 2 diabetes mellitus with diabetic polyneuropathy: Secondary | ICD-10-CM | POA: Diagnosis not present

## 2024-06-06 DIAGNOSIS — L84 Corns and callosities: Secondary | ICD-10-CM | POA: Diagnosis not present

## 2024-06-06 DIAGNOSIS — B351 Tinea unguium: Secondary | ICD-10-CM | POA: Diagnosis not present

## 2024-06-07 ENCOUNTER — Other Ambulatory Visit: Payer: Self-pay | Admitting: Cardiology

## 2024-06-07 NOTE — Telephone Encounter (Signed)
 Prescription refill request for Xarelto  received.  Indication: AF Last office visit: 05/18/24  GORMAN Sierras MD Weight: 93.9kg Age: 78 Scr: 1.70 on 11/23/23  Epic CrCl: 48.33  Based on above findings Xarelto  15mg  daily is the appropriate dose.  Refill approved.

## 2024-06-15 ENCOUNTER — Telehealth: Admitting: *Deleted

## 2024-06-19 NOTE — Patient Instructions (Signed)
 Visit Information  Thank you for taking time to visit with me today. Please don't hesitate to contact me if I can be of assistance to you before our next scheduled appointment.  Your next care management appointment is by telephone on 05/31/24 at 1 pm    Please call the care guide team at [phone] if you need to cancel, schedule, or reschedule an appointment.   Please call the Suicide and Crisis Lifeline: 988 call the [place]  National Suicide Prevention Lifeline: (802) 727-7888 or TTY: 434 420 2840 TTY 740-810-7894) to talk to a trained counselor call 1-800-273-TALK (toll free, 24 hour hotline) call the [facility]: [phone] call 911 if you are experiencing a Mental Health or Behavioral Health Crisis or need someone to talk to.  Akio Hudnall L. Ramonita, RN, BSN, CCM [place]  [facility], [facility] Health RN Care Manager Direct Dial: [phone]  Fax: [phone]

## 2024-06-19 NOTE — Patient Outreach (Signed)
 Complex Care Management   Visit Note  06/19/2024 late entry for 05/10/24   Name:  Max Davenport MRN: [id number] DOB: [birth year]  Situation: Referral received for Complex Care Management related to Diabetes with Complications and GI Bleed I obtained verbal consent from Patient.  Visit completed with Max Davenport  on the phone  Background:   Past Medical History:  Diagnosis Date   Atrial fibrillation [facility])    Dysrhythmia    Essential hypertension    Hyperlipidemia    Skin cancer, basal cell    Type 2 diabetes mellitus (HCC)     Assessment: Patient Reported Symptoms:  Cognitive Cognitive Status: Alert and oriented to person, place, and time, Insightful and able to interpret abstract concepts, Normal speech and language skills Cognitive/Intellectual Conditions Management [RPT]: None reported or documented in medical history or problem list   Health Maintenance Behaviors: Annual physical exam, Exercise, Healthy diet, Immunizations, Sleep adequate, Hobbies, Social activities Healing Pattern: Average Health Facilitated by: Healthy diet, Rest  Neurological Neurological Review of Symptoms: No symptoms reported Neurological Self-Management Outcome: 4 (good)  HEENT HEENT Symptoms Reported: Nasal discharge HEENT Management Strategies: Adequate rest, Fluid modification, Routine screening HEENT Self-Management Outcome: 3 (uncertain)    Cardiovascular Cardiovascular Symptoms Reported: No symptoms reported Does patient have uncontrolled Hypertension?: No Cardiovascular Self-Management Outcome: 4 (good)  Respiratory Respiratory Symptoms Reported: Productive cough Respiratory Management Strategies: Adequate rest, Medication therapy, Routine screening Respiratory Self-Management Outcome: 3 (uncertain)  Endocrine Endocrine Symptoms Reported: No symptoms reported Is patient diabetic?: Yes Is patient checking blood sugars at home?: Yes List most recent blood sugar readings, include date and  time of day: 100s Endocrine Self-Management Outcome: 4 (good)  Gastrointestinal Gastrointestinal Symptoms Reported: No symptoms reported Gastrointestinal Management Strategies: Activity, Adequate rest, Exercise, Diet modification Gastrointestinal Self-Management Outcome: 4 (good) Nutrition Risk Screen (CP): No indicators present  Genitourinary Genitourinary Symptoms Reported: No symptoms reported Genitourinary Self-Management Outcome: 4 (good)  Integumentary Integumentary Symptoms Reported: No symptoms reported Skin Self-Management Outcome: 4 (good)  Musculoskeletal Musculoskelatal Symptoms Reviewed: No symptoms reported Musculoskeletal Self-Management Outcome: 4 (good)      Psychosocial Psychosocial Symptoms Reported: No symptoms reported Behavioral Management Strategies: Activity, Adequate rest, Exercise, Medication therapy, Support system Behavioral Health Self-Management Outcome: 4 (good) Major Change/Loss/Stressor/Fears (CP): Medical condition, self Techniques to Cope with Loss/Stress/Change: Diversional activities, Medication, Exercise Quality of Family Relationships: helpful, supportive Do you feel physically threatened by others?: No      05/04/2024    4:06 PM  Depression screen PHQ 2/9  Decreased Interest 0  Down, Depressed, Hopeless 0  PHQ - 2 Score 0    There were no vitals filed for this visit.  Medications Reviewed Today     Reviewed by Ramonita Suzen CROME, RN (Registered Nurse) on 05/10/24 at 1630  Med List Status: <None>   Medication Order Taking? Sig Documenting Provider Last Dose Status Informant  acetaminophen  (TYLENOL ) 500 MG tablet [id number]  Take 500 mg by mouth every 6 (six) hours as needed for mild pain (pain score 1-3). [provider]  Active Child  atorvastatin  (LIPITOR) 20 MG tablet [id number]  Take 20 mg by mouth at bedtime. [provider]  Active Child  dextromethorphan -guaiFENesin  (MUCINEX  DM) 30-600 MG 12hr tablet [id number]  Take 1  tablet by mouth 2 (two) times daily. Vann, Jessica U, DO  Active Child  ezetimibe  (ZETIA ) 10 MG tablet [id number]  TAKE 1 TABLET BY MOUTH AT BEDTIME FOR CHOLESTEROL. Debera Jayson MATSU, MD  Active   FARXIGA 10  MG TABS tablet [id number]  Take 10 mg by mouth daily. [provider]  Active Child  ferrous sulfate 325 (65 FE) MG tablet [id number]  Take 325 mg by mouth daily with breakfast.  Patient not taking: Reported on 03/01/2024   [provider]  Active   fluticasone  (FLONASE ) 50 MCG/ACT nasal spray [id number]  Place 1 spray into both nostrils daily. Ricky Fines, MD  Active   ipratropium-albuterol  (DUONEB) 0.5-2.5 (3) MG/3ML SOLN [id number]  Take 3 mLs by nebulization every 4 (four) hours as needed (SOB/cough).  Patient taking differently: Take 3 mLs by nebulization in the morning and at bedtime.   Vann, Jessica U, DO  Active Child  Lactobacillus (PROBIOTIC ACIDOPHILUS PO) [id number]  Take 1 capsule by mouth daily. [provider]  Active Child  levocetirizine (XYZAL ) 5 MG tablet [id number]  Take 5 mg by mouth at bedtime. [provider]  Active Child  losartan  (COZAAR ) 50 MG tablet [id number]  Take 1 tablet (50 mg total) by mouth daily. Hold if bp is low Ricky Fines, MD  Active   meclizine  (ANTIVERT ) 25 MG tablet [id number]  Take 25 mg by mouth 3 (three) times daily as needed for dizziness. [provider]  Active   metoprolol  succinate (TOPROL -XL) 25 MG 24 hr tablet [id number]  TAKE 1/2 TABLET BY MOUTH DAILY. Debera Jayson MATSU, MD  Active   Multiple Vitamin (MULTIVITAMIN) tablet [id number]  Take 1 tablet by mouth daily. [provider]  Active Child  pantoprazole  (PROTONIX ) 40 MG tablet [id number]  Take 1 tablet (40 mg total) by mouth daily before breakfast. Start after you complete your 3 months of twice daily dosing.  Patient not taking: Reported on 11/22/2023   Ezzard Sonny RAMAN, PA-C  Active   pantoprazole  (PROTONIX ) 40 MG tablet [id number]  Take 1  tablet (40 mg total) by mouth 2 (two) times daily. Ezzard Sonny RAMAN, PA-C  Active   Rivaroxaban  (XARELTO ) 15 MG TABS tablet [id number]  TAKE (1) TABLET BY MOUTH ONCE DAILY WITH SUPPER. Debera Jayson MATSU, MD  Active   tamsulosin  (FLOMAX ) 0.4 MG CAPS capsule [id number]  Take 0.4 mg by mouth daily after supper. [provider]  Active Child  traZODone  (DESYREL ) 50 MG tablet [id number]  Take 50 mg by mouth at bedtime. [provider]  Active Child            Recommendation:   PCP Follow-up Continue Current Plan of Care  Follow Up Plan:   Telephone follow up appointment date/time:  05/31/24   Suzen L. Ramonita, RN, BSN, CCM [place]  [facility], [facility] RN Care Manager Direct Dial: [phone]  Fax: [phone]

## 2024-06-19 NOTE — Patient Outreach (Signed)
 Complex Care Management   Visit Note  06/19/2024  Name:  Max Davenport MRN: [id number] DOB: 1946/01/22  Situation: Referral received for Complex Care Management related to Diabetes with Complications and GI bleed I obtained verbal consent from Patient.  Visit completed with Max Davenport  on the phone  Background:   Past Medical History:  Diagnosis Date   Atrial fibrillation [facility])    Dysrhythmia    Essential hypertension    Hyperlipidemia    Skin cancer, basal cell    Type 2 diabetes mellitus (HCC)     Assessment: Patient Reported Symptoms:  Cognitive Cognitive Status: No symptoms reported, Alert and oriented to person, place, and time, Insightful and able to interpret abstract concepts      Neurological Neurological Review of Symptoms: No symptoms reported Neurological Self-Management Outcome: 4 (good)  HEENT HEENT Symptoms Reported: No symptoms reported HEENT Self-Management Outcome: 4 (good)    Cardiovascular Cardiovascular Symptoms Reported: No symptoms reported Cardiovascular Self-Management Outcome: 4 (good)  Respiratory Respiratory Symptoms Reported: No symptoms reported Respiratory Self-Management Outcome: 4 (good)  Endocrine Endocrine Symptoms Reported: No symptoms reported Endocrine Self-Management Outcome: 4 (good)  Gastrointestinal Gastrointestinal Symptoms Reported: No symptoms reported Gastrointestinal Self-Management Outcome: 4 (good) Nutrition Risk Screen (CP): No indicators present  Genitourinary Genitourinary Symptoms Reported: No symptoms reported Genitourinary Self-Management Outcome: 4 (good)  Integumentary Integumentary Symptoms Reported: No symptoms reported Skin Self-Management Outcome: 4 (good)  Musculoskeletal Musculoskelatal Symptoms Reviewed: No symptoms reported Musculoskeletal Self-Management Outcome: 4 (good)      Psychosocial Psychosocial Symptoms Reported: No symptoms reported Behavioral Health Self-Management Outcome: 4 (good)    Quality of Family Relationships: helpful, supportive Do you feel physically threatened by others?: No      05/04/2024    4:06 PM  Depression screen PHQ 2/9  Decreased Interest 0  Down, Depressed, Hopeless 0  PHQ - 2 Score 0    There were no vitals filed for this visit.  Medications Reviewed Today   Medications were not reviewed in this encounter     Recommendation:   PCP Follow-up Continue Current Plan of Care  Follow Up Plan:   Telephone follow up appointment date/time:  07/12/24  Suzen L. Ramonita, RN, BSN, CCM [place]  [facility], [facility] RN Care Manager Direct Dial: [phone]  Fax: [phone]

## 2024-06-22 ENCOUNTER — Telehealth: Payer: Self-pay

## 2024-06-22 NOTE — Telephone Encounter (Signed)
 Overdue on meds, unsuccessful outreach, will try again next week

## 2024-06-28 ENCOUNTER — Telehealth: Payer: Self-pay

## 2024-06-28 NOTE — Telephone Encounter (Signed)
 Unsuccessful outreach, will try again next week

## 2024-07-03 ENCOUNTER — Other Ambulatory Visit: Payer: Self-pay | Admitting: Cardiology

## 2024-07-07 ENCOUNTER — Encounter (HOSPITAL_COMMUNITY): Payer: Self-pay | Admitting: Nephrology

## 2024-07-07 ENCOUNTER — Other Ambulatory Visit (HOSPITAL_COMMUNITY): Payer: Self-pay | Admitting: Nephrology

## 2024-07-07 DIAGNOSIS — E1122 Type 2 diabetes mellitus with diabetic chronic kidney disease: Secondary | ICD-10-CM | POA: Diagnosis not present

## 2024-07-07 DIAGNOSIS — N1832 Chronic kidney disease, stage 3b: Secondary | ICD-10-CM

## 2024-07-07 DIAGNOSIS — R809 Proteinuria, unspecified: Secondary | ICD-10-CM | POA: Diagnosis not present

## 2024-07-07 DIAGNOSIS — I129 Hypertensive chronic kidney disease with stage 1 through stage 4 chronic kidney disease, or unspecified chronic kidney disease: Secondary | ICD-10-CM | POA: Diagnosis not present

## 2024-07-12 ENCOUNTER — Other Ambulatory Visit: Payer: Self-pay | Admitting: *Deleted

## 2024-07-12 NOTE — Patient Instructions (Signed)
 Visit Information  Thank you for taking time to visit with me today. Please don't hesitate to contact me if I can be of assistance to you before our next scheduled appointment.  Your next care management appointment is by telephone on 08/17/24 at 1 pm  Please review your education materials and call with any questions  Please call the care guide team at [phone] if you need to cancel, schedule, or reschedule an appointment.   Please call the Suicide and Crisis Lifeline: 988 call the [place]  National Suicide Prevention Lifeline: (872) 288-5824 or TTY: 684 886 8455 TTY 914-297-6201) to talk to a trained counselor call 1-800-273-TALK (toll free, 24 hour hotline) call the [facility]: [phone] call 911 if you are experiencing a Mental Health or Behavioral Health Crisis or need someone to talk to.  Oakes Mccready L. Ramonita, RN, BSN, CCM [place]  [facility], [facility] RN Care Manager Direct Dial: [phone]  Fax: [phone]

## 2024-07-12 NOTE — Patient Outreach (Signed)
 Complex Care Management   Visit Note  07/13/2024  Name:  Max Davenport MRN: [id number] DOB: July 16, 1946  Situation: Referral received for Complex Care Management related to Diabetes with Complications and GI bleed I obtained verbal consent from Patient.  Visit completed with Max Davenport  on the phone  Lost weight ,now exercising more, but still reports a loss of stamina after hospitalization for GI bleed  Wt 200-201 lbs   Saw nephrology recently, next visit not until October for 24 hour urine sample.   CKD stage 3 B Discussed foods with high potassium  No access my chart Will speak with family member, Max Davenport for assistance and reach out to RN CCM if password needs resetting or deletion   Want to have hearing checked- audiology in network services per [facility] are in [place] or [place] vs [place] Checked bell tone in [place] (DME company he is interested in )- opened to visiting [employer] even if out of next work  [employer] exam completed recently at [employer] - he paid $500 out of pocket for his glasses  Worried about loss of independence. Don't like to dependent, pending Max Davenport license renewal - concerns about new laws related to 65+ persons that may make it difficult to renew   Background:   Past Medical History:  Diagnosis Date   Atrial fibrillation (HCC)    Dysrhythmia    Essential hypertension    Hyperlipidemia    Skin cancer, basal cell    Type 2 diabetes mellitus (HCC)     Assessment: Patient Reported Symptoms:  Cognitive Cognitive Status: Alert and oriented to person, place, and time, Normal speech and language skills, Other:, Insightful and able to interpret abstract concepts (reports minimal word finding) Cognitive/Intellectual Conditions Management [RPT]: None reported or documented in medical history or problem list   Health Maintenance Behaviors: Annual physical exam, Exercise, Healthy diet, Sleep adequate, Social activities  Neurological Neurological  Review of Symptoms: Vision changes, Hearing changes    HEENT HEENT Symptoms Reported: Change or loss of hearing, Sudden change or loss of vision HEENT Management Strategies: Routine screening, Medical device HEENT Self-Management Outcome: 3 (uncertain) HEENT Comment: staes his son has expressed that he is hard of hearing, He reports during his eye exam at [facility] the staff had to make multiple adjustments this year related to the change in vision    Cardiovascular Cardiovascular Symptoms Reported: Fatigue Does patient have uncontrolled Hypertension?: No Weight: 201 lb (91.2 kg)  Respiratory Respiratory Symptoms Reported: Other: Other Respiratory Symptoms: congeston before bed time mucinex , allergy medicine    Endocrine Endocrine Symptoms Reported: Weakness or fatigue Is patient diabetic?: Yes (just got new strips) Is patient checking blood sugars at home?: Yes List most recent blood sugar readings, include date and time of day: cbg =< 150- 200 Endocrine Self-Management Outcome: 4 (good)  Gastrointestinal Gastrointestinal Symptoms Reported: No symptoms reported Gastrointestinal Self-Management Outcome: 4 (good) Nutrition Risk Screen (CP): No indicators present  Genitourinary Genitourinary Symptoms Reported: No symptoms reported Genitourinary Management Strategies: Activity, Adequate rest, Diet modification, Medication therapy Genitourinary Self-Management Outcome: 4 (good)  Integumentary Integumentary Symptoms Reported: No symptoms reported Skin Management Strategies: Routine screening Skin Self-Management Outcome: 4 (good)  Musculoskeletal Musculoskelatal Symptoms Reviewed: Weakness Musculoskeletal Management Strategies: Activity, Adequate rest, Exercise, Medication therapy, Routine screening Musculoskeletal Self-Management Outcome: 4 (good) Falls in the past year?: No Number of falls in past year: 1 or less Was there an injury with Fall?: No Fall Risk Category Calculator: 0 Patient  Fall  Risk Level: Low Fall Risk Patient at Risk for Falls Due to: Impaired vision, Impaired mobility Fall risk Follow up: Falls evaluation completed  Psychosocial Psychosocial Symptoms Reported: Sadness - if selected complete PHQ 2-9 Additional Psychological Details: sad that he is interested in doing things but does not have the stamina to do all he wants to. Behavioral Management Strategies: Adequate rest, Support system, Coping strategies, Exercise Behavioral Health Self-Management Outcome: 4 (good) Major Change/Loss/Stressor/Fears (CP): Medical condition, self Techniques to Cope with Loss/Stress/Change: Diversional activities, Medication, Exercise Quality of Family Relationships: helpful, involved, supportive Do you feel physically threatened by others?: No      07/13/2024   10:06 AM  Depression screen PHQ 2/9  Decreased Interest 0  Down, Depressed, Hopeless 1  PHQ - 2 Score 1    There were no vitals filed for this visit.  Medications Reviewed Today   Medications were not reviewed in this encounter     Recommendation:   PCP Follow-up Specialty provider follow-up nephrology 09/2024 Continue Current Plan of Care Review all education mailed. Monitor loss of stamina, changes in vision and hearing, future referral as needed for audiology services  Follow Up Plan:   Telephone follow up appointment date/time:  08/17/24 1 pm  Sharayah Renfrow L. Ramonita, RN, BSN, CCM [place]  [facility], [facility] RN Care Manager Direct Dial: [phone]  Fax: [phone]

## 2024-07-14 ENCOUNTER — Ambulatory Visit (HOSPITAL_COMMUNITY)
Admission: RE | Admit: 2024-07-14 | Discharge: 2024-07-14 | Disposition: A | Discharge status: Home / Self Care | Source: Ambulatory Visit | Attending: Nephrology | Admitting: Nephrology

## 2024-07-14 DIAGNOSIS — N1832 Chronic kidney disease, stage 3b: Secondary | ICD-10-CM | POA: Insufficient documentation

## 2024-07-14 DIAGNOSIS — E1122 Type 2 diabetes mellitus with diabetic chronic kidney disease: Secondary | ICD-10-CM | POA: Insufficient documentation

## 2024-07-14 DIAGNOSIS — R809 Proteinuria, unspecified: Secondary | ICD-10-CM | POA: Diagnosis not present

## 2024-08-08 DIAGNOSIS — E1169 Type 2 diabetes mellitus with other specified complication: Secondary | ICD-10-CM | POA: Diagnosis not present

## 2024-08-08 DIAGNOSIS — E782 Mixed hyperlipidemia: Secondary | ICD-10-CM | POA: Diagnosis not present

## 2024-08-15 DIAGNOSIS — N1832 Chronic kidney disease, stage 3b: Secondary | ICD-10-CM | POA: Diagnosis not present

## 2024-08-15 DIAGNOSIS — E1169 Type 2 diabetes mellitus with other specified complication: Secondary | ICD-10-CM | POA: Diagnosis not present

## 2024-08-15 DIAGNOSIS — R809 Proteinuria, unspecified: Secondary | ICD-10-CM | POA: Diagnosis not present

## 2024-08-15 DIAGNOSIS — I4819 Other persistent atrial fibrillation: Secondary | ICD-10-CM | POA: Diagnosis not present

## 2024-08-15 DIAGNOSIS — E1142 Type 2 diabetes mellitus with diabetic polyneuropathy: Secondary | ICD-10-CM | POA: Diagnosis not present

## 2024-08-15 DIAGNOSIS — B351 Tinea unguium: Secondary | ICD-10-CM | POA: Diagnosis not present

## 2024-08-15 DIAGNOSIS — I10 Essential (primary) hypertension: Secondary | ICD-10-CM | POA: Diagnosis not present

## 2024-08-15 DIAGNOSIS — E782 Mixed hyperlipidemia: Secondary | ICD-10-CM | POA: Diagnosis not present

## 2024-08-15 DIAGNOSIS — L84 Corns and callosities: Secondary | ICD-10-CM | POA: Diagnosis not present

## 2024-08-15 DIAGNOSIS — E6609 Other obesity due to excess calories: Secondary | ICD-10-CM | POA: Diagnosis not present

## 2024-08-15 DIAGNOSIS — E1122 Type 2 diabetes mellitus with diabetic chronic kidney disease: Secondary | ICD-10-CM | POA: Diagnosis not present

## 2024-08-15 DIAGNOSIS — D751 Secondary polycythemia: Secondary | ICD-10-CM | POA: Diagnosis not present

## 2024-08-15 DIAGNOSIS — R399 Unspecified symptoms and signs involving the genitourinary system: Secondary | ICD-10-CM | POA: Diagnosis not present

## 2024-08-15 DIAGNOSIS — G4700 Insomnia, unspecified: Secondary | ICD-10-CM | POA: Diagnosis not present

## 2024-08-15 DIAGNOSIS — N400 Enlarged prostate without lower urinary tract symptoms: Secondary | ICD-10-CM | POA: Diagnosis not present

## 2024-08-17 ENCOUNTER — Other Ambulatory Visit: Payer: Self-pay | Admitting: *Deleted

## 2024-08-17 ENCOUNTER — Other Ambulatory Visit: Payer: Self-pay

## 2024-08-17 NOTE — Patient Outreach (Signed)
 Complex Care Management   Visit Note  09/20/2024 updated noted from 08/17/24  Name:  Max Davenport MRN: [id number] DOB: April 07, 1946  Situation: Referral received for Complex Care Management related to Diabetes with Complications and GI bleed I obtained verbal consent from Patient.  Visit completed with Parent  on the phone  Lost weight ,now exercising more, but still reports a loss of stamina after hospitalization for GI bleed  Wt 200-201 lbs     Saw nephrology recently, next visit not until October for 24 hour urine sample.    CKD stage 3 B Discussed foods with high potassium   No access my chart Will speak with family member, Max Davenport for assistance and reach out to RN CCM if password needs resetting or deletion    Want to have hearing checked- audiology in network services per [facility] are in [place] or [place] vs [place] Checked bell tone in [place] (DME company he is interested in )- opened to visiting [employer] even if out of next work   [employer] exam completed recently at [employer] - he paid $500 out of pocket for his glasses   Worried about loss of independence. Don't like to dependent, pending Max Davenport license renewal - concerns about new laws related to 65+ persons that may make it difficult to renew     Background:   Past Medical History:  Diagnosis Date   Atrial fibrillation (HCC)    Dysrhythmia    Essential hypertension    Hyperlipidemia    Skin cancer, basal cell    Type 2 diabetes mellitus (HCC)     Assessment: Patient Reported Symptoms:  Cognitive Cognitive Status: No symptoms reported, Alert and oriented to person, place, and time, Insightful and able to interpret abstract concepts, Normal speech and language skills      Neurological Neurological Review of Symptoms: Hearing changes, Vision changes Neurological Management Strategies: Routine screening Neurological Self-Management Outcome: 3 (uncertain)  HEENT HEENT Symptoms Reported: Change or loss of  hearing, Sudden change or loss of vision HEENT Management Strategies: Routine screening HEENT Self-Management Outcome: 3 (uncertain)    Cardiovascular Cardiovascular Symptoms Reported: Fatigue, Irregular pulse Does patient have uncontrolled Hypertension?: No Cardiovascular Management Strategies: Weight management, Routine screening, Medication therapy, Medical device, Fluid modification, Diet modification, Coping strategies, Adequate rest, Activity Cardiovascular Self-Management Outcome: 4 (good)  Respiratory Respiratory Symptoms Reported: Shortness of breath Respiratory Management Strategies: Adequate rest, Medication therapy, Routine screening Respiratory Self-Management Outcome: 4 (good)  Endocrine Endocrine Symptoms Reported: Weakness or fatigue, Shortness of breath Is patient diabetic?: Yes Is patient checking blood sugars at home?: Yes Endocrine Self-Management Outcome: 3 (uncertain)  Gastrointestinal Gastrointestinal Symptoms Reported: Constipation, Bleeding Gastrointestinal Management Strategies: Adequate rest, Medication therapy, Fluid modification Gastrointestinal Self-Management Outcome: 3 (uncertain)    Genitourinary Genitourinary Symptoms Reported: No symptoms reported Genitourinary Self-Management Outcome: 4 (good)  Integumentary Integumentary Symptoms Reported: No symptoms reported Skin Management Strategies: Routine screening Skin Self-Management Outcome: 4 (good)  Musculoskeletal Musculoskelatal Symptoms Reviewed: Weakness, Limited mobility Musculoskeletal Management Strategies: Adequate rest, Exercise, Routine screening, Weight management Musculoskeletal Self-Management Outcome: 3 (uncertain)   Patient at Risk for Falls Due to: Impaired mobility Fall risk Follow up: Falls evaluation completed  Psychosocial Psychosocial Symptoms Reported: Sadness - if selected complete PHQ 2-9 Behavioral Management Strategies: Support system, Adequate rest Behavioral Health  Self-Management Outcome: 3 (uncertain) Major Change/Loss/Stressor/Fears (CP): Resources, Medical condition, self Techniques to Cope with Loss/Stress/Change: Diversional activities Quality of Family Relationships: helpful, involved, supportive Do you feel physically threatened by others?: No  09/20/2024    PHQ2-9 Depression Screening   Little interest or pleasure in doing things Not at all  Feeling down, depressed, or hopeless Several days  PHQ-2 - Total Score 1  Trouble falling or staying asleep, or sleeping too much    Feeling tired or having little energy    Poor appetite or overeating     Feeling bad about yourself - or that you are a failure or have let yourself or your family down    Trouble concentrating on things, such as reading the newspaper or watching television    Moving or speaking so slowly that other people could have noticed.  Or the opposite - being so fidgety or restless that you have been moving around a lot more than usual    Thoughts that you would be better off dead, or hurting yourself in some way    PHQ2-9 Total Score    If you checked off any problems, how difficult have these problems made it for you to do your work, take care of things at home, or get along with other people    Depression Interventions/Treatment      There were no vitals filed for this visit.  Medications Reviewed Today   Medications were not reviewed in this encounter     Recommendation:   PCP Follow-up  Follow Up Plan:   Telephone follow up appointment date/time:  09/18/24 1 pm    Max Davenport L. Ramonita, RN, BSN, CCM [place]  [facility], [facility] RN Care Manager Direct Dial: [phone]  Fax: [phone]

## 2024-09-18 ENCOUNTER — Encounter: Payer: Self-pay | Admitting: *Deleted

## 2024-09-18 ENCOUNTER — Other Ambulatory Visit: Payer: Self-pay | Admitting: *Deleted

## 2024-09-18 ENCOUNTER — Other Ambulatory Visit: Payer: Self-pay

## 2024-09-18 NOTE — Patient Outreach (Signed)
 Complex Care Management   Visit Note  09/18/2024  Name:  Max Davenport MRN: [id number] DOB: 1946/05/14  Situation: Referral received for Complex Care Management related to Diabetes with Complications and and GI bleed I obtained verbal consent from Patient.  Visit completed with Patient  on the phone   Mr Loughney reports he is doing better with his meals and exercises but has concerns with remaining extremely weak and fatigue after walking   Exercises includes reaching up, walking (grocery store, to his mailbox) He confirms he recalls having physical rehab but not Cardiac rehab Had stress test in the past  Review of education sent by RN CM Today he has inquires about Foods to and not eat  Insurance will be changing for 2026    Voiced understanding of renal ultrasound completed on 07/14/24 & compared to his 02/23/24 ultrasound (US )  US  IMPRESSION: 1. Increased echogenicity of the kidneys as can be seen in medical renal disease. 2. Contour abnormality in the mid left kidney with focal bulging. This may represent a dromedary hump, but other renal lesions cannot be excluded. Recommend further evaluation with renal protocol CT or MRI. He voices the understanding of seeking further MRI for the noted Left kidney hump   Chronic Kidney disease (CKD) He voiced understanding of stage IIIB CKD (stage 3 B) a moderate to severe stage of kidney damage, where the kidneys are not functioning properly He is aware that he is to follow up with Nephrology, Dr Rachele on 10/13/24  after completing and taking a 24 hr urine specimen on 09/25/24    He voiced understanding of Benign Prostate hypertrophy  He confirms his leg swelling is better   Diabetes- Voiced understanding how to get his 30 day average from his  contour next one machine that will give him an idea of what his next HgA1c value may be Voiced understanding of cinnamon use  Weight now 197 lb- 200 lbs on Ozempic 0.25 mg samples- Has help with  decreasing his appetite Recent blood pressure (BP) values range from 122/53 -139/80    Background:   Past Medical History:  Diagnosis Date   Atrial fibrillation (HCC)    Dysrhythmia    Essential hypertension    Hyperlipidemia    Skin cancer, basal cell    Type 2 diabetes mellitus (HCC)     Assessment: Patient Reported Symptoms:  Cognitive Cognitive Status: No symptoms reported, Alert and oriented to person, place, and time, Normal speech and language skills, Insightful and able to interpret abstract concepts      Neurological Neurological Review of Symptoms: Hearing changes, Vision changes Neurological Management Strategies: Routine screening Neurological Self-Management Outcome: 3 (uncertain)  HEENT HEENT Symptoms Reported: Change or loss of hearing, Sudden change or loss of vision HEENT Management Strategies: Routine screening HEENT Self-Management Outcome: 3 (uncertain)    Cardiovascular Cardiovascular Symptoms Reported: Fatigue, Irregular pulse Does patient have uncontrolled Hypertension?: No Cardiovascular Management Strategies: Adequate rest, Activity, Diet modification, Exercise, Coping strategies, Fluid modification, Medical device, Medication therapy, Routine screening, Weight management Do You Have a Working Readable Scale?: Yes Weight: 200 lb (90.7 kg) Cardiovascular Self-Management Outcome: 4 (good)  Respiratory Respiratory Symptoms Reported: No symptoms reported Respiratory Self-Management Outcome: 4 (good)  Endocrine Endocrine Symptoms Reported: Weight loss, Weakness or fatigue Is patient diabetic?: Yes Is patient checking blood sugars at home?: Yes List most recent blood sugar readings, include date and time of day: cbg 150- 200s Endocrine Self-Management Outcome: 3 (uncertain)  Gastrointestinal Gastrointestinal Symptoms Reported: Change  in appetite Additional Gastrointestinal Details: change in appetite related to use of samples of Ozempic Gastrointestinal  Management Strategies: Adequate rest, Diet modification, Exercise, Fluid modification, Medication therapy Gastrointestinal Self-Management Outcome: 4 (good)    Genitourinary Genitourinary Symptoms Reported: No symptoms reported Genitourinary Management Strategies: Activity, Adequate rest, Diet modification, Exercise, Fluid modification, Medication therapy Genitourinary Self-Management Outcome: 4 (good)  Integumentary Integumentary Symptoms Reported: No symptoms reported Skin Management Strategies: Routine screening Skin Self-Management Outcome: 4 (good)  Musculoskeletal Musculoskelatal Symptoms Reviewed: Weakness Musculoskeletal Management Strategies: Activity, Adequate rest, Exercise, Medication therapy, Routine screening Musculoskeletal Self-Management Outcome: 4 (good) Falls in the past year?: No Number of falls in past year: 1 or less Was there an injury with Fall?: No Fall Risk Category Calculator: 0 Patient Fall Risk Level: Low Fall Risk Patient at Risk for Falls Due to: Impaired mobility Fall risk Follow up: Falls evaluation completed, Falls prevention discussed  Psychosocial Psychosocial Symptoms Reported: Sadness - if selected complete PHQ 2-9 Behavioral Management Strategies: Adequate rest, Support system Behavioral Health Self-Management Outcome: 3 (uncertain) Major Change/Loss/Stressor/Fears (CP): Resources, Medical condition, self Techniques to Cope with Loss/Stress/Change: Diversional activities, Exercise, Medication, Play Quality of Family Relationships: helpful, involved, supportive Do you feel physically threatened by others?: No    09/18/2024    PHQ2-9 Depression Screening   Little interest or pleasure in doing things Not at all  Feeling down, depressed, or hopeless Several days  PHQ-2 - Total Score 1  Trouble falling or staying asleep, or sleeping too much    Feeling tired or having little energy    Poor appetite or overeating     Feeling bad about yourself - or  that you are a failure or have let yourself or your family down    Trouble concentrating on things, such as reading the newspaper or watching television    Moving or speaking so slowly that other people could have noticed.  Or the opposite - being so fidgety or restless that you have been moving around a lot more than usual    Thoughts that you would be better off dead, or hurting yourself in some way    PHQ2-9 Total Score    If you checked off any problems, how difficult have these problems made it for you to do your work, take care of things at home, or get along with other people    Depression Interventions/Treatment      There were no vitals filed for this visit.  Medications Reviewed Today     Reviewed by Ramonita Suzen CROME, RN (Registered Nurse) on 09/18/24 at 1851  Med List Status: <None>   Medication Order Taking? Sig Documenting Provider Last Dose Status Informant  acetaminophen  (TYLENOL ) 500 MG tablet [id number] Yes Take 500 mg by mouth every 6 (six) hours as needed for mild pain (pain score 1-3). [provider]  Active Child  atorvastatin  (LIPITOR) 20 MG tablet [id number] Yes Take 20 mg by mouth at bedtime. [provider]  Active Child  dextromethorphan -guaiFENesin  (MUCINEX  DM) 30-600 MG 12hr tablet [id number]  Take 1 tablet by mouth 2 (two) times daily. Vann, Jessica U, DO  Active Child  ezetimibe  (ZETIA ) 10 MG tablet [id number] Yes TAKE 1 TABLET BY MOUTH AT BEDTIME FOR CHOLESTEROL. Debera Jayson MATSU, MD  Active   FARXIGA 10 MG TABS tablet [id number] Yes Take 10 mg by mouth daily. [provider]  Active Child  ferrous sulfate 325 (65 FE) MG tablet [id number] Yes Take 325 mg by mouth daily with breakfast. [provider]  Active   fluticasone  (FLONASE ) 50 MCG/ACT nasal spray [id number] Yes Place 1 spray into both nostrils daily. Ricky Fines, MD  Active   ipratropium-albuterol  (DUONEB) 0.5-2.5 (3) MG/3ML SOLN [id number]  Take 3 mLs by nebulization  every 4 (four) hours as needed (SOB/cough).  Patient taking differently: Take 3 mLs by nebulization in the morning and at bedtime.   Vann, Jessica U, DO  Active Child  Lactobacillus (PROBIOTIC ACIDOPHILUS PO) [id number] Yes Take 1 capsule by mouth daily. [provider]  Active Child  levocetirizine (XYZAL ) 5 MG tablet [id number] Yes Take 5 mg by mouth at bedtime. [provider]  Active Child  losartan  (COZAAR ) 50 MG tablet [id number] Yes Take 1 tablet (50 mg total) by mouth daily. Hold if bp is low Ricky Fines, MD  Active   meclizine  (ANTIVERT ) 25 MG tablet [id number] Yes Take 25 mg by mouth 3 (three) times daily as needed for dizziness. [provider]  Active   metoprolol  succinate (TOPROL -XL) 25 MG 24 hr tablet [id number] Yes Take 1 tablet (25 mg total) by mouth daily. Debera Jayson MATSU, MD  Active   metoprolol  succinate (TOPROL -XL) 25 MG 24 hr tablet [id number]  Take 25 mg by mouth daily.  Patient not taking: Reported on 06/19/2024   [provider]  Active   Multiple Vitamin (MULTIVITAMIN) tablet [id number] Yes Take 1 tablet by mouth daily. [provider]  Active Child  pantoprazole  (PROTONIX ) 40 MG tablet [id number] Yes Take 1 tablet (40 mg total) by mouth daily before breakfast. Start after you complete your 3 months of twice daily dosing. Ezzard Sonny RAMAN, PA-C  Active   tamsulosin  (FLOMAX ) 0.4 MG CAPS capsule [id number] Yes Take 0.4 mg by mouth daily after supper. [provider]  Active Child  tamsulosin  (FLOMAX ) 0.4 MG CAPS capsule [id number]  Take 0.4 mg by mouth daily.  Patient not taking: Reported on 06/19/2024   [provider]  Active   traZODone  (DESYREL ) 50 MG tablet [id number] Yes Take 50 mg by mouth at bedtime. [provider]  Active Child  XARELTO  15 MG TABS tablet [id number] Yes TAKE (1) TABLET BY MOUTH ONCE DAILY WITH SUPPER. Debera Jayson MATSU, MD  Active            Recommendation:   PCP  Follow-up Specialty provider follow-up Nephrology [facility] 10/13/24 Continue Current Plan of Care MD to concern an Evaluation/order for cardiac rehab program (monitored exercise) per patient interest MD consider order for a tailored nutrition referral per patient interest  Follow Up Plan:   Telephone follow up appointment date/time:  with Lynne Noris on 10/19/24 2 pm  Dotty Gonzalo L. Ramonita, RN, BSN, CCM [place]  [facility], [facility] RN Care Manager Direct Dial: [phone]  Fax: [phone]

## 2024-09-18 NOTE — Patient Instructions (Addendum)
 Visit Information  Thank you for taking time to visit with me today. Please don't hesitate to contact me if I can be of assistance to you before our next scheduled appointment.  Your next care management appointment is by telephone on 10/19/24 at 2 pm with Max Davenport [phone]   Mr Hoard here are a few extra resources for diet/nutrition -https://diabetes.org/tools-support/ask-the-experts#events -Group diabetes class [facility] call [phone] to register- [address] [place] [facility]  -Kidney smart by [employer]  -Ask the experts by american Diabetes association [phone] -Living healthy at home by [facility] (415)398-2687 -KidneyCare:365 by [facility] [email or web address]-disease-[email or web address] -Diabetes Management by [facility]  [phone]  Please call the care guide team at [phone] if you need to cancel, schedule, or reschedule an appointment.   Please call the Suicide and Crisis Lifeline: 988 call the [place]  National Suicide Prevention Lifeline: 321-322-9319 or TTY: (843) 706-2960 TTY 651 033 7701) to talk to a trained counselor call 1-800-273-TALK (toll free, 24 hour hotline) call the [facility]: (401) 870-2632 call 911 if you are experiencing a Mental Health or Behavioral Health Crisis or need someone to talk to.  Jabes Primo L. Ramonita, RN, BSN, CCM [place]  [facility], [facility] RN Care Manager Direct Dial: [phone]  Fax: [phone]

## 2024-09-20 NOTE — Patient Instructions (Signed)
 Visit Information  Thank you for taking time to visit with me today. Please don't hesitate to contact me if I can be of assistance to you before our next scheduled appointment.  Your next care management appointment is by telephone on 10/19/24 at 1 pm    Please call the care guide team at [phone] if you need to cancel, schedule, or reschedule an appointment.   Please call the Suicide and Crisis Lifeline: 988 call the [place]  National Suicide Prevention Lifeline: 2100064658 or TTY: 907-383-4083 TTY (947)672-1628) to talk to a trained counselor call 1-800-273-TALK (toll free, 24 hour hotline) call the [facility]: 604-674-8769 call 911 if you are experiencing a Mental Health or Behavioral Health Crisis or need someone to talk to.  Devyne Hauger L. Ramonita, RN, BSN, CCM [place]  [facility], [facility] RN Care Manager Direct Dial: [phone]  Fax: [phone]

## 2024-09-25 DIAGNOSIS — N1832 Chronic kidney disease, stage 3b: Secondary | ICD-10-CM | POA: Diagnosis not present

## 2024-09-25 DIAGNOSIS — D509 Iron deficiency anemia, unspecified: Secondary | ICD-10-CM | POA: Diagnosis not present

## 2024-09-25 DIAGNOSIS — R809 Proteinuria, unspecified: Secondary | ICD-10-CM | POA: Diagnosis not present

## 2024-09-25 DIAGNOSIS — I129 Hypertensive chronic kidney disease with stage 1 through stage 4 chronic kidney disease, or unspecified chronic kidney disease: Secondary | ICD-10-CM | POA: Diagnosis not present

## 2024-09-25 DIAGNOSIS — E1129 Type 2 diabetes mellitus with other diabetic kidney complication: Secondary | ICD-10-CM | POA: Diagnosis not present

## 2024-09-25 DIAGNOSIS — E1122 Type 2 diabetes mellitus with diabetic chronic kidney disease: Secondary | ICD-10-CM | POA: Diagnosis not present

## 2024-09-27 ENCOUNTER — Encounter (INDEPENDENT_AMBULATORY_CARE_PROVIDER_SITE_OTHER): Payer: Self-pay | Admitting: Gastroenterology

## 2024-10-13 DIAGNOSIS — N1832 Chronic kidney disease, stage 3b: Secondary | ICD-10-CM | POA: Diagnosis not present

## 2024-10-13 DIAGNOSIS — R93422 Abnormal radiologic findings on diagnostic imaging of left kidney: Secondary | ICD-10-CM | POA: Diagnosis not present

## 2024-10-13 DIAGNOSIS — E1122 Type 2 diabetes mellitus with diabetic chronic kidney disease: Secondary | ICD-10-CM | POA: Diagnosis not present

## 2024-10-13 DIAGNOSIS — I129 Hypertensive chronic kidney disease with stage 1 through stage 4 chronic kidney disease, or unspecified chronic kidney disease: Secondary | ICD-10-CM | POA: Diagnosis not present

## 2024-10-19 ENCOUNTER — Other Ambulatory Visit: Payer: Self-pay

## 2024-10-19 NOTE — Patient Instructions (Signed)
 Elsie HERO Benyo - I am sorry I was unable to reach you today for our scheduled appointment. I work with No primary care provider on file. and am calling to support your healthcare needs. Please contact me at [phone] your earliest convenience. I look forward to speaking with you soon.   Thank you,  Hendricks Her RN, BSN  [place] [facility] RN Case Manager   Direct [phone]

## 2024-10-24 DIAGNOSIS — L84 Corns and callosities: Secondary | ICD-10-CM | POA: Diagnosis not present

## 2024-10-24 DIAGNOSIS — E1142 Type 2 diabetes mellitus with diabetic polyneuropathy: Secondary | ICD-10-CM | POA: Diagnosis not present

## 2024-10-24 DIAGNOSIS — B351 Tinea unguium: Secondary | ICD-10-CM | POA: Diagnosis not present

## 2024-11-06 ENCOUNTER — Inpatient Hospital Stay: Attending: Hematology and Oncology | Admitting: Hematology and Oncology

## 2024-11-06 ENCOUNTER — Inpatient Hospital Stay

## 2024-11-06 VITALS — BP 135/82 | HR 69 | Temp 98.3°F | Resp 18 | Ht 69.5 in | Wt 206.0 lb

## 2024-11-06 DIAGNOSIS — Z7901 Long term (current) use of anticoagulants: Secondary | ICD-10-CM | POA: Diagnosis not present

## 2024-11-06 DIAGNOSIS — Z7984 Long term (current) use of oral hypoglycemic drugs: Secondary | ICD-10-CM | POA: Insufficient documentation

## 2024-11-06 DIAGNOSIS — D500 Iron deficiency anemia secondary to blood loss (chronic): Secondary | ICD-10-CM | POA: Insufficient documentation

## 2024-11-06 DIAGNOSIS — E785 Hyperlipidemia, unspecified: Secondary | ICD-10-CM | POA: Insufficient documentation

## 2024-11-06 DIAGNOSIS — D472 Monoclonal gammopathy: Secondary | ICD-10-CM | POA: Insufficient documentation

## 2024-11-06 DIAGNOSIS — Z79899 Other long term (current) drug therapy: Secondary | ICD-10-CM | POA: Insufficient documentation

## 2024-11-06 DIAGNOSIS — I4891 Unspecified atrial fibrillation: Secondary | ICD-10-CM | POA: Insufficient documentation

## 2024-11-06 DIAGNOSIS — E119 Type 2 diabetes mellitus without complications: Secondary | ICD-10-CM | POA: Diagnosis not present

## 2024-11-06 DIAGNOSIS — Z85828 Personal history of other malignant neoplasm of skin: Secondary | ICD-10-CM | POA: Insufficient documentation

## 2024-11-06 DIAGNOSIS — I10 Essential (primary) hypertension: Secondary | ICD-10-CM | POA: Diagnosis not present

## 2024-11-06 LAB — CBC WITH DIFFERENTIAL/PLATELET
Abs Immature Granulocytes: 0.04 K/uL (ref 0.00–0.07)
Basophils Absolute: 0 K/uL (ref 0.0–0.1)
Basophils Relative: 0 %
Eosinophils Absolute: 0 K/uL (ref 0.0–0.5)
Eosinophils Relative: 0 %
HCT: 53.6 % — ABNORMAL HIGH (ref 39.0–52.0)
Hemoglobin: 17.7 g/dL — ABNORMAL HIGH (ref 13.0–17.0)
Immature Granulocytes: 0 %
Lymphocytes Relative: 15 %
Lymphs Abs: 1.4 K/uL (ref 0.7–4.0)
MCH: 30.8 pg (ref 26.0–34.0)
MCHC: 33 g/dL (ref 30.0–36.0)
MCV: 93.4 fL (ref 80.0–100.0)
Monocytes Absolute: 0.7 K/uL (ref 0.1–1.0)
Monocytes Relative: 7 %
Neutro Abs: 7 K/uL (ref 1.7–7.7)
Neutrophils Relative %: 78 %
Platelets: 210 K/uL (ref 150–400)
RBC: 5.74 MIL/uL (ref 4.22–5.81)
RDW: 12.8 % (ref 11.5–15.5)
WBC: 9.1 K/uL (ref 4.0–10.5)
nRBC: 0 % (ref 0.0–0.2)

## 2024-11-06 LAB — COMPREHENSIVE METABOLIC PANEL WITH GFR
ALT: 17 U/L (ref 0–44)
AST: 19 U/L (ref 15–41)
Albumin: 4.6 g/dL (ref 3.5–5.0)
Alkaline Phosphatase: 92 U/L (ref 38–126)
Anion gap: 13 (ref 5–15)
BUN: 36 mg/dL — ABNORMAL HIGH (ref 8–23)
CO2: 26 mmol/L (ref 22–32)
Calcium: 9.7 mg/dL (ref 8.9–10.3)
Chloride: 103 mmol/L (ref 98–111)
Creatinine, Ser: 1.96 mg/dL — ABNORMAL HIGH (ref 0.61–1.24)
GFR, Estimated: 34 mL/min — ABNORMAL LOW (ref >60)
Glucose, Bld: 203 mg/dL — ABNORMAL HIGH (ref 70–99)
Potassium: 5.2 mmol/L — ABNORMAL HIGH (ref 3.5–5.1)
Sodium: 141 mmol/L (ref 135–145)
Total Bilirubin: 0.8 mg/dL (ref 0.0–1.2)
Total Protein: 7.5 g/dL (ref 6.5–8.1)

## 2024-11-06 NOTE — Progress Notes (Signed)
  [facility] CONSULT NOTE  Patient Care Team: Shona Norleen PEDLAR, MD as PCP - General (Internal Medicine) Debera Jayson MATSU, MD as PCP - Cardiology (Cardiology) Joeann Browning, FNP (Nurse Practitioner) Darlean Ozell NOVAK, MD as Consulting Physician (Pulmonary Disease) Joeann Browning, FNP (Nurse Practitioner) Rachele Gaynell RAMAN, MD as Referring Physician (Nephrology) Kay Hendricks MATSU, RN as VBCI Care Management  CHIEF COMPLAINTS/PURPOSE OF CONSULTATION:  MGUS  ASSESSMENT & PLAN:  No problem-specific Assessment & Plan notes found for this encounter.  Orders Placed This Encounter  Procedures   CBC with Differential/Platelet    Standing Status:   Standing    Number of Occurrences:   22    Expiration Date:   11/06/2025   Comprehensive metabolic panel with GFR    Standing Status:   Standing    Number of Occurrences:   33    Expiration Date:   11/06/2025   SPEP with reflex to IFE    Standing Status:   Future    Number of Occurrences:   1    Expected Date:   11/06/2024    Expiration Date:   11/06/2025   Kappa/lambda light chains    Standing Status:   Future    Number of Occurrences:   1    Expected Date:   11/06/2024    Expiration Date:   11/06/2025   UPEP/TP, 24-Hr Urine    Standing Status:   Future    Expected Date:   11/06/2024    Expiration Date:   11/06/2025   IgG, IgA, IgM    Standing Status:   Future    Number of Occurrences:   1    Expected Date:   11/06/2024    Expiration Date:   11/06/2025   Beta 2 microglobulin    Standing Status:   Future    Number of Occurrences:   1    Expected Date:   11/06/2024    Expiration Date:   11/06/2025   Assessment and Plan Assessment & Plan  This is a very pleasant 78 yr old male patient with no new complaints referred to hematology for evaluation of possible MGUS. He saw his nephrologist recently and was thought to have an M protein in his urine.  Monoclonal gammopathy of undetermined significance is a clinically  asymptomatic premalignant clonal plasma cell or lymphoplasmacytic proliferative disorder defined by presence of monoclonal protein at a concentration less than 3 g/dL, bone marrow with less than 10% monoclonal plasma cells and absence of endorgan damage.  It occurs in over 3% of the white population over the age of 83 and typically detected as an incidental finding when patients undergo protein electrophoresis for evaluation of other clinical symptoms and disorders.  We categorize MGUS and do not IgM MGUS, IgM MGUS and light chain MGUS.  If MGUS is indeed confirmed, we risk stratify MGUS based on serum M protein level, non-IgG MGUS and abnormal serum free light chain ratio.  Depending on the risk factor presents, the risk of transformation to myeloma can vary.  Patients with low risk MGUS have a risk of progression of 45% over 20 years and may be followed with history and physical exam alone. All of the patients are followed up with annual SPEP, free light chain assay, complete blood count, complete metabolic panel.    In this patient, we will proceed with additional evaluation.  Monoclonal gammopathy of undetermined significance (MGUS) Abnormal lab results suggest possible MGUS. Further investigation required to confirm diagnosis. - Ordered  repeat blood work to evaluate for MGUS. - Instructed to collect 24-hour urine sample for protein evaluation. - Scheduled follow-up appointment to review lab results.    HISTORY OF PRESENTING ILLNESS:  Max Davenport 78 y.o. male is here because of MGUS  Discussed the use of AI scribe software for clinical note transcription with the patient, who gave verbal consent to proceed.  History of Present Illness Max Davenport is a 78 year old male who presents for evaluation of abnormal lab results related to kidney function. He is accompanied by Fae, his daughter-in-law. He was referred by his kidney doctor for further evaluation of abnormal lab results.  He  experiences some fatigue. No new symptoms such as weight loss, shortness of breath, chest pain, or hematuria.   He has a history of blood loss anemia in November 2024, which was investigated with a capsule endoscopy and colonoscopy. Three polyps were removed, but the source of bleeding was not identified. His hemoglobin levels have since normalized.  He has type 2 diabetes and is on medication for it, along with medications for hypertension and hyperlipidemia. He takes Xarelto  for atrial fibrillation.  He is retired and previously worked for the [employer]. He does not smoke and has never smoked. He used to enjoy archery but has reduced physical activity due to fatigue, especially after having COVID pneumonia.  All other systems were reviewed with the patient and are negative.  MEDICAL HISTORY:  Past Medical History:  Diagnosis Date   Atrial fibrillation [facility])    Dysrhythmia    Essential hypertension    Hyperlipidemia    Skin cancer, basal cell    Type 2 diabetes mellitus (HCC)     SURGICAL HISTORY: Past Surgical History:  Procedure Laterality Date   BASAL CELL CARCINOMA EXCISION     Top of head   BIOPSY  11/02/2023   Procedure: BIOPSY;  Surgeon: Eartha Angelia Sieving, MD;  [facility];  Service: Gastroenterology;;   COLONOSCOPY WITH PROPOFOL  N/A 11/02/2023   Procedure: COLONOSCOPY WITH PROPOFOL ;  Surgeon: Eartha Angelia Sieving, MD;  [facility];  Service: Gastroenterology;  Laterality: N/A;   ESOPHAGOGASTRODUODENOSCOPY (EGD) WITH PROPOFOL  N/A 11/02/2023   Procedure: ESOPHAGOGASTRODUODENOSCOPY (EGD) WITH PROPOFOL ;  Surgeon: Eartha Angelia Sieving, MD;  [facility];  Service: Gastroenterology;  Laterality: N/A;   GIVENS CAPSULE STUDY N/A 11/03/2023   Procedure: GIVENS CAPSULE STUDY;  Surgeon: Shaaron Lamar HERO, MD;  [facility]: [facility];  Service: Endoscopy;  Laterality: N/A;   INGUINAL HERNIA REPAIR Right    LUMBAR  FUSION  2008   NASAL RECONSTRUCTION     WISDOM TOOTH EXTRACTION      SOCIAL HISTORY: Social History   Socioeconomic History   Marital status: Single    Spouse name: Not on file   Number of children: Not on file   Years of education: Not on file   Highest education level: Not on file  Occupational History   Not on file  Tobacco Use   Smoking status: Never   Smokeless tobacco: Never  Vaping Use   Vaping status: Never Used  Substance and Sexual Activity   Alcohol use: Never   Drug use: Never   Sexual activity: Not on file  Other Topics Concern   Not on file  Social History Narrative   Not on file   Social Drivers of Health   Financial Resource Strain: Low Risk  (09/18/2024)   Overall Financial Resource Strain (CARDIA)  Difficulty of Paying Living Expenses: Not hard at all  Food Insecurity: No Food Insecurity (09/18/2024)   Hunger Vital Sign    Worried About Running Out of Food in the Last Year: Never true    Ran Out of Food in the Last Year: Never true  Transportation Needs: No Transportation Needs (09/18/2024)   PRAPARE - Administrator, Civil Service (Medical): No    Lack of Transportation (Non-Medical): No  Physical Activity: Sufficiently Active (09/20/2024)   Exercise Vital Sign    Days of Exercise per Week: 7 days    Minutes of Exercise per Session: 30 min  Stress: No Stress Concern Present (03/01/2024)   [employer] of Occupational Health - Occupational Stress Questionnaire    Feeling of Stress : Only a little  Social Connections: Moderately Isolated (09/18/2024)   Social Connection and Isolation Panel    Frequency of Communication with Friends and Family: Twice a week    Frequency of Social Gatherings with Friends and Family: Twice a week    Attends Religious Services: 1 to 4 times per year    Active Member of [employer] or Organizations: No    Attends Banker Meetings: Patient declined    Marital Status: Never married  Intimate  Partner Violence: Not At Risk (09/18/2024)   Humiliation, Afraid, Rape, and Kick questionnaire    Fear of Current or Ex-Partner: No    Emotionally Abused: No    Physically Abused: No    Sexually Abused: No    FAMILY HISTORY: Family History  Adopted: Yes    ALLERGIES:  is allergic to ativan  [lorazepam ].  MEDICATIONS:  Current Outpatient Medications  Medication Sig Dispense Refill   acetaminophen  (TYLENOL ) 500 MG tablet Take 500 mg by mouth every 6 (six) hours as needed for mild pain (pain score 1-3).     atorvastatin  (LIPITOR) 40 MG tablet Take 1 tablet(s) every day by oral route in the evening.     dextromethorphan -guaiFENesin  (MUCINEX  DM) 30-600 MG 12hr tablet Take 1 tablet by mouth 2 (two) times daily.     ezetimibe  (ZETIA ) 10 MG tablet TAKE 1 TABLET BY MOUTH AT BEDTIME FOR CHOLESTEROL. 90 tablet 3   FARXIGA 10 MG TABS tablet Take 10 mg by mouth daily.     Lactobacillus (PROBIOTIC ACIDOPHILUS PO) Take 1 capsule by mouth daily.     levocetirizine (XYZAL ) 5 MG tablet Take 5 mg by mouth at bedtime.     losartan  (COZAAR ) 50 MG tablet Take 1 tablet (50 mg total) by mouth daily. Hold if bp is low     meclizine  (ANTIVERT ) 25 MG tablet Take 25 mg by mouth 3 (three) times daily as needed for dizziness.     metoprolol  succinate (TOPROL -XL) 25 MG 24 hr tablet Take 25 mg by mouth daily.     Multiple Vitamin (MULTIVITAMIN) tablet Take 1 tablet by mouth daily.     pantoprazole  (PROTONIX ) 40 MG tablet Take 1 tablet (40 mg total) by mouth daily before breakfast. Start after you complete your 3 months of twice daily dosing. 90 tablet 3   tamsulosin  (FLOMAX ) 0.4 MG CAPS capsule Take 0.4 mg by mouth daily.     XARELTO  15 MG TABS tablet TAKE (1) TABLET BY MOUTH ONCE DAILY WITH SUPPER. 30 tablet 5   No current facility-administered medications for this visit.     PHYSICAL EXAMINATION: ECOG PERFORMANCE STATUS: 0 - Asymptomatic  Vitals:   11/06/24 1131 11/06/24 1139  BP: (!) 155/75 135/82  Pulse: 69   Resp: 18   Temp: 98.3 F (36.8 C)   SpO2: 98%    Filed Weights   11/06/24 1131  Weight: 206 lb (93.4 kg)    GENERAL:alert, no distress and comfortable SKIN: skin color, texture, turgor are normal, no rashes or significant lesions EYES: normal, conjunctiva are pink and non-injected, sclera clear OROPHARYNX:no exudate, no erythema and lips, buccal mucosa, and tongue normal  NECK: supple, thyroid  normal size, non-tender, without nodularity LYMPH:  no palpable lymphadenopathy in the cervical, axillary  LUNGS: clear to auscultation and percussion with normal breathing effort HEART: regular rate & rhythm and no murmurs and no lower extremity edema ABDOMEN:abdomen soft, non-tender and normal bowel sounds Musculoskeletal:no cyanosis of digits and no clubbing  PSYCH: alert & oriented x 3 with fluent speech NEURO: no focal motor/sensory deficits  LABORATORY DATA:  I have reviewed the data as listed Lab Results  Component Value Date   WBC 5.5 12/14/2023   HGB 12.8 (L) 12/14/2023   HCT 40.9 12/14/2023   MCV 92.7 12/14/2023   PLT 214 12/14/2023     Chemistry      Component Value Date/Time   NA 144 11/23/2023 1004   K 4.4 11/23/2023 1004   CL 106 11/23/2023 1004   CO2 21 11/23/2023 1004   BUN 20 11/23/2023 1004   CREATININE 1.70 (H) 11/23/2023 1004   CREATININE 1.48 (H) 04/07/2021 0943      Component Value Date/Time   CALCIUM  9.1 11/23/2023 1004   ALKPHOS 60 10/31/2023 0951   AST 25 10/31/2023 0951   ALT 26 10/31/2023 0951   BILITOT 0.4 10/31/2023 0951     No anemia noted CKD noted SPEP with no M protein UPEP with some M protein reported I didn't see any additional Myeloma related labs.  RADIOGRAPHIC STUDIES: I have personally reviewed the radiological images as listed and agreed with the findings in the report. No results found.  All questions were answered. The patient knows to call the clinic with any problems, questions or concerns. I spent 45  minutes in the care of this patient including H and P, review of records, counseling and coordination of care.     Amber Stalls, MD 11/06/2024 12:00 PM

## 2024-11-07 ENCOUNTER — Other Ambulatory Visit: Payer: Self-pay

## 2024-11-07 DIAGNOSIS — N1832 Chronic kidney disease, stage 3b: Secondary | ICD-10-CM | POA: Diagnosis not present

## 2024-11-07 DIAGNOSIS — E1169 Type 2 diabetes mellitus with other specified complication: Secondary | ICD-10-CM | POA: Diagnosis not present

## 2024-11-07 DIAGNOSIS — D472 Monoclonal gammopathy: Secondary | ICD-10-CM | POA: Diagnosis not present

## 2024-11-07 DIAGNOSIS — E782 Mixed hyperlipidemia: Secondary | ICD-10-CM | POA: Diagnosis not present

## 2024-11-07 DIAGNOSIS — R809 Proteinuria, unspecified: Secondary | ICD-10-CM | POA: Diagnosis not present

## 2024-11-07 LAB — KAPPA/LAMBDA LIGHT CHAINS
Kappa free light chain: 26.8 mg/L — ABNORMAL HIGH (ref 3.3–19.4)
Kappa, lambda light chain ratio: 0.72 (ref 0.26–1.65)
Lambda free light chains: 37.4 mg/L — ABNORMAL HIGH (ref 5.7–26.3)

## 2024-11-07 LAB — IGG, IGA, IGM
IgA: 390 mg/dL (ref 61–437)
IgG (Immunoglobin G), Serum: 1032 mg/dL (ref 603–1613)
IgM (Immunoglobulin M), Srm: 65 mg/dL (ref 15–143)

## 2024-11-07 LAB — BETA 2 MICROGLOBULIN, SERUM: Beta-2 Microglobulin: 2.4 mg/L (ref 0.6–2.4)

## 2024-11-08 LAB — PROTEIN ELECTROPHORESIS, SERUM, WITH REFLEX
A/G Ratio: 1.1 (ref 0.7–1.7)
Albumin ELP: 3.9 g/dL (ref 2.9–4.4)
Alpha-1-Globulin: 0.2 g/dL (ref 0.0–0.4)
Alpha-2-Globulin: 0.9 g/dL (ref 0.4–1.0)
Beta Globulin: 1.5 g/dL — ABNORMAL HIGH (ref 0.7–1.3)
Gamma Globulin: 1 g/dL (ref 0.4–1.8)
Globulin, Total: 3.5 g/dL (ref 2.2–3.9)
Total Protein ELP: 7.4 g/dL (ref 6.0–8.5)

## 2024-11-10 ENCOUNTER — Ambulatory Visit: Payer: Self-pay | Admitting: Hematology and Oncology

## 2024-11-10 DIAGNOSIS — D472 Monoclonal gammopathy: Secondary | ICD-10-CM

## 2024-11-10 LAB — UPEP/TP, 24-HR URINE
Albumin, U: 43.5 %
Alpha 1, Urine: 2.2 %
Alpha 2, Urine: 12.6 %
Beta, Urine: 17.1 %
Gamma Globulin, Urine: 24.5 %
M-Spike, mg/24 hr: 16.9 mg/(24.h) — ABNORMAL HIGH
M-spike, %: 7.6 % — ABNORMAL HIGH
Total Protein, Urine-Ur/day: 222 mg/(24.h) — ABNORMAL HIGH (ref 30–150)
Total Protein, Urine: 7.4 mg/dL
Total Volume: 3000

## 2024-11-13 NOTE — Progress Notes (Signed)
 Appointment made for labs by schedulers up front and patient is aware.

## 2024-11-15 ENCOUNTER — Inpatient Hospital Stay: Attending: Oncology

## 2024-11-15 DIAGNOSIS — Z79899 Other long term (current) drug therapy: Secondary | ICD-10-CM | POA: Insufficient documentation

## 2024-11-15 DIAGNOSIS — D472 Monoclonal gammopathy: Secondary | ICD-10-CM | POA: Insufficient documentation

## 2024-11-15 DIAGNOSIS — N1832 Chronic kidney disease, stage 3b: Secondary | ICD-10-CM | POA: Insufficient documentation

## 2024-11-15 DIAGNOSIS — D751 Secondary polycythemia: Secondary | ICD-10-CM | POA: Insufficient documentation

## 2024-11-15 LAB — CBC WITH DIFFERENTIAL/PLATELET
Abs Immature Granulocytes: 0.04 K/uL (ref 0.00–0.07)
Basophils Absolute: 0 K/uL (ref 0.0–0.1)
Basophils Relative: 0 %
Eosinophils Absolute: 0.2 K/uL (ref 0.0–0.5)
Eosinophils Relative: 3 %
HCT: 53 % — ABNORMAL HIGH (ref 39.0–52.0)
Hemoglobin: 18 g/dL — ABNORMAL HIGH (ref 13.0–17.0)
Immature Granulocytes: 1 %
Lymphocytes Relative: 17 %
Lymphs Abs: 1.5 K/uL (ref 0.7–4.0)
MCH: 31.5 pg (ref 26.0–34.0)
MCHC: 34 g/dL (ref 30.0–36.0)
MCV: 92.8 fL (ref 80.0–100.0)
Monocytes Absolute: 0.9 K/uL (ref 0.1–1.0)
Monocytes Relative: 10 %
Neutro Abs: 6 K/uL (ref 1.7–7.7)
Neutrophils Relative %: 69 %
Platelets: 166 K/uL (ref 150–400)
RBC: 5.71 MIL/uL (ref 4.22–5.81)
RDW: 12.7 % (ref 11.5–15.5)
WBC: 8.6 K/uL (ref 4.0–10.5)
nRBC: 0 % (ref 0.0–0.2)

## 2024-11-15 LAB — COMPREHENSIVE METABOLIC PANEL WITH GFR
ALT: 16 U/L (ref 0–44)
AST: 21 U/L (ref 15–41)
Albumin: 4.5 g/dL (ref 3.5–5.0)
Alkaline Phosphatase: 92 U/L (ref 38–126)
Anion gap: 13 (ref 5–15)
BUN: 29 mg/dL — ABNORMAL HIGH (ref 8–23)
CO2: 23 mmol/L (ref 22–32)
Calcium: 9.4 mg/dL (ref 8.9–10.3)
Chloride: 103 mmol/L (ref 98–111)
Creatinine, Ser: 1.82 mg/dL — ABNORMAL HIGH (ref 0.61–1.24)
GFR, Estimated: 38 mL/min — ABNORMAL LOW (ref >60)
Glucose, Bld: 197 mg/dL — ABNORMAL HIGH (ref 70–99)
Potassium: 4.4 mmol/L (ref 3.5–5.1)
Sodium: 138 mmol/L (ref 135–145)
Total Bilirubin: 0.7 mg/dL (ref 0.0–1.2)
Total Protein: 7.3 g/dL (ref 6.5–8.1)

## 2024-11-16 ENCOUNTER — Ambulatory Visit: Payer: Self-pay | Admitting: Hematology and Oncology

## 2024-12-01 ENCOUNTER — Inpatient Hospital Stay

## 2024-12-01 ENCOUNTER — Inpatient Hospital Stay: Admitting: Oncology

## 2024-12-01 VITALS — BP 138/98 | HR 80 | Temp 97.9°F | Resp 16 | Wt 208.8 lb

## 2024-12-01 DIAGNOSIS — D472 Monoclonal gammopathy: Secondary | ICD-10-CM

## 2024-12-01 DIAGNOSIS — D45 Polycythemia vera: Secondary | ICD-10-CM

## 2024-12-01 LAB — CBC WITH DIFFERENTIAL/PLATELET
Abs Immature Granulocytes: 0.04 K/uL (ref 0.00–0.07)
Basophils Absolute: 0 K/uL (ref 0.0–0.1)
Basophils Relative: 0 %
Eosinophils Absolute: 0.1 K/uL (ref 0.0–0.5)
Eosinophils Relative: 1 %
HCT: 55 % — ABNORMAL HIGH (ref 39.0–52.0)
Hemoglobin: 18.5 g/dL — ABNORMAL HIGH (ref 13.0–17.0)
Immature Granulocytes: 0 %
Lymphocytes Relative: 17 %
Lymphs Abs: 1.7 K/uL (ref 0.7–4.0)
MCH: 30.9 pg (ref 26.0–34.0)
MCHC: 33.6 g/dL (ref 30.0–36.0)
MCV: 91.8 fL (ref 80.0–100.0)
Monocytes Absolute: 0.9 K/uL (ref 0.1–1.0)
Monocytes Relative: 9 %
Neutro Abs: 7.2 K/uL (ref 1.7–7.7)
Neutrophils Relative %: 73 %
Platelets: 200 K/uL (ref 150–400)
RBC: 5.99 MIL/uL — ABNORMAL HIGH (ref 4.22–5.81)
RDW: 12.4 % (ref 11.5–15.5)
WBC: 9.9 K/uL (ref 4.0–10.5)
nRBC: 0 % (ref 0.0–0.2)

## 2024-12-01 NOTE — Progress Notes (Signed)
 HEMATOLOGY-ONCOLOGY PROGRESS NOTE  ASSESSMENT AND PLAN: 1.  Monoclonal gammopathy of uncertain significance. Lab results were discussed in detail with the patient and his daughter-in-law today.  Serum protein electrophoresis did not show an M spike but the urine protein electrophoresis showed a mildly increased M spike.  Light chains were elevated but the light chain ratio was normal.  The patient has known chronic kidney disease which is likely the cause of the elevated light chains.  The patient does not have any anemia, hypercalcemia, or known bone lesions.  We discussed that a small percentage of people can progress to multiple myeloma.  However his prognosis by QxMD gives him an MGUS score of 1 which is a low-intermediate risk and the absolute risk of progression to multiple myeloma or a lymphoproliferative disorder at 20 years is about 21%.  Recommend continued observation with repeat myeloma labs in approximately 3 months.  2.  Polycythemia. The patient has a longstanding history of polycythemia.  When he initially presented to us , he appeared to be anemic but this was based on lab work from last year following a GI bleed.  Today we discussed potential causes for polycythemia including a primary bone marrow issue such as polycythemia vera versus a secondary cause such as underlying cardiopulmonary disease such as sleep apnea, tobacco use, erythropoietin producing tumor, testosterone  use.  He is not a smoker and does not use testosterone .  Will perform additional workup today including a repeat CBC, erythropoietin level, JAK2 testing.  Will make further recommendations pending results.  May need to consider therapeutic phlebotomy if his hematocrit remains above 55%.  3.  Stage IIIb chronic kidney disease. Most recent creatinine was 1.82 with an eGFR of 38.  Continue to follow-up with nephrology.  Follow-up: Will be for an office visit in about 1 month to review the results of today's lab  testing.  SUBJECTIVE: Mr. Clos is seen today for routine follow-up for MGUS.  Initial evaluation was completed on 11/06/2024.  Lab work from that day showed a WBC of 9.1, hemoglobin 17.7, MCV 93.4, platelets 210,000.  His creatinine was 1.96 with an eGFR of 34.  Calcium  level was normal at 9.7 with a normal albumin of 4.6.  Serum protein electrophoresis did not show an M spike and the kappa free light chains were elevated at 26.8 and lambda free light chains were elevated at 37.4 however the kappa/lambda light chain ratio was normal at 0.72.  Immunoglobulins were normal and beta-2  microglobulin was normal at 2.4.  Urine protein electrophoresis showed an M spike of 7.6.  The patient was previously anemic but on initial evaluation in our office he now has polycythemia.   The patient presented to the office today accompanied by his daughter-in-law.  The patient tells me that he previously had an elevated hemoglobin prior to last year.  However, in 2024 he developed a GI bleed and was subsequently anemic.  He has no specific complaints today with the exception of mild fatigue.  REVIEW OF SYSTEMS:   Review of Systems  Constitutional:  Positive for malaise/fatigue. Negative for chills and fever.  HENT: Negative.    Eyes: Negative.   Respiratory: Negative.  Negative for cough and shortness of breath.   Cardiovascular: Negative.  Negative for chest pain and leg swelling.  Gastrointestinal: Negative.  Negative for abdominal pain, nausea and vomiting.  Genitourinary: Negative.   Musculoskeletal: Negative.   Skin: Negative.   Neurological: Negative.   Endo/Heme/Allergies: Negative.   Psychiatric/Behavioral: Negative.  I have reviewed the past medical history, past surgical history, social history and family history with the patient and they are unchanged from previous note.   PHYSICAL EXAMINATION:  Vitals:   12/01/24 1031 12/01/24 1036  BP: (!) 153/93 (!) 138/98  Pulse: 80   Resp: 16    Temp: 97.9 F (36.6 C)   SpO2: 97%    Filed Weights   12/01/24 1031  Weight: 208 lb 12.4 oz (94.7 kg)    Physical Exam Vitals reviewed.  Constitutional:      General: He is not in acute distress. HENT:     Head: Normocephalic.  Eyes:     General: No scleral icterus.    Conjunctiva/sclera: Conjunctivae normal.  Cardiovascular:     Rate and Rhythm: Normal rate. Rhythm irregular.  Pulmonary:     Effort: Pulmonary effort is normal. No respiratory distress.     Breath sounds: Normal breath sounds.  Abdominal:     General: Bowel sounds are normal. There is no distension.     Palpations: Abdomen is soft.  Musculoskeletal:        General: Normal range of motion.     Right lower leg: No edema.     Left lower leg: No edema.  Lymphadenopathy:     Cervical: No cervical adenopathy.  Skin:    General: Skin is warm and dry.  Neurological:     Mental Status: He is alert and oriented to person, place, and time.  Psychiatric:        Mood and Affect: Mood normal.        Behavior: Behavior normal.        Thought Content: Thought content normal.        Judgment: Judgment normal.     LABORATORY DATA:  I have reviewed the data as listed    Latest Ref Rng & Units 11/15/2024    9:41 AM 11/06/2024   12:00 PM 11/23/2023   10:04 AM  CMP  Glucose 70 - 99 mg/dL 802  796  849   BUN 8 - 23 mg/dL 29  36  20   Creatinine 0.61 - 1.24 mg/dL 8.17  8.03  8.29   Sodium 135 - 145 mmol/L 138  141  144   Potassium 3.5 - 5.1 mmol/L 4.4  5.2  4.4   Chloride 98 - 111 mmol/L 103  103  106   CO2 22 - 32 mmol/L 23  26  21    Calcium  8.9 - 10.3 mg/dL 9.4  9.7  9.1   Total Protein 6.5 - 8.1 g/dL 7.3  7.5    Total Bilirubin 0.0 - 1.2 mg/dL 0.7  0.8    Alkaline Phos 38 - 126 U/L 92  92    AST 15 - 41 U/L 21  19    ALT 0 - 44 U/L 16  17      Lab Results  Component Value Date   WBC 8.6 11/15/2024   HGB 18.0 (H) 11/15/2024   HCT 53.0 (H) 11/15/2024   MCV 92.8 11/15/2024   PLT 166 11/15/2024    NEUTROABS 6.0 11/15/2024    No results found for: CEA1, CEA, CAN199, CA125, PSA1  No results found.   Future Appointments  Date Time Provider Department Center  12/01/2024 10:30 AM Hardie Allean SAUNDERS, NP [facility] None  12/12/2024  3:40 PM Debera Jayson MATSU, MD [facility]     Allean Hardie, DNP, AGPCNP-BC, AOCNP 12/01/2024

## 2024-12-03 LAB — ERYTHROPOIETIN: Erythropoietin: 16.8 m[IU]/mL (ref 2.6–18.5)

## 2024-12-11 ENCOUNTER — Encounter: Payer: Self-pay | Admitting: *Deleted

## 2024-12-12 ENCOUNTER — Ambulatory Visit: Attending: Cardiology | Admitting: Cardiology

## 2024-12-12 ENCOUNTER — Encounter: Payer: Self-pay | Admitting: Cardiology

## 2024-12-12 VITALS — BP 136/88 | HR 68 | Ht 69.0 in | Wt 214.2 lb

## 2024-12-12 DIAGNOSIS — E782 Mixed hyperlipidemia: Secondary | ICD-10-CM

## 2024-12-12 DIAGNOSIS — I10 Essential (primary) hypertension: Secondary | ICD-10-CM

## 2024-12-12 DIAGNOSIS — I4821 Permanent atrial fibrillation: Secondary | ICD-10-CM | POA: Diagnosis not present

## 2024-12-12 DIAGNOSIS — R0602 Shortness of breath: Secondary | ICD-10-CM | POA: Diagnosis not present

## 2024-12-12 NOTE — Progress Notes (Signed)
"  ° ° °  Cardiology Office Note  Date: 12/12/2024   ID: Daxtyn, Rottenberg [birth year], MRN [id number]  History of Present Illness: Max Davenport is a 78 y.o. male last seen in June.  He is here for a follow-up visit.  Reports no palpitations or chest pain, but continues to complain of chronic exertional fatigue and lack of stamina.  He does try to exercise some each day at home using his [employer].  We went over his medications, he reports compliance with therapy.  No obvious bleeding problems on Xarelto .  I reviewed his lab work.  I reviewed his ECG today which shows atrial fibrillation at 68 bpm, PVC, nonspecific ST changes.  Physical Exam: VS:  BP 136/88   Pulse 68   Ht 5' 9 (1.753 m)   Wt 214 lb 3.2 oz (97.2 kg)   SpO2 98%   BMI 31.63 kg/m , BMI Body mass index is 31.63 kg/m.  Wt Readings from Last 3 Encounters:  12/12/24 214 lb 3.2 oz (97.2 kg)  12/01/24 208 lb 12.4 oz (94.7 kg)  11/06/24 206 lb (93.4 kg)    General: Patient appears comfortable at rest. HEENT: Conjunctiva and lids normal. Neck: Supple, no elevated JVP or carotid bruits. Lungs: Clear to auscultation, nonlabored breathing at rest. Cardiac: Irregularly irregular, 1/6 systolic murmur, no gallop.  ECG:  An ECG dated 10/12/2023 was personally reviewed today and demonstrated:  Atrial fibrillation with decreased R wave progression.  Labwork: November 2025: Cholesterol 148, triglycerides 132, HDL 53, LDL 72 11/15/2024: ALT 16; AST 21; BUN 29; Creatinine, Ser 1.82; Potassium 4.4; Sodium 138 12/01/2024: Hemoglobin 18.5; Platelets 200   Other Studies Reviewed Today:  No interval cardiac testing for review today.  Assessment and Plan:  1.  Permanent atrial fibrillation with CHA2DS2-VASc score of 3.  Heart rate well-controlled on Toprol -XL 25 mg daily.  Continue Xarelto  15 mg daily for stroke prophylaxis.  Creatinine clearance 46.   2.  Exertional fatigue, no definite angina.  Lexiscan  Myoview  in October 2024 was  low risk with no evidence of ischemia and LVEF 61%.  Plan to update echocardiogram to ensure no structural changes.   4.  Primary hypertension.  Currently on Cozaar  50 mg daily in addition to the above.   5.  Mixed hyperlipidemia.  LDL 72 and HDL 53 in November.  Continue Lipitor 40 mg daily and Zetia  10 mg daily.   6.  CKD stage IIIb, creatinine 1.82 with GFR 38 in December.  Disposition:  Follow up 6 months.  Signed, Jayson JUDITHANN Sierras, M.D., F.A.C.C. [facility]

## 2024-12-12 NOTE — Patient Instructions (Signed)
 Medication Instructions:   Continue all current medications.   Labwork:  none  Testing/Procedures:  Your physician has requested that you have an echocardiogram. Echocardiography is a painless test that uses sound waves to create images of your heart. It provides your doctor with information about the size and shape of your heart and how well your heart's chambers and valves are working. This procedure takes approximately one hour. There are no restrictions for this procedure. Please do NOT wear cologne, perfume, aftershave, or lotions (deodorant is allowed). Please arrive 15 minutes prior to your appointment time.  Please note: We ask at that you not bring children with you during ultrasound (echo/ vascular) testing. Due to room size and safety concerns, children are not allowed in the ultrasound rooms during exams. Our front office staff cannot provide observation of children in our lobby area while testing is being conducted. An adult accompanying a patient to their appointment will only be allowed in the ultrasound room at the discretion of the ultrasound technician under special circumstances. We apologize for any inconvenience.  Office will contact with results via phone, letter or mychart.     Follow-Up:  6 months   Any Other Special Instructions Will Be Listed Below (If Applicable).   If you need a refill on your cardiac medications before your next appointment, please call your pharmacy.

## 2024-12-19 LAB — CALR +MPL + E12-E15  (REFLEX)

## 2024-12-19 LAB — JAK2 V617F RFX CALR/MPL/E12-15

## 2024-12-21 ENCOUNTER — Ambulatory Visit: Attending: Cardiology

## 2024-12-21 DIAGNOSIS — R0602 Shortness of breath: Secondary | ICD-10-CM | POA: Diagnosis not present

## 2024-12-22 ENCOUNTER — Other Ambulatory Visit: Payer: Self-pay | Admitting: Cardiology

## 2024-12-22 ENCOUNTER — Ambulatory Visit: Payer: Self-pay | Admitting: Cardiology

## 2024-12-22 LAB — ECHOCARDIOGRAM COMPLETE
AR max vel: 2.06 cm2
AV Peak grad: 12.3 mmHg
AV Vena cont: 0.5 cm
Ao pk vel: 1.75 m/s
Calc EF: 70.7 %
P 1/2 time: 379 ms
S' Lateral: 2.7 cm
Single Plane A2C EF: 75.7 %
Single Plane A4C EF: 62.6 %

## 2024-12-22 NOTE — Telephone Encounter (Signed)
 Prescription refill request for Xarelto  received.  Indication:afib Last office visit:12/25 Weight:97.2  kg Age:79 Scr:1.82  12/25 CrCl:45.99  ml/min  Prescription refilled

## 2024-12-25 ENCOUNTER — Telehealth: Payer: Self-pay

## 2024-12-25 ENCOUNTER — Other Ambulatory Visit

## 2024-12-25 NOTE — Patient Outreach (Signed)
 Complex Care Management   Visit Note  12/25/2024  Name:  Max Davenport MRN: [id number] DOB: 12/19/1945  Situation: Referral received for Complex Care Management related to Diabetes with Complications and fatigue I obtained verbal consent from Patient.  Visit completed with Patient  on the phone  Background:   Past Medical History:  Diagnosis Date   Atrial fibrillation (HCC)    Dysrhythmia    Essential hypertension    Hyperlipidemia    Skin cancer, basal cell    Type 2 diabetes mellitus (HCC)     Assessment: Patient Reported Symptoms:  Cognitive Cognitive Status: Alert and oriented to person, place, and time, No symptoms reported Cognitive/Intellectual Conditions Management [RPT]: None reported or documented in medical history or problem list   Health Maintenance Behaviors: Annual physical exam, Exercise, Healthy diet Healing Pattern: Average Health Facilitated by: Healthy diet, Rest  Neurological Neurological Review of Symptoms: No symptoms reported Neurological Management Strategies: Coping strategies, Routine screening Neurological Self-Management Outcome: 4 (good)  HEENT HEENT Symptoms Reported: Other: (sinus congestion) HEENT Management Strategies: Coping strategies, Routine screening HEENT Self-Management Outcome: 4 (good)    Cardiovascular Cardiovascular Symptoms Reported: Fatigue, Irregular pulse Does patient have uncontrolled Hypertension?: No Cardiovascular Management Strategies: Adequate rest, Routine screening, Exercise Do You Have a Working Readable Scale?: Yes Weight: 208 lb (94.3 kg) (Home Scale) Cardiovascular Self-Management Outcome: 4 (good) Cardiovascular Comment: BP at home   Not as often as I should  Respiratory Respiratory Symptoms Reported: No symptoms reported Additional Respiratory Details: Mucines used for congestion Respiratory Management Strategies: Adequate rest, Routine screening Respiratory Self-Management Outcome: 4 (good)  Endocrine Is  patient diabetic?: Yes Is patient checking blood sugars at home?: Yes List most recent blood sugar readings, include date and time of day: 170 11/19 fasting; 168 11/16 Fasting Endocrine Self-Management Outcome: 3 (uncertain)  Gastrointestinal Gastrointestinal Symptoms Reported: Reflux/heartburn Additional Gastrointestinal Details: started taking fiber pills; stool softener Gastrointestinal Management Strategies: Adequate rest, Diet modification, Coping strategies Gastrointestinal Self-Management Outcome: 4 (good)    Genitourinary Genitourinary Symptoms Reported: Other Other Genitourinary Symptoms: Sometimes I mght be retaining some urine Genitourinary Management Strategies: Adequate rest, Activity, Coping strategies Genitourinary Self-Management Outcome: 4 (good)  Integumentary Integumentary Symptoms Reported: No symptoms reported Skin Management Strategies: Routine screening Skin Self-Management Outcome: 4 (good)  Musculoskeletal Musculoskelatal Symptoms Reviewed: Weakness Musculoskeletal Management Strategies: Adequate rest, Activity, Exercise, Medication therapy, Routine screening Musculoskeletal Self-Management Outcome: 4 (good) Falls in the past year?: No Number of falls in past year: 1 or less Was there an injury with Fall?: No Fall Risk Category Calculator: 0 Patient Fall Risk Level: Low Fall Risk Patient at Risk for Falls Due to: Impaired mobility Fall risk Follow up: Falls evaluation completed, Falls prevention discussed  Psychosocial Psychosocial Symptoms Reported: Sadness - if selected complete PHQ 2-9 Additional Psychological Details: Upset with current affairs ; news Behavioral Management Strategies: Adequate rest Behavioral Health Self-Management Outcome: 3 (uncertain) Major Change/Loss/Stressor/Fears (CP): Environment (Current events in the world) Techniques to [employer] with Loss/Stress/Change: Diversional activities Quality of Family Relationships: helpful, involved,  supportive Do you feel physically threatened by others?: No    12/25/2024    PHQ2-9 Depression Screening   Little interest or pleasure in doing things Not at all  Feeling down, depressed, or hopeless Not at all  PHQ-2 - Total Score 0  Trouble falling or staying asleep, or sleeping too much    Feeling tired or having little energy    Poor appetite or overeating     Feeling bad about yourself -  or that you are a failure or have let yourself or your family down    Trouble concentrating on things, such as reading the newspaper or watching television    Moving or speaking so slowly that other people could have noticed.  Or the opposite - being so fidgety or restless that you have been moving around a lot more than usual    Thoughts that you would be better off dead, or hurting yourself in some way    PHQ2-9 Total Score    If you checked off any problems, how difficult have these problems made it for you to do your work, take care of things at home, or get along with other people    Depression Interventions/Treatment      Today's Vitals   12/25/24 1458  BP: (!) 140/78  Pulse: (!) 6  Weight: 208 lb (94.3 kg)  Height: 5' 9 (1.753 m)   Pain Scale: 0-10 Pain Score: 0-No pain  Medications Reviewed Today     Reviewed by Kay Hendricks MATSU, RN (Case Manager) on 12/25/24 at 1452  Med List Status: <None>   Medication Order Taking? Sig Documenting Provider Last Dose Status Informant  acetaminophen  (TYLENOL ) 500 MG tablet [id number] Yes Take 500 mg by mouth every 6 (six) hours as needed for mild pain (pain score 1-3). [provider]  Active Child  atorvastatin  (LIPITOR) 40 MG tablet [id number] Yes Take 1 tablet(s) every day by oral route in the evening. [provider]  Active   dextromethorphan -guaiFENesin  (MUCINEX  DM) 30-600 MG 12hr tablet [id number] Yes Take 1 tablet by mouth 2 (two) times daily. Vann, Jessica U, DO  Active Child  ezetimibe  (ZETIA ) 10 MG tablet [id number] Yes  TAKE 1 TABLET BY MOUTH AT BEDTIME FOR CHOLESTEROL. Debera Jayson MATSU, MD  Active   FARXIGA 10 MG TABS tablet [id number] Yes Take 10 mg by mouth daily. [provider]  Active Child  Lactobacillus (PROBIOTIC ACIDOPHILUS PO) [id number] Yes Take 1 capsule by mouth daily. [provider]  Active Child  levocetirizine (XYZAL ) 5 MG tablet [id number] Yes Take 5 mg by mouth at bedtime. [provider]  Active Child  losartan  (COZAAR ) 50 MG tablet [id number] Yes Take 1 tablet (50 mg total) by mouth daily. Hold if bp is low Ricky Fines, MD  Active   meclizine  (ANTIVERT ) 25 MG tablet [id number] Yes Take 25 mg by mouth 3 (three) times daily as needed for dizziness. [provider]  Active   metoprolol  succinate (TOPROL -XL) 25 MG 24 hr tablet [id number] Yes Take 25 mg by mouth daily. [provider]  Active   Multiple Vitamin (MULTIVITAMIN) tablet [id number] Yes Take 1 tablet by mouth daily. [provider]  Active Child  OZEMPIC, 0.25 OR 0.5 MG/DOSE, 2 MG/3ML SOPN [id number] Yes Inject 0.5 mg every week by subcutaneous route for 90 days, for diabetes. [provider]  Active   pantoprazole  (PROTONIX ) 40 MG tablet [id number] Yes Take 1 tablet (40 mg total) by mouth daily before breakfast. Start after you complete your 3 months of twice daily dosing. Ezzard Sonny RAMAN, PA-C  Active   tamsulosin  (FLOMAX ) 0.4 MG CAPS capsule [id number] Yes Take 0.4 mg by mouth daily. [provider]  Active   XARELTO  15 MG TABS tablet [id number] Yes TAKE (1) TABLET BY MOUTH ONCE DAILY WITH SUPPER. Debera Jayson MATSU, MD  Active             Recommendation:   Continue Current Plan of Care  Follow  Up Plan:   Telephone follow-up in 1 month  Hendricks Her RN, BSN  [place] [facility] RN Case Information Systems Manager [phone]

## 2024-12-25 NOTE — Patient Instructions (Signed)
 Visit Information  Thank you for taking time to visit with me today. Please don't hesitate to contact me if I can be of assistance to you before our next scheduled appointment.  Your next care management appointment is by telephone on 01/09/2025 at 2:30 PM   Telephone follow-up in 1 month  Please call the care guide team at [phone] if you need to cancel, schedule, or reschedule an appointment.   Please call the Suicide and Crisis Lifeline: 988 call the [place]  National Suicide Prevention Lifeline: 6602293033 or TTY: (425)620-9124 TTY (984) 286-2232) to talk to a trained counselor call 1-800-273-TALK (toll free, 24 hour hotline) call the [facility]: [phone] call 911 if you are experiencing a Mental Health or Behavioral Health Crisis or need someone to talk to.  Hendricks Her RN, BSN   I [facility] RN Case Information Systems Manager [phone]

## 2024-12-27 ENCOUNTER — Ambulatory Visit

## 2025-01-02 NOTE — Progress Notes (Signed)
 " [facility] at [facility]  HEMATOLOGY FOLLOW-UP VISIT  Shona Norleen PEDLAR, MD  REASON FOR FOLLOW-UP: MGUS and polycythemia  ASSESSMENT & PLAN:  Patient is a 79 y.o. male following for MGUS and polycythemia  Assessment and Plan Assessment & Plan Secondary polycythemia Chronic elevated hemoglobin with normal erythropoietin  suggests secondary etiology, likely hypoxia from sleep-disordered breathing. Mild thrombotic risk does not require phlebotomy currently. Management depends on sleep study and hemoglobin trends. Negative JAK2/CALR/MPL testing  - Ordered sleep study to evaluate for sleep apnea. - Follow-up in two months with repeat blood work to reassess hemoglobin. - Therapeutic phlebotomy considered if sleep study is unrevealing and hemoglobin remains elevated.  Return to clinic in 2 months with labs  Suspected sleep apnea Chronic non-restorative sleep, daytime fatigue, morning tiredness, and nasal congestion suggest sleep apnea despite absence of witnessed apneas or snoring. Evaluation warranted due to association with secondary polycythemia.  - Ordered referral for sleep study  Monoclonal gammopathy of undetermined significance (MGUS) MGUS is a pre-malignant plasma cell disorder with low risk of progression to multiple myeloma. Surveillance is appropriate.  - Planned laboratory evaluation every 6 to 12 months to monitor for progression.  CKD Follows with Dr.Bhutani     Orders Placed This Encounter  Procedures   CBC with Differential/Platelet    Standing Status:   Future    Expected Date:   02/26/2025    Expiration Date:   05/27/2025   Comprehensive metabolic panel with GFR    Standing Status:   Future    Expected Date:   02/26/2025    Expiration Date:   05/27/2025   Iron and TIBC    Standing Status:   Future    Expected Date:   02/26/2025    Expiration Date:   05/27/2025    The total time spent in the appointment was 20 minutes encounter with  patients including review of chart and various tests results, discussions about plan of care and coordination of care plan   All questions were answered. The patient knows to call the clinic with any problems, questions or concerns. No barriers to learning was detected.  Mickiel Dry, MD 1/21/20262:56 PM   SUMMARY OF HEMATOLOGIC HISTORY:  MGUS: -09/25/2024: UPEP: M-spike at 30.7 mg/24 hr. Total protein at 267 mg/24 hr. Urine IFE was Bence Jones Protein positive; lambda type.  -09/25/2024: SPEP: M-spike not observed. No elevated immunuoglobulins. Immunofixationshows a faint band in IgA, light chain specificity could not be verified.  -09/25/2024: Creatinine 1.64. No anemia. No hypercalcemia. Uric acid normal.  -11/06/2024: SPEP: M-spike not observed. Beta globulin elevated at 1.5. Normal FLC ratio. Normal immunoglobulins. Immunofixation shows an increase in the beta fraction which may be due to increases in transferrin, beta-lipoprotein (hypercholesterolemia), or immunoglobulins, as seen in polyclonal or monoclonal gammopathies.  -11/06/2024: UPEP: M-spike of 16.9 mg/24 hr and total protein of 222 mg/24 hr.  -11/06/2024: HGB- 17.7. Creatinine 1.96. Beta-2  microglobulin normal.     Polycythemia: Patient was anemic in 2024 transitioned gradually to polycythemia on 11/06/2024  -12/14/2023: HGB- 12.8 -01/28/2024: HGB- 15.3 -05/04/2024: HGB- 17.4. HCT- 52.6.  -08/08/2024: HGB- 17.3. HCT- 52.5. -09/25/2024: Ferritin, folate, vitamin B 12, and TIBC normal. (In [facility]) -09/25/2024: HGB- 17.6. HCT- 55.3.  -11/06/2024: HGB- 17.7. HCT- 53.6. -11/15/2024: CBC diff: HGB- 18.0 -12/01/2024: JAK2 negative -12/01/2024: Serum EPO normal.   INTERVAL HISTORY: Discussed the use of AI scribe software for clinical note transcription with the patient, who gave verbal consent to proceed.  History of  Present Illness Max Davenport is a 79 year old male with secondary polycythemia and MGUS who presents  for hematology follow-up . He is accompanied by his daughter in law today.   He has chronically elevated hemoglobin since 2022, with laboratory evaluation showing a normal erythropoietin  level. Hemoglobin levels have continued to rise. No therapeutic phlebotomy has been performed to date.  He experiences persistent sleep disturbance characterized by poor sleep quality, frequent tiredness upon awakening, and low energy throughout the day. He attributes some fatigue to prior COVID pneumonia. He does not snore, does not experience nocturnal gasping, and does not fall asleep during conversations or while driving, though he drives infrequently. He reports chronic nasal congestion from evening through morning. Various sleep medications have provided only transient benefit, and he continues to lack restorative sleep. He occasionally naps during the day but does not achieve deep sleep.   I have reviewed the past medical history, past surgical history, social history and family history with the patient   ALLERGIES:  is allergic to ativan  [lorazepam ].  MEDICATIONS:  Current Outpatient Medications  Medication Sig Dispense Refill   acetaminophen  (TYLENOL ) 500 MG tablet Take 500 mg by mouth every 6 (six) hours as needed for mild pain (pain score 1-3).     atorvastatin  (LIPITOR) 40 MG tablet Take 1 tablet(s) every day by oral route in the evening.     ezetimibe  (ZETIA ) 10 MG tablet TAKE 1 TABLET BY MOUTH AT BEDTIME FOR CHOLESTEROL. 90 tablet 3   FARXIGA 10 MG TABS tablet Take 10 mg by mouth daily.     Lactobacillus (PROBIOTIC ACIDOPHILUS PO) Take 1 capsule by mouth daily.     levocetirizine (XYZAL ) 5 MG tablet Take 5 mg by mouth at bedtime.     losartan  (COZAAR ) 50 MG tablet Take 1 tablet (50 mg total) by mouth daily. Hold if bp is low     meclizine  (ANTIVERT ) 25 MG tablet Take 25 mg by mouth 3 (three) times daily as needed for dizziness.     metoprolol  succinate (TOPROL -XL) 25 MG 24 hr tablet Take 25 mg by  mouth daily.     mirtazapine (REMERON) 7.5 MG tablet Take 7.5 mg by mouth at bedtime.     Multiple Vitamin (MULTIVITAMIN) tablet Take 1 tablet by mouth daily.     Oxymetazoline HCl (MUCINEX  NASAL SPRAY MOISTURE NA) Place into the nose.     OZEMPIC, 0.25 OR 0.5 MG/DOSE, 2 MG/3ML SOPN Inject 0.5 mg every week by subcutaneous route for 90 days, for diabetes.     pantoprazole  (PROTONIX ) 40 MG tablet Take 1 tablet (40 mg total) by mouth daily before breakfast. Start after you complete your 3 months of twice daily dosing. 90 tablet 3   tamsulosin  (FLOMAX ) 0.4 MG CAPS capsule Take 0.4 mg by mouth daily.     XARELTO  15 MG TABS tablet TAKE (1) TABLET BY MOUTH ONCE DAILY WITH SUPPER. 30 tablet 5   dextromethorphan -guaiFENesin  (MUCINEX  DM) 30-600 MG 12hr tablet Take 1 tablet by mouth 2 (two) times daily. (Patient not taking: Reported on 01/03/2025)     No current facility-administered medications for this visit.    PHYSICAL EXAMINATION:   Vitals:   01/03/25 0828 01/03/25 0832  BP: (!) 161/83 (!) 158/94  Pulse: (!) 56   Resp: 17   Temp: 98.2 F (36.8 C)   SpO2: 98%     GENERAL:alert, no distress and comfortable SKIN: skin color, texture, turgor are normal, no rashes or significant lesions LYMPH:  no palpable lymphadenopathy  in the cervical, axillary or inguinal LUNGS: clear to auscultation and percussion with normal breathing effort HEART: regular rate & rhythm and no murmurs and no lower extremity edema ABDOMEN:abdomen soft, non-tender and normal bowel sounds Musculoskeletal:no cyanosis of digits and no clubbing  NEURO: alert & oriented x 3 with fluent speech  LABORATORY DATA:  I have reviewed the data as listed  Lab Results  Component Value Date   WBC 9.9 12/01/2024   NEUTROABS 7.2 12/01/2024   HGB 18.5 (H) 12/01/2024   HCT 55.0 (H) 12/01/2024   MCV 91.8 12/01/2024   PLT 200 12/01/2024        Chemistry      Component Value Date/Time   NA 138 11/15/2024 0941   NA 144  11/23/2023 1004   K 4.4 11/15/2024 0941   CL 103 11/15/2024 0941   CO2 23 11/15/2024 0941   BUN 29 (H) 11/15/2024 0941   BUN 20 11/23/2023 1004   CREATININE 1.82 (H) 11/15/2024 0941   CREATININE 1.48 (H) 04/07/2021 0943      Component Value Date/Time   CALCIUM  9.4 11/15/2024 0941   ALKPHOS 92 11/15/2024 0941   AST 21 11/15/2024 0941   ALT 16 11/15/2024 0941   BILITOT 0.7 11/15/2024 0941       Latest Reference Range & Units 12/01/24 11:14  Erythropoietin  2.6 - 18.5 mIU/mL 16.8    RADIOGRAPHIC STUDIES: I have personally reviewed the radiological images as listed and agreed with the findings in the report.  ECHOCARDIOGRAM COMPLETE    ECHOCARDIOGRAM REPORT       Patient Name:   JAYME CHAM Niehaus Date of Exam: 12/21/2024 Medical Rec #:  [id number]     Height:       69.0 in Accession #:    [id number]    Weight:       214.2 lb Date of Birth:  May 31, 1946     BSA:          2.127 m Patient Age:    78 years      BP:           136/88 mmHg Patient Gender: M             HR:           74 bpm. Exam Location:  [place]  Procedure: 2D Echo, Cardiac Doppler and Color Doppler (Both Spectral and Color            Flow Doppler were utilized during procedure).  Indications:    R06.02 SOB   History:        Patient has prior history of Echocardiogram examinations, most                 recent 10/29/2020. CKD, Arrythmias:Atrial Fibrillation; Risk                 Factors:Hypertension, Dyslipidemia, Diabetes and Non-Smoker.   Sonographer:    Bascom Burows RCS, RVS Referring Phys: 22 SAMUEL G MCDOWELL  IMPRESSIONS   1. Left ventricular ejection fraction, by estimation, is 60 to 65%. The left ventricle has normal function. Left ventricular endocardial border not optimally defined to evaluate regional wall motion. Left ventricular diastolic function could not be  evaluated.  2. Right ventricular systolic function is normal. The right ventricular size is moderately enlarged. There is normal pulmonary  artery systolic pressure.  3. Left atrial size was mild to moderately dilated.  4. Right atrial size was severely dilated.  5. The mitral valve is abnormal.  Trivial mitral valve regurgitation. No evidence of mitral stenosis. The mean mitral valve gradient is 2.0 mmHg. Moderate mitral annular calcification.  6. The aortic valve is tricuspid. There is moderate calcification of the aortic valve. Aortic valve regurgitation is mild. No aortic stenosis is present. Aortic regurgitation PHT measures 379 msec.  7. The inferior vena cava is normal in size with greater than 50% respiratory variability, suggesting right atrial pressure of 3 mmHg.  Comparison(s): A prior study was performed on 10/29/2020. EF 65-70%. Bi-atrial moderate dilatation. Mild aortic and tricuspid regurgitation. Normal PASP. RVSP was 35.5 mmHg.  FINDINGS  Left Ventricle: Left ventricular ejection fraction, by estimation, is 60 to 65%. The left ventricle has normal function. Left ventricular endocardial border not optimally defined to evaluate regional wall motion. Strain was performed and the global  longitudinal strain is indeterminate. The left ventricular internal cavity size was normal in size. There is no left ventricular hypertrophy. Left ventricular diastolic function could not be evaluated due to atrial fibrillation. Left ventricular  diastolic function could not be evaluated.  Right Ventricle: The right ventricular size is moderately enlarged. No increase in right ventricular wall thickness. Right ventricular systolic function is normal. There is normal pulmonary artery systolic pressure. The tricuspid regurgitant velocity is  2.64 m/s, and with an assumed right atrial pressure of 3 mmHg, the estimated right ventricular systolic pressure is 30.9 mmHg.  Left Atrium: Left atrial size was mild to moderately dilated.  Right Atrium: Right atrial size was severely dilated.  Pericardium: There is no evidence of pericardial  effusion.  Mitral Valve: The mitral valve is abnormal. Moderate mitral annular calcification. Trivial mitral valve regurgitation. No evidence of mitral valve stenosis. MV peak gradient, 7.0 mmHg. The mean mitral valve gradient is 2.0 mmHg.  Tricuspid Valve: The tricuspid valve is normal in structure. Tricuspid valve regurgitation is mild . No evidence of tricuspid stenosis.  Aortic Valve: The aortic valve is tricuspid. There is moderate calcification of the aortic valve. Aortic valve regurgitation is mild. Aortic regurgitation PHT measures 379 msec. No aortic stenosis is present. Aortic valve peak gradient measures 12.2  mmHg.  Pulmonic Valve: The pulmonic valve was not well visualized. Pulmonic valve regurgitation is not visualized. No evidence of pulmonic stenosis.  Aorta: The aortic root is normal in size and structure.  Venous: The inferior vena cava is normal in size with greater than 50% respiratory variability, suggesting right atrial pressure of 3 mmHg.  IAS/Shunts: No atrial level shunt detected by color flow Doppler.  Additional Comments: 3D was performed not requiring image post processing on an independent workstation and was indeterminate.    LEFT VENTRICLE PLAX 2D LVIDd:         4.70 cm LVIDs:         2.70 cm LV PW:         1.00 cm LV IVS:        1.00 cm LVOT diam:     2.10 cm LVOT Area:     3.46 cm   LV Volumes (MOD) LV vol d, MOD A2C: 53.8 ml LV vol d, MOD A4C: 62.9 ml LV vol s, MOD A2C: 13.1 ml LV vol s, MOD A4C: 23.5 ml LV SV MOD A2C:     40.7 ml LV SV MOD A4C:     62.9 ml LV SV MOD BP:      42.0 ml  RIGHT VENTRICLE             IVC RV Basal diam:  4.10 cm     IVC diam: 1.80 cm RV Mid diam:    3.60 cm RV S prime:     14.80 cm/s  LEFT ATRIUM             Index        RIGHT ATRIUM           Index LA diam:        4.30 cm 2.02 cm/m   RA Area:     27.20 cm LA Vol (A2C):   57.2 ml 26.89 ml/m  RA Volume:   87.20 ml  40.99 ml/m LA Vol (A4C):   81.5 ml 38.31  ml/m LA Biplane Vol: 72.5 ml 34.08 ml/m  AORTIC VALVE                    PULMONIC VALVE AV Area (Vmax):    2.06 cm     PV Vmax:       1.36 m/s AV Vmax:           175.00 cm/s  PV Peak grad:  7.3 mmHg AV Peak Grad:      12.2 mmHg LVOT Vmax:         104.00 cm/s AI PHT:            379 msec AR Vena Contracta: 0.50 cm   AORTA Ao Root diam: 3.43 cm Ao Asc diam:  3.30 cm  MITRAL VALVE            TRICUSPID VALVE MV Peak grad: 7.0 mmHg  TR Peak grad:   27.9 mmHg MV Mean grad: 2.0 mmHg  TR Vmax:        264.00 cm/s MV Vmax:      1.32 m/s MV Vmean:     65.3 cm/s SHUNTS                         Systemic Diam: 2.10 cm  Vishnu Priya Mallipeddi Electronically signed by Diannah Late Mallipeddi Signature Date/Time: 12/22/2024/11:01:48 AM      Final       "

## 2025-01-03 ENCOUNTER — Inpatient Hospital Stay: Attending: Oncology | Admitting: Oncology

## 2025-01-03 VITALS — BP 158/94 | HR 56 | Temp 98.2°F | Resp 17 | Wt 214.6 lb

## 2025-01-03 DIAGNOSIS — Z79899 Other long term (current) drug therapy: Secondary | ICD-10-CM | POA: Diagnosis not present

## 2025-01-03 DIAGNOSIS — I1310 Hypertensive heart and chronic kidney disease without heart failure, with stage 1 through stage 4 chronic kidney disease, or unspecified chronic kidney disease: Secondary | ICD-10-CM | POA: Diagnosis not present

## 2025-01-03 DIAGNOSIS — E1122 Type 2 diabetes mellitus with diabetic chronic kidney disease: Secondary | ICD-10-CM | POA: Insufficient documentation

## 2025-01-03 DIAGNOSIS — Z7901 Long term (current) use of anticoagulants: Secondary | ICD-10-CM | POA: Diagnosis not present

## 2025-01-03 DIAGNOSIS — D45 Polycythemia vera: Secondary | ICD-10-CM

## 2025-01-03 DIAGNOSIS — I4891 Unspecified atrial fibrillation: Secondary | ICD-10-CM | POA: Diagnosis not present

## 2025-01-03 DIAGNOSIS — N189 Chronic kidney disease, unspecified: Secondary | ICD-10-CM | POA: Diagnosis not present

## 2025-01-03 DIAGNOSIS — Z7985 Long-term (current) use of injectable non-insulin antidiabetic drugs: Secondary | ICD-10-CM | POA: Insufficient documentation

## 2025-01-03 DIAGNOSIS — D751 Secondary polycythemia: Secondary | ICD-10-CM | POA: Diagnosis not present

## 2025-01-03 DIAGNOSIS — Z7984 Long term (current) use of oral hypoglycemic drugs: Secondary | ICD-10-CM | POA: Insufficient documentation

## 2025-01-03 DIAGNOSIS — D472 Monoclonal gammopathy: Secondary | ICD-10-CM | POA: Diagnosis not present

## 2025-01-04 ENCOUNTER — Other Ambulatory Visit (HOSPITAL_COMMUNITY): Payer: Self-pay | Admitting: Nephrology

## 2025-01-04 DIAGNOSIS — R809 Proteinuria, unspecified: Secondary | ICD-10-CM

## 2025-01-04 DIAGNOSIS — I129 Hypertensive chronic kidney disease with stage 1 through stage 4 chronic kidney disease, or unspecified chronic kidney disease: Secondary | ICD-10-CM

## 2025-01-04 DIAGNOSIS — N1832 Chronic kidney disease, stage 3b: Secondary | ICD-10-CM

## 2025-01-09 ENCOUNTER — Other Ambulatory Visit: Payer: Self-pay

## 2025-01-09 NOTE — Patient Outreach (Signed)
 Complex Care Management   Visit Note  01/09/2025  Name:  Max Davenport MRN: [id number] DOB: 1946/08/06  Situation: Referral received for Complex Care Management related to Diabetes with Complications and Fatigue I obtained verbal consent from Patient.  Visit completed with Patient  on the phone  Background:   Past Medical History:  Diagnosis Date   Atrial fibrillation (HCC)    Dysrhythmia    Essential hypertension    Hyperlipidemia    Skin cancer, basal cell    Type 2 diabetes mellitus (HCC)     Assessment: Patient Reported Symptoms:  Cognitive        Neurological Neurological Review of Symptoms: Dizziness (A little after 1st night of amlodipine   Eased off now) Neurological Management Strategies: Coping strategies, Routine screening Neurological Self-Management Outcome: 4 (good)  HEENT HEENT Symptoms Reported: Other: (sinus congestion) HEENT Management Strategies: Coping strategies, Routine screening HEENT Self-Management Outcome: 4 (good)    Cardiovascular Cardiovascular Symptoms Reported: Fainting, Irregular pulse Does patient have uncontrolled Hypertension?: No Cardiovascular Management Strategies: Adequate rest, Routine screening Do You Have a Working Readable Scale?: Yes Weight: 210 lb (95.3 kg) Cardiovascular Self-Management Outcome: 4 (good) Cardiovascular Comment: Now taking BP daily since starting amlodipine  Respiratory Respiratory Symptoms Reported: No symptoms reported Other Respiratory Symptoms: mucinex  Respiratory Management Strategies: Adequate rest, Routine screening Respiratory Self-Management Outcome: 4 (good)  Endocrine Endocrine Symptoms Reported: No symptoms reported Is patient diabetic?: Yes Is patient checking blood sugars at home?: Yes List most recent blood sugar readings, include date and time of day: 164 AM Fasting Endocrine Self-Management Outcome: 4 (good)  Gastrointestinal Gastrointestinal Symptoms Reported:  Reflux/heartburn Gastrointestinal Management Strategies: Adequate rest, Diet modification, Coping strategies Gastrointestinal Self-Management Outcome: 4 (good)    Genitourinary Other Genitourinary Symptoms: emptying is better  Had discussed with provider Genitourinary Management Strategies: Adequate rest, Coping strategies  Integumentary Integumentary Symptoms Reported: Bruising Skin Management Strategies: Routine screening Skin Self-Management Outcome: 4 (good)  Musculoskeletal Musculoskelatal Symptoms Reviewed: No symptoms reported Musculoskeletal Management Strategies: Coping strategies, Routine screening, Activity, Adequate rest Musculoskeletal Self-Management Outcome: 4 (good) Falls in the past year?: No Number of falls in past year: 1 or less Was there an injury with Fall?: No Fall Risk Category Calculator: 0 Patient Fall Risk Level: Low Fall Risk Patient at Risk for Falls Due to: History of fall(s), Impaired mobility Fall risk Follow up: Falls evaluation completed, Education provided, Falls prevention discussed  Psychosocial Psychosocial Symptoms Reported: No symptoms reported Behavioral Management Strategies: Adequate rest Behavioral Health Self-Management Outcome: 4 (good)   Quality of Family Relationships: helpful, involved, supportive Do you feel physically threatened by others?: No    01/09/2025    PHQ2-9 Depression Screening   Little interest or pleasure in doing things Not at all  Feeling down, depressed, or hopeless Not at all  PHQ-2 - Total Score 0  Trouble falling or staying asleep, or sleeping too much    Feeling tired or having little energy    Poor appetite or overeating     Feeling bad about yourself - or that you are a failure or have let yourself or your family down    Trouble concentrating on things, such as reading the newspaper or watching television    Moving or speaking so slowly that other people could have noticed.  Or the opposite - being so fidgety  or restless that you have been moving around a lot more than usual    Thoughts that you would be better off dead, or hurting yourself  in some way    PHQ2-9 Total Score    If you checked off any problems, how difficult have these problems made it for you to do your work, take care of things at home, or get along with other people    Depression Interventions/Treatment      Today's Vitals   01/09/25 1440  BP: 124/80  Pulse: 70  Weight: 210 lb (95.3 kg)  Height: 5' 9 (1.753 m)   Pain Scale: 0-10 Pain Score: 0-No pain  Medications Reviewed Today     Reviewed by Kay Hendricks MATSU, RN (Case Manager) on 01/09/25 at 1438  Med List Status: <None>   Medication Order Taking? Sig Documenting Provider Last Dose Status Informant  acetaminophen  (TYLENOL ) 500 MG tablet [id number] Yes Take 500 mg by mouth every 6 (six) hours as needed for mild pain (pain score 1-3). [provider]  Active Child  amLODipine (NORVASC) 5 MG tablet [id number] Yes Take 5 mg by mouth daily. [provider]  Active   atorvastatin  (LIPITOR) 40 MG tablet [id number] Yes Take 1 tablet(s) every day by oral route in the evening. [provider]  Active   dextromethorphan -guaiFENesin  (MUCINEX  DM) 30-600 MG 12hr tablet [id number]  Take 1 tablet by mouth 2 (two) times daily.  Patient not taking: Reported on 01/09/2025   Vann, Jessica U, DO  Consider Medication Status and Discontinue Child  ezetimibe  (ZETIA ) 10 MG tablet [id number] Yes TAKE 1 TABLET BY MOUTH AT BEDTIME FOR CHOLESTEROL. Debera Jayson MATSU, MD  Active   FARXIGA 10 MG TABS tablet [id number] Yes Take 10 mg by mouth daily. [provider]  Active Child  Lactobacillus (PROBIOTIC ACIDOPHILUS PO) [id number] Yes Take 1 capsule by mouth daily. [provider]  Active Child  levocetirizine (XYZAL ) 5 MG tablet [id number] Yes Take 5 mg by mouth at bedtime. [provider]  Active Child  losartan  (COZAAR ) 50 MG tablet [id number] Yes Take 1  tablet (50 mg total) by mouth daily. Hold if bp is low Ricky Fines, MD  Active   meclizine  (ANTIVERT ) 25 MG tablet [id number] Yes Take 25 mg by mouth 3 (three) times daily as needed for dizziness. [provider]  Active   metoprolol  succinate (TOPROL -XL) 25 MG 24 hr tablet [id number] Yes Take 25 mg by mouth daily. [provider]  Active   mirtazapine (REMERON) 7.5 MG tablet [id number] Yes Take 7.5 mg by mouth at bedtime. [provider]  Active   Multiple Vitamin (MULTIVITAMIN) tablet [id number] Yes Take 1 tablet by mouth daily. [provider]  Active Child  Oxymetazoline HCl (MUCINEX  NASAL SPRAY MOISTURE NA) [id number] Yes Place into the nose. [provider]  Active   OZEMPIC, 0.25 OR 0.5 MG/DOSE, 2 MG/3ML SOPN [id number] Yes Inject 0.5 mg every week by subcutaneous route for 90 days, for diabetes. [provider]  Active   pantoprazole  (PROTONIX ) 40 MG tablet [id number] Yes Take 1 tablet (40 mg total) by mouth daily before breakfast. Start after you complete your 3 months of twice daily dosing. Ezzard Sonny RAMAN, PA-C  Active   tamsulosin  (FLOMAX ) 0.4 MG CAPS capsule [id number] Yes Take 0.4 mg by mouth daily. [provider]  Active   XARELTO  15 MG TABS tablet [id number] Yes TAKE (1) TABLET BY MOUTH ONCE DAILY WITH SUPPER. Debera Jayson MATSU, MD  Active             Recommendation:   Continue Current Plan of Care  Follow Up Plan:   Telephone follow-up  in 1 month  Hendricks Her RN, BSN  [place] [facility] RN Case Information Systems Manager [phone]

## 2025-01-09 NOTE — Patient Instructions (Signed)
 Visit Information  Thank you for taking time to visit with me today. Please don't hesitate to contact me if I can be of assistance to you before our next scheduled appointment.  Your next care management appointment is by telephone on 02/08/2025 at 2:00 PM   Telephone follow-up in 1 month  Please call the care guide team at [phone] if you need to cancel, schedule, or reschedule an appointment.   Please call the Suicide and Crisis Lifeline: 988 call the [place]  National Suicide Prevention Lifeline: 445-711-1942 or TTY: (256)451-0504 TTY 8724347649) to talk to a trained counselor call 1-800-273-TALK (toll free, 24 hour hotline) call the [facility]: [phone] call 911 if you are experiencing a Mental Health or Behavioral Health Crisis or need someone to talk to.  Hendricks Her RN, BSN  [place] [facility] RN Case Information Systems Manager [phone]

## 2025-01-12 ENCOUNTER — Other Ambulatory Visit: Payer: Self-pay | Admitting: Gastroenterology

## 2025-01-12 NOTE — Telephone Encounter (Signed)
 Last seen 11/2023. He should be on once daily pantoprazole  and we have not sent in RX anytime recently. I am going to give rx for once daily. For further refills, he needs follow up.

## 2025-01-15 ENCOUNTER — Ambulatory Visit (HOSPITAL_COMMUNITY): Admission: RE | Admit: 2025-01-15 | Source: Ambulatory Visit

## 2025-01-19 NOTE — Telephone Encounter (Signed)
 Noted. He can always obtain refills from his PCP as he likely sees them more often.

## 2025-01-24 ENCOUNTER — Ambulatory Visit (HOSPITAL_COMMUNITY)

## 2025-02-08 ENCOUNTER — Telehealth

## 2025-02-23 ENCOUNTER — Inpatient Hospital Stay: Attending: Oncology

## 2025-03-02 ENCOUNTER — Inpatient Hospital Stay: Admitting: Oncology

## 2025-06-04 ENCOUNTER — Ambulatory Visit: Admitting: Cardiology
